# Patient Record
Sex: Female | Born: 1950 | Race: White | Hispanic: No | Marital: Single | Smoking: Never smoker
Health system: Southern US, Community
[De-identification: ages and names within clinical notes are randomized; demographics above are authoritative.]

## PROBLEM LIST (undated history)

## (undated) DIAGNOSIS — J45909 Unspecified asthma, uncomplicated: Secondary | ICD-10-CM

## (undated) DIAGNOSIS — F329 Major depressive disorder, single episode, unspecified: Secondary | ICD-10-CM

## (undated) DIAGNOSIS — R55 Syncope and collapse: Secondary | ICD-10-CM

## (undated) DIAGNOSIS — R9431 Abnormal electrocardiogram [ECG] [EKG]: Secondary | ICD-10-CM

## (undated) DIAGNOSIS — I10 Essential (primary) hypertension: Secondary | ICD-10-CM

## (undated) DIAGNOSIS — I639 Cerebral infarction, unspecified: Secondary | ICD-10-CM

## (undated) DIAGNOSIS — G4730 Sleep apnea, unspecified: Secondary | ICD-10-CM

## (undated) DIAGNOSIS — M1990 Unspecified osteoarthritis, unspecified site: Secondary | ICD-10-CM

## (undated) DIAGNOSIS — R569 Unspecified convulsions: Secondary | ICD-10-CM

## (undated) DIAGNOSIS — E119 Type 2 diabetes mellitus without complications: Secondary | ICD-10-CM

## (undated) DIAGNOSIS — D496 Neoplasm of unspecified behavior of brain: Secondary | ICD-10-CM

## (undated) DIAGNOSIS — F410 Panic disorder [episodic paroxysmal anxiety] without agoraphobia: Secondary | ICD-10-CM

## (undated) DIAGNOSIS — K219 Gastro-esophageal reflux disease without esophagitis: Secondary | ICD-10-CM

## (undated) DIAGNOSIS — F32A Depression, unspecified: Secondary | ICD-10-CM

## (undated) DIAGNOSIS — F419 Anxiety disorder, unspecified: Secondary | ICD-10-CM

## (undated) HISTORY — PX: KNEE SURGERY: SHX244

## (undated) HISTORY — PX: TRIGGER FINGER RELEASE: SHX641

## (undated) HISTORY — PX: CHOLECYSTECTOMY: SHX55

## (undated) HISTORY — PX: CSF SHUNT: SHX92

## (undated) HISTORY — PX: CARPAL TUNNEL RELEASE: SHX101

## (undated) HISTORY — PX: TONSILLECTOMY: SUR1361

---

## 2004-09-17 ENCOUNTER — Emergency Department: Payer: Self-pay | Admitting: Emergency Medicine

## 2004-11-18 ENCOUNTER — Other Ambulatory Visit: Payer: Self-pay

## 2005-01-25 ENCOUNTER — Inpatient Hospital Stay: Payer: Self-pay | Admitting: Unknown Physician Specialty

## 2005-01-28 ENCOUNTER — Encounter: Payer: Self-pay | Admitting: Internal Medicine

## 2005-04-26 ENCOUNTER — Ambulatory Visit: Payer: Self-pay

## 2007-05-23 ENCOUNTER — Ambulatory Visit: Payer: Self-pay

## 2009-01-07 ENCOUNTER — Ambulatory Visit: Payer: Self-pay

## 2012-04-14 ENCOUNTER — Emergency Department: Payer: Self-pay | Admitting: *Deleted

## 2012-04-14 LAB — COMPREHENSIVE METABOLIC PANEL
Albumin: 4.1 g/dL (ref 3.4–5.0)
Alkaline Phosphatase: 123 U/L (ref 50–136)
Calcium, Total: 9.7 mg/dL (ref 8.5–10.1)
Chloride: 103 mmol/L (ref 98–107)
EGFR (African American): >60
Potassium: 4.1 mmol/L (ref 3.5–5.1)
SGOT(AST): 42 U/L — ABNORMAL HIGH (ref 15–37)
SGPT (ALT): 38 U/L (ref 12–78)
Sodium: 138 mmol/L (ref 136–145)
Total Protein: 8.2 g/dL (ref 6.4–8.2)

## 2012-04-14 LAB — CBC WITH DIFFERENTIAL/PLATELET
Basophil #: 0 10*3/uL (ref 0.0–0.1)
Basophil %: 0.6 %
Eosinophil #: 0.3 10*3/uL (ref 0.0–0.7)
Eosinophil %: 4.1 %
HCT: 39.2 % (ref 35.0–47.0)
HGB: 13.1 g/dL (ref 12.0–16.0)
Lymphocyte #: 1.7 10*3/uL (ref 1.0–3.6)
MCH: 31 pg (ref 26.0–34.0)
MCHC: 33.3 g/dL (ref 32.0–36.0)
MCV: 93 fL (ref 80–100)
Monocyte #: 0.6 x10 3/mm (ref 0.2–0.9)
Monocyte %: 8.9 %
Neutrophil #: 4.5 10*3/uL (ref 1.4–6.5)
Neutrophil %: 62.6 %
RBC: 4.21 10*6/uL (ref 3.80–5.20)
WBC: 7.2 10*3/uL (ref 3.6–11.0)

## 2012-04-14 LAB — URINALYSIS, COMPLETE
Bacteria: NONE SEEN
Glucose,UR: NEGATIVE mg/dL (ref 0–75)
Ketone: NEGATIVE
Protein: NEGATIVE
Specific Gravity: 1.006 (ref 1.003–1.030)
WBC UR: 1 /HPF (ref 0–5)

## 2012-04-14 LAB — TROPONIN I: Troponin-I: <0.02 ng/mL

## 2012-04-14 LAB — LIPASE, BLOOD: Lipase: 181 U/L (ref 73–393)

## 2014-02-28 ENCOUNTER — Ambulatory Visit: Payer: Self-pay | Admitting: Internal Medicine

## 2014-06-03 ENCOUNTER — Ambulatory Visit: Payer: Self-pay | Admitting: Internal Medicine

## 2014-11-24 ENCOUNTER — Other Ambulatory Visit: Payer: Self-pay

## 2014-11-24 NOTE — Patient Outreach (Signed)
[facility]) Care Management  11/24/2014  Sharon Kirby 06/19/1951 [id number]   RN CM spoke with patient about a referral from [facility].  The referral stated patient needed assistance with finding a physician in [place] that accepts [employer].  Patient was going to a [facility] facility that no longer accepts her insurance.  Patient states that is not a problem now because her primary physician has made referrals to a urologist and orthopedic that accepts her insurance.  RN CM informed patient that she could call the customer service number on her insurance card for information on physicians that accept her insurance.  RN CM explained to patient about the services of [facility].  Patient has diabetes as a chronic conditions.  Patient states she knows how to self-manage her diabetes.  Patient decline the services of [facility].  RN CM will close this case and notify primary physician.    Maury Dus, RN, Ishmael Holter, [place] Telephonic Care Coordinator [phone]

## 2014-11-25 NOTE — Patient Outreach (Signed)
[facility]) Care Management  11/25/2014  Sharon Kirby 11/17/1950 [id number]   Received notification from Maury Dus, RN to close case due to consumer is eligible for program but refused to participate.  Ronnell Freshwater. [place] CM Assistant Phone: [phone] Fax: [phone]

## 2015-03-09 NOTE — Patient Outreach (Signed)
[name]) Care Management  03/09/2015  Sharon Kirby [birth year] [id number]   Referral from [place], assigned to Sherrin Daisy, [facility] for patient outreach.  Rahmon Heigl L. Aalijah Lanphere, [place] Care Management Assistant

## 2015-04-09 ENCOUNTER — Inpatient Hospital Stay
Admission: EM | Admit: 2015-04-09 | Discharge: 2015-04-13 | DRG: 300 | Disposition: A | Discharge status: Home / Self Care | Payer: Commercial Managed Care - HMO | Attending: Internal Medicine | Admitting: Internal Medicine

## 2015-04-09 ENCOUNTER — Encounter: Payer: Self-pay | Admitting: *Deleted

## 2015-04-09 DIAGNOSIS — E785 Hyperlipidemia, unspecified: Secondary | ICD-10-CM | POA: Diagnosis present

## 2015-04-09 DIAGNOSIS — F419 Anxiety disorder, unspecified: Secondary | ICD-10-CM | POA: Diagnosis present

## 2015-04-09 DIAGNOSIS — J45909 Unspecified asthma, uncomplicated: Secondary | ICD-10-CM | POA: Diagnosis present

## 2015-04-09 DIAGNOSIS — T39395A Adverse effect of other nonsteroidal anti-inflammatory drugs [NSAID], initial encounter: Secondary | ICD-10-CM | POA: Diagnosis present

## 2015-04-09 DIAGNOSIS — Z880 Allergy status to penicillin: Secondary | ICD-10-CM

## 2015-04-09 DIAGNOSIS — K5520 Angiodysplasia of colon without hemorrhage: Secondary | ICD-10-CM | POA: Diagnosis not present

## 2015-04-09 DIAGNOSIS — K219 Gastro-esophageal reflux disease without esophagitis: Secondary | ICD-10-CM | POA: Diagnosis present

## 2015-04-09 DIAGNOSIS — M1990 Unspecified osteoarthritis, unspecified site: Secondary | ICD-10-CM | POA: Diagnosis present

## 2015-04-09 DIAGNOSIS — D496 Neoplasm of unspecified behavior of brain: Secondary | ICD-10-CM | POA: Diagnosis present

## 2015-04-09 DIAGNOSIS — D509 Iron deficiency anemia, unspecified: Secondary | ICD-10-CM | POA: Insufficient documentation

## 2015-04-09 DIAGNOSIS — Z8673 Personal history of transient ischemic attack (TIA), and cerebral infarction without residual deficits: Secondary | ICD-10-CM | POA: Diagnosis not present

## 2015-04-09 DIAGNOSIS — G8929 Other chronic pain: Secondary | ICD-10-CM | POA: Diagnosis present

## 2015-04-09 DIAGNOSIS — M25512 Pain in left shoulder: Secondary | ICD-10-CM | POA: Diagnosis present

## 2015-04-09 DIAGNOSIS — Q2733 Arteriovenous malformation of digestive system vessel: Secondary | ICD-10-CM | POA: Diagnosis not present

## 2015-04-09 DIAGNOSIS — K2970 Gastritis, unspecified, without bleeding: Secondary | ICD-10-CM | POA: Diagnosis present

## 2015-04-09 DIAGNOSIS — K922 Gastrointestinal hemorrhage, unspecified: Secondary | ICD-10-CM | POA: Diagnosis present

## 2015-04-09 DIAGNOSIS — I10 Essential (primary) hypertension: Secondary | ICD-10-CM | POA: Diagnosis present

## 2015-04-09 DIAGNOSIS — E119 Type 2 diabetes mellitus without complications: Secondary | ICD-10-CM | POA: Diagnosis present

## 2015-04-09 DIAGNOSIS — F329 Major depressive disorder, single episode, unspecified: Secondary | ICD-10-CM | POA: Diagnosis present

## 2015-04-09 DIAGNOSIS — Z882 Allergy status to sulfonamides status: Secondary | ICD-10-CM | POA: Diagnosis not present

## 2015-04-09 DIAGNOSIS — K449 Diaphragmatic hernia without obstruction or gangrene: Secondary | ICD-10-CM | POA: Diagnosis present

## 2015-04-09 DIAGNOSIS — Z794 Long term (current) use of insulin: Secondary | ICD-10-CM | POA: Diagnosis not present

## 2015-04-09 DIAGNOSIS — G4733 Obstructive sleep apnea (adult) (pediatric): Secondary | ICD-10-CM | POA: Diagnosis present

## 2015-04-09 DIAGNOSIS — Z6841 Body Mass Index (BMI) 40.0 and over, adult: Secondary | ICD-10-CM

## 2015-04-09 DIAGNOSIS — R569 Unspecified convulsions: Secondary | ICD-10-CM | POA: Diagnosis present

## 2015-04-09 DIAGNOSIS — Z791 Long term (current) use of non-steroidal anti-inflammatories (NSAID): Secondary | ICD-10-CM | POA: Diagnosis not present

## 2015-04-09 DIAGNOSIS — F410 Panic disorder [episodic paroxysmal anxiety] without agoraphobia: Secondary | ICD-10-CM | POA: Diagnosis present

## 2015-04-09 DIAGNOSIS — D62 Acute posthemorrhagic anemia: Secondary | ICD-10-CM | POA: Diagnosis present

## 2015-04-09 DIAGNOSIS — D649 Anemia, unspecified: Secondary | ICD-10-CM | POA: Diagnosis not present

## 2015-04-09 DIAGNOSIS — Z9119 Patient's noncompliance with other medical treatment and regimen: Secondary | ICD-10-CM | POA: Diagnosis present

## 2015-04-09 DIAGNOSIS — D500 Iron deficiency anemia secondary to blood loss (chronic): Secondary | ICD-10-CM | POA: Diagnosis present

## 2015-04-09 HISTORY — DX: Gastro-esophageal reflux disease without esophagitis: K21.9

## 2015-04-09 HISTORY — DX: Type 2 diabetes mellitus without complications: E11.9

## 2015-04-09 HISTORY — DX: Cerebral infarction, unspecified: I63.9

## 2015-04-09 HISTORY — DX: Depression, unspecified: F32.A

## 2015-04-09 HISTORY — DX: Panic disorder (episodic paroxysmal anxiety): F41.0

## 2015-04-09 HISTORY — DX: Essential (primary) hypertension: I10

## 2015-04-09 HISTORY — DX: Neoplasm of unspecified behavior of brain: D49.6

## 2015-04-09 HISTORY — DX: Anxiety disorder, unspecified: F41.9

## 2015-04-09 HISTORY — DX: Abnormal electrocardiogram (ECG) (EKG): R94.31

## 2015-04-09 HISTORY — DX: Unspecified osteoarthritis, unspecified site: M19.90

## 2015-04-09 HISTORY — DX: Major depressive disorder, single episode, unspecified: F32.9

## 2015-04-09 HISTORY — DX: Unspecified convulsions: R56.9

## 2015-04-09 HISTORY — DX: Sleep apnea, unspecified: G47.30

## 2015-04-09 HISTORY — DX: Unspecified asthma, uncomplicated: J45.909

## 2015-04-09 HISTORY — DX: Syncope and collapse: R55

## 2015-04-09 LAB — BASIC METABOLIC PANEL
ANION GAP: 8 (ref 5–15)
BUN: 8 mg/dL (ref 6–20)
CALCIUM: 8.9 mg/dL (ref 8.9–10.3)
CO2: 25 mmol/L (ref 22–32)
Chloride: 104 mmol/L (ref 101–111)
Creatinine, Ser: 0.52 mg/dL (ref 0.44–1.00)
Glucose, Bld: 94 mg/dL (ref 65–99)
POTASSIUM: 4 mmol/L (ref 3.5–5.1)
SODIUM: 137 mmol/L (ref 135–145)

## 2015-04-09 LAB — GLUCOSE, CAPILLARY
GLUCOSE-CAPILLARY: 83 mg/dL (ref 65–99)
Glucose-Capillary: 145 mg/dL — ABNORMAL HIGH (ref 65–99)

## 2015-04-09 LAB — CBC WITH DIFFERENTIAL/PLATELET
BASOS ABS: 0.1 10*3/uL (ref 0–0.1)
BASOS PCT: 1 %
EOS ABS: 0.2 10*3/uL (ref 0–0.7)
EOS PCT: 4 %
HCT: 18.3 % — ABNORMAL LOW (ref 35.0–47.0)
Hemoglobin: 5.3 g/dL — ABNORMAL LOW (ref 12.0–16.0)
LYMPHS PCT: 23 %
Lymphs Abs: 1 10*3/uL (ref 1.0–3.6)
MCH: 19.1 pg — ABNORMAL LOW (ref 26.0–34.0)
MCHC: 28.8 g/dL — ABNORMAL LOW (ref 32.0–36.0)
MCV: 66.5 fL — ABNORMAL LOW (ref 80.0–100.0)
Monocytes Absolute: 0.7 10*3/uL (ref 0.2–0.9)
Monocytes Relative: 15 %
Neutro Abs: 2.6 10*3/uL (ref 1.4–6.5)
Neutrophils Relative %: 57 %
PLATELETS: 167 10*3/uL (ref 150–440)
RBC: 2.75 MIL/uL — AB (ref 3.80–5.20)
RDW: 21.1 % — AB (ref 11.5–14.5)
WBC: 4.5 10*3/uL (ref 3.6–11.0)

## 2015-04-09 LAB — ABO/RH: ABO/RH(D): O POS

## 2015-04-09 LAB — PREPARE RBC (CROSSMATCH)

## 2015-04-09 LAB — HEPATIC FUNCTION PANEL
ALT: 22 U/L (ref 14–54)
AST: 49 U/L — AB (ref 15–41)
Albumin: 3.7 g/dL (ref 3.5–5.0)
Alkaline Phosphatase: 134 U/L — ABNORMAL HIGH (ref 38–126)
BILIRUBIN DIRECT: 0.2 mg/dL (ref 0.1–0.5)
BILIRUBIN INDIRECT: 0.8 mg/dL (ref 0.3–0.9)
BILIRUBIN TOTAL: 1 mg/dL (ref 0.3–1.2)
Total Protein: 7.2 g/dL (ref 6.5–8.1)

## 2015-04-09 MED ORDER — INSULIN ASPART 100 UNIT/ML ~~LOC~~ SOLN
0.0000 [IU] | Freq: Three times a day (TID) | SUBCUTANEOUS | Status: DC
  Administered 2015-04-09: 1 [IU] via SUBCUTANEOUS
  Administered 2015-04-11: 2 [IU] via SUBCUTANEOUS
  Administered 2015-04-11 – 2015-04-13 (×4): 1 [IU] via SUBCUTANEOUS
  Filled 2015-04-09 (×2): qty 1
  Filled 2015-04-09: qty 2
  Filled 2015-04-09 (×4): qty 1

## 2015-04-09 MED ORDER — SODIUM CHLORIDE 0.9 % IV SOLN
10.0000 mL/h | Freq: Once | INTRAVENOUS | Status: AC
  Administered 2015-04-09: 10 mL/h via INTRAVENOUS

## 2015-04-09 MED ORDER — ONDANSETRON HCL 4 MG/2ML IJ SOLN
4.0000 mg | Freq: Four times a day (QID) | INTRAMUSCULAR | Status: DC | PRN
Start: 2015-04-09 — End: 2015-04-13
  Administered 2015-04-10 – 2015-04-12 (×2): 4 mg via INTRAVENOUS
  Filled 2015-04-09: qty 2

## 2015-04-09 MED ORDER — SENNOSIDES-DOCUSATE SODIUM 8.6-50 MG PO TABS
1.0000 | ORAL_TABLET | Freq: Every evening | ORAL | Status: DC | PRN
  Administered 2015-04-13: 1 via ORAL
  Filled 2015-04-09 (×2): qty 1

## 2015-04-09 MED ORDER — HYDROCODONE-ACETAMINOPHEN 5-325 MG PO TABS
1.0000 | ORAL_TABLET | ORAL | Status: DC | PRN
  Administered 2015-04-09 – 2015-04-10 (×2): 1 via ORAL
  Administered 2015-04-11 – 2015-04-12 (×4): 2 via ORAL
  Filled 2015-04-09: qty 1
  Filled 2015-04-09 (×3): qty 2
  Filled 2015-04-09: qty 1
  Filled 2015-04-09: qty 2

## 2015-04-09 MED ORDER — ACETAMINOPHEN 325 MG PO TABS
650.0000 mg | ORAL_TABLET | Freq: Four times a day (QID) | ORAL | Status: DC | PRN
  Administered 2015-04-11: 650 mg via ORAL
  Filled 2015-04-09: qty 2

## 2015-04-09 MED ORDER — ACETAMINOPHEN 650 MG RE SUPP: 650.0000 mg | Freq: Four times a day (QID) | RECTAL | Status: DC | PRN

## 2015-04-09 MED ORDER — PEG 3350-KCL-NA BICARB-NACL 420 G PO SOLR
4000.0000 mL | Freq: Once | ORAL | Status: AC
  Administered 2015-04-09: 4000 mL via ORAL
  Filled 2015-04-09: qty 4000

## 2015-04-09 MED ORDER — ONDANSETRON HCL 4 MG PO TABS: 4.0000 mg | ORAL_TABLET | Freq: Four times a day (QID) | ORAL | Status: DC | PRN

## 2015-04-09 MED ORDER — SODIUM CHLORIDE 0.9 % IV SOLN
INTRAVENOUS | Status: DC
  Administered 2015-04-09 – 2015-04-10 (×3): via INTRAVENOUS

## 2015-04-09 NOTE — ED Notes (Signed)
Per patient's report, patient had labs drawn today and hemoglobin was under 6.

## 2015-04-09 NOTE — Consult Note (Signed)
[facility]  [address], [place], [place] [address] Phone: [phone] Fax : [phone]  Consultation  Referring Provider:     No ref. provider found Primary Care Physician:  Madelyn Brunner, MD Primary Gastroenterologist:  Dr. Vira Agar         Reason for Consultation:     Anemia  Date of Admission:  04/09/2015 Date of Consultation:  04/09/2015         HPI:   Sharon Kirby is a 64 y.o. female who comes in today with a history of having itching which brought her to see her primary care provider. Upon seeing her primary care provider the patient was sent for blood work. The patient then left the doctor's office and was called to come back to the hospital because her hemoglobin was found to be 5.1. The patient was seen in the emergency room and repeat hemoglobin was sent which showed her hemoglobin to be 5.3. The patient was then admitted to the hospital and GI consult was called. The patient denies any black stools or bloody stools although she states that she sometimes sees some bright red blood per rectum when she has hemorrhoids. The patient reports that her last colonoscopy was approximately 15 years ago by Dr. Vira Agar. She also reports that she has been taking ibuprofen 800 mg 2-3 times a day for chronic left shoulder pain. The patient reports that her brother had colon cancer.   Past Medical History  Diagnosis Date  . Seizures   . Stroke   . Brain tumor   . Diabetes mellitus without complication   . Arthritis   . Sleep apnea   . GERD (gastroesophageal reflux disease)   . Asthma   . Long QT interval   . Hypertension   . Panic attacks   . Anxiety   . Depression   . Syncope     Past Surgical History  Procedure Laterality Date  . Csf shunt      x2 due to tumor  . Cholecystectomy    . Tonsillectomy    . Carpal tunnel release Left   . Trigger finger release Left   . Knee surgery Left     Prior to Admission medications   Not on File    No  family history on file.   Social History  Substance Use Topics  . Smoking status: Never Smoker   . Smokeless tobacco: None  . Alcohol Use: No    Allergies as of 04/09/2015 - Review Complete 04/09/2015  Allergen Reaction Noted  . Sulfa antibiotics Anaphylaxis 04/09/2015  . Penicillins Rash 04/09/2015    Review of Systems:    All systems reviewed and negative except where noted in HPI.   Physical Exam:  Vital signs in last 24 hours: Temp:  [97.7 F (36.5 C)-98.3 F (36.8 C)] 98.3 F (36.8 C) (09/22 1609) Pulse Rate:  [87-111] 91 (09/22 1609) Resp:  [13-26] 18 (09/22 1609) BP: (112-140)/(58-74) 112/58 mmHg (09/22 1609) SpO2:  [97 %-100 %] 98 % (09/22 1609) Weight:  [319 lb (144.697 kg)] 319 lb (144.697 kg) (09/22 1230)   General:   Pleasant, cooperative in NAD, obese at 319 pounds  Head:  Normocephalic and atraumatic. Eyes:   No icterus.   Conjunctiva pink. PERRLA. Ears:  Normal auditory acuity. Neck:  Supple; no masses or thyroidomegaly Lungs: Respirations even and unlabored. Lungs clear to auscultation bilaterally.   No wheezes, crackles, or rhonchi.  Heart:  Regular rate and rhythm;  Without murmur, clicks, rubs or gallops Abdomen:  Soft, nondistended, nontender. Normal bowel sounds. No appreciable masses or hepatomegaly.  No rebound or guarding.  Rectal:  Not performed. Msk:  Symmetrical without gross deformities.  Strength  Extremities:  Without edema, cyanosis or clubbing. Neurologic:  Alert and oriented x3;  grossly normal neurologically. Skin:  Intact without significant lesions or rashes. Cervical Nodes:  No significant cervical adenopathy. Psych:  Alert and cooperative. Normal affect.  LAB RESULTS:  Recent Labs  04/09/15 1321  WBC 4.5  HGB 5.3*  HCT 18.3*  PLT 167   BMET  Recent Labs  04/09/15 1321  NA 137  K 4.0  CL 104  CO2 25  GLUCOSE 94  BUN 8  CREATININE 0.52  CALCIUM 8.9   LFT  Recent Labs  04/09/15 1321  PROT 7.2  ALBUMIN 3.7    AST 49*  ALT 22  ALKPHOS 134*  BILITOT 1.0  BILIDIR 0.2  IBILI 0.8   PT/INR No results for input(s): LABPROT, INR in the last 72 hours.  STUDIES: No results found.    Impression / Plan:   Sharon Kirby is a 64 y.o. y/o female with anwr emia of unknown origin. The patient has been on antibiotics or medication and she also hasn't had a colonoscopy 15 years with a history of a brother having colon cancer. The patient will be set up for an EGD and colonoscopy for tomorrow. The patient has been explained the plan and agrees with it.  Thank you for involving me in the care of this patient.      LOS: 0 days   Ollen Bowl, MD  04/09/2015, 4:39 PM   Note: This dictation was prepared with Dragon dictation along with smaller phrase technology. Any transcriptional errors that result from this process are unintentional.

## 2015-04-09 NOTE — ED Provider Notes (Signed)
[facility] Emergency Department Provider Note   ____________________________________________  Time seen: 1250  I have reviewed the triage vital signs and the nursing notes.   HISTORY  Chief Complaint Anemia   History limited by: Not Limited   HPI Sharon Kirby is a 64 y.o. female who presents to the emergency department today at the request of her primary care doctor because of low hemoglobin found on blood today. Patient states she has been feeling more and progressively weak for the past couple of months. It is gone to the point where she has been hard time walking and is been using her scooter. She feels weakness globally. She went to see her primary care doctor today because of itchiness. She states that that got bad yesterday. She has also noticed in the past couple of days bright red blood per rectum. 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 She denies any rectal pain. Denies any fevers, chest pain or shortness breath.     Past Medical History  Diagnosis Date  . Seizures   . Stroke   . Brain tumor   . Diabetes mellitus without complication   . Arthritis   . Sleep apnea   . GERD (gastroesophageal reflux disease)   . Asthma   . Long QT interval   . Hypertension   . Panic attacks   . Anxiety   . Depression   . Syncope     There are no active problems to display for this patient.   Past Surgical History  Procedure Laterality Date  . Csf shunt      x2 due to tumor  . Cholecystectomy    . Tonsillectomy    . Carpal tunnel release Left   . Trigger finger release Left   . Knee surgery Left     No current outpatient prescriptions on file.  Allergies Sulfa antibiotics and Penicillins  No family history on file.  Social History Social History  Substance Use Topics  . Smoking status: Never Smoker   . Smokeless tobacco: None  . Alcohol Use: No    Review of Systems  Constitutional: Negative for fever. Cardiovascular:  Negative for chest pain. Respiratory: Negative for shortness of breath. Gastrointestinal: Negative for abdominal pain, vomiting and diarrhea. Positive for bright red blood per rectum Genitourinary: Negative for dysuria. Musculoskeletal: Negative for back pain. Skin: Negative for rash. Positive for pruritus Neurological: Negative for headaches, focal weakness or numbness.   10-point ROS otherwise negative.  ____________________________________________   PHYSICAL EXAM:  VITAL SIGNS: ED Triage Vitals  Enc Vitals Group     BP 04/09/15 1230 137/64 mmHg     Pulse Rate 04/09/15 1230 87     Resp 04/09/15 1230 18     Temp 04/09/15 1230 98.2 F (36.8 C)     Temp Source 04/09/15 1230 Oral     SpO2 04/09/15 1230 100 %     Weight 04/09/15 1230 319 lb (144.697 kg)     Height 04/09/15 1230 5' (1.524 m)     Head Cir --      Peak Flow --      Pain Score 04/09/15 1231 8   Constitutional: Alert and oriented. Well appearing and in no distress. Eyes: Pale conjunctiva. PERRL. Normal extraocular movements. ENT   Head: Normocephalic and atraumatic.   Nose: No congestion/rhinnorhea.   Mouth/Throat: Mucous membranes are moist.   Neck: No stridor. Hematological/Lymphatic/Immunilogical: No cervical lymphadenopathy. Cardiovascular: Normal rate, regular rhythm.  No murmurs, rubs, or  gallops. Respiratory: Normal respiratory effort without tachypnea nor retractions. Breath sounds are clear and equal bilaterally. No wheezes/rales/rhonchi. Gastrointestinal: Soft and nontender. No distention. There is no CVA tenderness. Rectal: Light brown blood on glove. GUIAC positive.  Genitourinary: Deferred Musculoskeletal: Normal range of motion in all extremities. No joint effusions.  No lower extremity tenderness nor edema. Neurologic:  Normal speech and language. No gross focal neurologic deficits are appreciated. Speech is normal.  Skin:  Skin is warm, dry and intact. No rash noted. Psychiatric:  Mood and affect are normal. Speech and behavior are normal. Patient exhibits appropriate insight and judgment.  ____________________________________________    LABS (pertinent positives/negatives)  Labs Reviewed  CBC WITH DIFFERENTIAL/PLATELET - Abnormal; Notable for the following:    RBC 2.75 (*)    Hemoglobin 5.3 (*)    HCT 18.3 (*)    MCV 66.5 (*)    MCH 19.1 (*)    MCHC 28.8 (*)    RDW 21.1 (*)    All other components within normal limits  HEPATIC FUNCTION PANEL - Abnormal; Notable for the following:    AST 49 (*)    Alkaline Phosphatase 134 (*)    All other components within normal limits  BASIC METABOLIC PANEL  TYPE AND SCREEN  PREPARE RBC (CROSSMATCH)  ABO/RH     ____________________________________________   EKG  I, Nance Pear, attending physician, personally viewed and interpreted this EKG  EKG Time: 1323 Rate: 86 Rhythm: normal sinus rhythm Axis: left axis deviation Intervals: qtc 512 QRS: RBBB ST changes: no st elevation Impression: abnormal EKG ____________________________________________    RADIOLOGY  None  ____________________________________________   PROCEDURES  Procedure(s) performed: None  Critical Care performed: Yes, see critical care note(s)  CRITICAL CARE Performed by: Nance Pear   Total critical care time: 35  Critical care time was exclusive of separately billable procedures and treating other patients.  Critical care was necessary to treat or prevent imminent or life-threatening deterioration.  Critical care was time spent personally by me on the following activities: development of treatment plan with patient and/or surrogate as well as nursing, discussions with consultants, evaluation of patient's response to treatment, examination of patient, obtaining history from patient or surrogate, ordering and performing treatments and interventions, ordering and review of laboratory studies, ordering and review of  radiographic studies, pulse oximetry and re-evaluation of patient's condition.  ____________________________________________   INITIAL IMPRESSION / ASSESSMENT AND PLAN / ED COURSE  Pertinent labs & imaging results that were available during my care of the patient were reviewed by me and considered in my medical decision making (see chart for details).  Patient presented to the emergency department today from primary care doctor's office because of concerns for low hemoglobin. Repeat blood work done in our emergency department confirms hemoglobin. Less than 6. Patient does have some weakness and fatigue however is non tachycardic and not hypotensive here. GUIAC positive without any gross blood on the stool. The patient was consented for a blood transfusion, and the blood transfusion was ordered. Patient admitted to the hospitalist service.   ____________________________________________   FINAL CLINICAL IMPRESSION(S) / ED DIAGNOSES  Final diagnoses:  Anemia, unspecified anemia type  Gastrointestinal hemorrhage, unspecified gastritis, unspecified gastrointestinal hemorrhage type     Nance Pear, MD 04/09/15 7494

## 2015-04-09 NOTE — H&P (Signed)
Sharon Kirby at [place] NAME: Sharon Kirby    MR#:  [id number]  DATE OF BIRTH:  02-09-1951  DATE OF ADMISSION:  04/09/2015  PRIMARY CARE PHYSICIAN: Madelyn Brunner, MD   REQUESTING/REFERRING PHYSICIAN: Dr. Archie Balboa  CHIEF COMPLAINT:  I will send because of abnormal blood work  HISTORY OF PRESENT ILLNESS:  Sharon Kirby  is a 64 y.o. female with a known history of obstructive sleep apnea noncompliant to CPAP, chronic left shoulder pain, GERD, morbid of S3, history of seizures history of brain tumor, anxiety depression panic attacks comes to the emergency room after she was asked by her primary care physician to go to the emergency room due to her low hemoglobin. Patient had seen Dr. Gilford Rile for some issues with itching on her skin. She was found to have routine blood work and was noted to have a bilirubin of 5.1. Repeat blood work in the emergency room showed a hemoglobin of 5.3. Patient reports taking ibuprofen 800 mg 2 times to 3 times a day for her chronic left shoulder pain. She sees orthopedic for local injections on on and off for last several months. Denies any melanotic stool. She does have symptoms of epigastric pain and some nausea. Patient does not remember getting EGD in the past.   PAST MEDICAL HISTORY:   Past Medical History  Diagnosis Date  . Seizures   . Stroke   . Brain tumor   . Diabetes mellitus without complication   . Arthritis   . Sleep apnea   . GERD (gastroesophageal reflux disease)   . Asthma   . Long QT interval   . Hypertension   . Panic attacks   . Anxiety   . Depression   . Syncope     PAST SURGICAL HISTOIRY:   Past Surgical History  Procedure Laterality Date  . Csf shunt      x2 due to tumor  . Cholecystectomy    . Tonsillectomy    . Carpal tunnel release Left   . Trigger finger release Left   . Knee surgery Left     SOCIAL HISTORY:   Social History  Substance Use Topics  .  Smoking status: Never Smoker   . Smokeless tobacco: Not on file  . Alcohol Use: No    FAMILY HISTORY:  No family history on file.  DRUG ALLERGIES:   Allergies  Allergen Reactions  . Sulfa Antibiotics Anaphylaxis  . Penicillins Rash    REVIEW OF SYSTEMS:  Review of Systems  Constitutional: Negative for fever, chills and diaphoresis.  HENT: Negative for congestion, ear pain, hearing loss, nosebleeds and sore throat.   Eyes: Negative for blurred vision, double vision, photophobia and pain.  Respiratory: Negative for hemoptysis, sputum production, wheezing and stridor.   Cardiovascular: Negative for orthopnea, claudication and leg swelling.  Gastrointestinal: Positive for melena. Negative for heartburn and abdominal pain.  Genitourinary: Negative for dysuria and frequency.  Musculoskeletal: Negative for back pain, joint pain and neck pain.  Skin: Negative for rash.  Neurological: Positive for weakness. Negative for tingling, sensory change, speech change, focal weakness, seizures and headaches.  Endo/Heme/Allergies: Does not bruise/bleed easily.  Psychiatric/Behavioral: Negative for memory loss and substance abuse. The patient is not nervous/anxious.   All other systems reviewed and are negative.   MEDICATIONS AT HOME:   Prior to Admission medications   Not on File      VITAL SIGNS:  Blood pressure 134/65, pulse 88, temperature  98.2 F (36.8 C), temperature source Oral, resp. rate 13, height 5' (1.524 m), weight 144.697 kg (319 lb), SpO2 97 %.  PHYSICAL EXAMINATION:  GENERAL:  64 y.o.-year-old patient lying in the bed with no acute distress. obese EYES: Pupils equal, round, reactive to light and accommodation. No scleral icterus. Extraocular muscles intact. Pallor+ HEENT: Head atraumatic, normocephalic. Oropharynx and nasopharynx clear.  NECK:  Supple, no jugular venous distention. No thyroid enlargement, no tenderness.  LUNGS: Normal breath sounds bilaterally, no  wheezing, rales,rhonchi or crepitation. No use of accessory muscles of respiration.  CARDIOVASCULAR: S1, S2 normal. No murmurs, rubs, or gallops.  ABDOMEN: Soft, nontender, nondistended. Bowel sounds present. No organomegaly or mass.  EXTREMITIES: No pedal edema, cyanosis, or clubbing.  NEUROLOGIC: Cranial nerves II through XII are intact. Muscle strength 5/5 in all extremities. Sensation intact. Gait not checked.  PSYCHIATRIC: The patient is alert and oriented x 3.  SKIN: No obvious rash, lesion, or ulcer.   LABORATORY PANEL:   CBC  Recent Labs Lab 04/09/15 1321  WBC 4.5  HGB 5.3*  HCT 18.3*  PLT 167   ------------------------------------------------------------------------------------------------------------------  Chemistries   Recent Labs Lab 04/09/15 1321  NA 137  K 4.0  CL 104  CO2 25  GLUCOSE 94  BUN 8  CREATININE 0.52  CALCIUM 8.9  AST 49*  ALT 22  ALKPHOS 134*  BILITOT 1.0   ------------------------------------------------------------------------------------------------------------------  Cardiac Enzymes No results for input(s): TROPONINI in the last 168 hours. ------------------------------------------------------------------------------------------------------------------  RADIOLOGY:  No results found.  EKG:  Sinus rhythm with PAC.  IMPRESSION AND PLAN:   64 year old Misty with past medical history of obstructive sleep apnea, anxiety depression panic attacks, GERD, osteoarthritis with chronic left shoulder pain comes to the emergency room with  1. GI bleed suspect upper in the setting of chronic N Sadie use   patient asymptomatic   hemoglobin of 5.3 Risk and benefits of blood transfusion discussed. Patient's family Consented Patient Aware of It. We'll Transfuse Couple Units of Blood Transfusion Today. IV Protonix Twice a Day. GI consult with Dr. Lottie Dawson HL Check hemoglobin Q 12.  2. Obstructive sleep apnea noncompliant to CPAP.  3. Type 2  diabetes we'll hold off on by mouth meds. Sliding scale insulin.  4. Hypertension continue lisinopril  5. Anxiety, depression, panic attacks Continue clonazepam, Paxil  6. Hyperlipidemia on Pravachol  7. DVT prophylaxis SCD and teds no antiplatelet agents given GI bleed   All the records are reviewed and case discussed with ED provider. Management plans discussed with the patient, family and they are in agreement.  discussed with Dr. Evangeline Gula WO HL GI  CODE STATUS:Full   TOTAL TIME TAKING CARE OF THIS PATIENT: 45  minutes.    Evanie Buckle M.D on 04/09/2015 at 3:04 PM  Between 7am to 6pm - Pager - [phone]  After 6pm go to [email or web address] - password [email or web address] [facility]  Office  [phone]  CC: Primary care physician; Madelyn Brunner, MD

## 2015-04-10 ENCOUNTER — Encounter
Admission: EM | Disposition: A | Discharge status: Home / Self Care | Payer: Self-pay | Source: Home / Self Care | Attending: Internal Medicine

## 2015-04-10 ENCOUNTER — Inpatient Hospital Stay: Payer: Commercial Managed Care - HMO | Admitting: Anesthesiology

## 2015-04-10 ENCOUNTER — Encounter: Payer: Self-pay | Admitting: *Deleted

## 2015-04-10 DIAGNOSIS — K5520 Angiodysplasia of colon without hemorrhage: Secondary | ICD-10-CM

## 2015-04-10 DIAGNOSIS — D509 Iron deficiency anemia, unspecified: Secondary | ICD-10-CM

## 2015-04-10 HISTORY — PX: COLONOSCOPY WITH PROPOFOL: SHX5780

## 2015-04-10 HISTORY — PX: ESOPHAGOGASTRODUODENOSCOPY (EGD) WITH PROPOFOL: SHX5813

## 2015-04-10 LAB — GLUCOSE, CAPILLARY
GLUCOSE-CAPILLARY: 79 mg/dL (ref 65–99)
GLUCOSE-CAPILLARY: 94 mg/dL (ref 65–99)
Glucose-Capillary: 90 mg/dL (ref 65–99)
Glucose-Capillary: 144 mg/dL — ABNORMAL HIGH (ref 65–99)

## 2015-04-10 LAB — CBC
HEMATOCRIT: 21.4 % — AB (ref 35.0–47.0)
HEMOGLOBIN: 6.4 g/dL — AB (ref 12.0–16.0)
MCH: 20.6 pg — AB (ref 26.0–34.0)
MCHC: 29.9 g/dL — AB (ref 32.0–36.0)
MCV: 68.9 fL — AB (ref 80.0–100.0)
Platelets: 159 10*3/uL (ref 150–440)
RBC: 3.11 MIL/uL — AB (ref 3.80–5.20)
RDW: 21.5 % — ABNORMAL HIGH (ref 11.5–14.5)
WBC: 5.3 10*3/uL (ref 3.6–11.0)

## 2015-04-10 LAB — PREPARE RBC (CROSSMATCH)

## 2015-04-10 SURGERY — COLONOSCOPY WITH PROPOFOL
Anesthesia: General

## 2015-04-10 MED ORDER — MIDAZOLAM HCL 2 MG/2ML IJ SOLN
INTRAMUSCULAR | Status: DC | PRN
  Administered 2015-04-10: 2 mg via INTRAVENOUS

## 2015-04-10 MED ORDER — IPRATROPIUM-ALBUTEROL 0.5-2.5 (3) MG/3ML IN SOLN
RESPIRATORY_TRACT | Status: AC
  Administered 2015-04-10: 3 mL via RESPIRATORY_TRACT
  Filled 2015-04-10: qty 3

## 2015-04-10 MED ORDER — SODIUM CHLORIDE 0.9 % IV SOLN
Freq: Once | INTRAVENOUS | Status: AC
  Administered 2015-04-10: via INTRAVENOUS

## 2015-04-10 MED ORDER — FENTANYL CITRATE (PF) 250 MCG/5ML IJ SOLN
INTRAMUSCULAR | Status: DC | PRN
  Administered 2015-04-10 (×2): 50 ug via INTRAVENOUS

## 2015-04-10 MED ORDER — IPRATROPIUM-ALBUTEROL 0.5-2.5 (3) MG/3ML IN SOLN
3.0000 mL | Freq: Once | RESPIRATORY_TRACT | Status: AC
  Administered 2015-04-10: 3 mL via RESPIRATORY_TRACT

## 2015-04-10 MED ORDER — PANTOPRAZOLE SODIUM 40 MG IV SOLR
40.0000 mg | Freq: Two times a day (BID) | INTRAVENOUS | Status: DC
  Administered 2015-04-10 – 2015-04-13 (×6): 40 mg via INTRAVENOUS
  Filled 2015-04-10 (×6): qty 40

## 2015-04-10 MED ORDER — PROPOFOL 10 MG/ML IV BOLUS
INTRAVENOUS | Status: DC | PRN
  Administered 2015-04-10 (×2): 20 mg via INTRAVENOUS
  Administered 2015-04-10: 30 mg via INTRAVENOUS
  Administered 2015-04-10 (×3): 20 mg via INTRAVENOUS

## 2015-04-10 MED ORDER — PROPOFOL 500 MG/50ML IV EMUL
INTRAVENOUS | Status: DC | PRN
  Administered 2015-04-10: 100 ug/kg/min via INTRAVENOUS

## 2015-04-10 MED ORDER — LIDOCAINE HCL (CARDIAC) 20 MG/ML IV SOLN
INTRAVENOUS | Status: DC | PRN
  Administered 2015-04-10: 60 mg via INTRAVENOUS

## 2015-04-10 NOTE — Progress Notes (Addendum)
[place] at [place] NAME: Sharon Kirby    MR#:  [id number]  DATE OF BIRTH:  [birth year]  SUBJECTIVE:  CHIEF COMPLAINT:   Chief Complaint  Patient presents with  . Anemia   She is still drowsy- under effect of sedation during procedures. Son in room- he said- she noticed Bright red blood in stool. And was feeling very weak. At her baseline walks with a cane or walker, and uses a scooter when go out.  Received 2 units PRBC yesterday.   REVIEW OF SYSTEMS:   Pt is drowsy, easily going to sleep after arousal so not able to give me ROS.  ROS  DRUG ALLERGIES:   Allergies  Allergen Reactions  . Sulfa Antibiotics Anaphylaxis  . Penicillins Rash    VITALS:  Blood pressure 116/59, pulse 98, temperature 99.6 F (37.6 C), temperature source Tympanic, resp. rate 14, height 5' (1.524 m), weight 144.697 kg (319 lb), SpO2 93 %.  PHYSICAL EXAMINATION:  GENERAL:  64 y.o.-year-old obase patient lying in the bed with no acute distress. Sleepy.  EYES: Pupils equal, round, reactive to light . No scleral icterus.  HEENT: Head atraumatic, normocephalic. Oropharynx and nasopharynx clear.  NECK:  Supple, no jugular venous distention. No thyroid enlargement, no tenderness.  LUNGS: Normal breath sounds bilaterally, no wheezing, rales,rhonchi or crepitation. No use of accessory muscles of respiration.  CARDIOVASCULAR: S1, S2 normal. No murmurs.  ABDOMEN: Soft, nontender, nondistended. Bowel sounds present. No organomegaly or mass.  EXTREMITIES:positive for chronic pedal edema, no cyanosis, or clubbing.  NEUROLOGIC: Cranial nerves II through XII are intact. Muscle strength 4/5 in all extremities. Sensation intact. Gait not checked.  PSYCHIATRIC: The patient is drowsy, and have very short arousal spells on stimuli.  SKIN: No obvious rash, lesion, or ulcer.   Physical Exam LABORATORY PANEL:   CBC  Recent Labs Lab 04/10/15 0156  WBC 5.3  HGB  6.4*  HCT 21.4*  PLT 159   ------------------------------------------------------------------------------------------------------------------  Chemistries   Recent Labs Lab 04/09/15 1321  NA 137  K 4.0  CL 104  CO2 25  GLUCOSE 94  BUN 8  CREATININE 0.52  CALCIUM 8.9  AST 49*  ALT 22  ALKPHOS 134*  BILITOT 1.0   ------------------------------------------------------------------------------------------------------------------  Cardiac Enzymes No results for input(s): TROPONINI in the last 168 hours. ------------------------------------------------------------------------------------------------------------------  RADIOLOGY:  No results found.  ASSESSMENT AND PLAN:   64 year old Sharon Kirby with past medical history of obstructive sleep apnea, anxiety depression panic attacks, GERD, osteoarthritis with chronic left shoulder pain comes to the emergency room with  1. GI bleed - acute anemia due to GI blood loss chronic NSAIDs use.  patient asymptomatic  hemoglobin of 5.3> 6.4 after 2 units PRBC transfusion ( 04/09/15). Risk and benefits of blood transfusion discussed. Patient's family ( son) Consented. We'll Transfuse one more Units of Blood Transfusion 04/10/15. IV Protonix Twice a Day. GI consult with Dr. Lottie Dawson [facility]- Endo and Colonoscopy done.  2. Obstructive sleep apnea noncompliant to CPAP.  3. Type 2 diabetes we'll hold off on by mouth meds. Sliding scale insulin.  4. Hypertension continue lisinopril  5. Anxiety, depression, panic attacks Continue clonazepam, Paxil  6. Hyperlipidemia on Pravachol  7. DVT prophylaxis SCD and teds no antiplatelet agents given GI bleed   All the records are reviewed and case discussed with Care Management/Social Workerr. Management plans discussed with the patient, family and they are in agreement.  CODE STATUS: Full TOTAL TIME TAKING CARE OF  THIS PATIENT: 35 minutes.     POSSIBLE D/C IN 1-2 DAYS, DEPENDING ON CLINICAL  CONDITION.  Pt is drowsy, so the findings and plan discussed with hes son in room.  Vaughan Basta M.D on 04/10/2015   Between 7am to 6pm - Pager - [phone]  After 6pm go to [email or web address] - password [email or web address] [facility]  Office  [phone]  CC: Primary care physician; Madelyn Brunner, MD  Note: This dictation was prepared with Dragon dictation along with smaller phrase technology. Any transcriptional errors that result from this process are unintentional.

## 2015-04-10 NOTE — Transfer of Care (Signed)
Immediate Anesthesia Transfer of Care Note  Patient: Sharon Kirby  Procedure(s) Performed: Procedure(s): COLONOSCOPY WITH PROPOFOL (N/A) ESOPHAGOGASTRODUODENOSCOPY (EGD) WITH PROPOFOL (N/A)  Patient Location: PACU  Anesthesia Type:MAC  Level of Consciousness: awake  Airway & Oxygen Therapy: Patient Spontanous Breathing  Post-op Assessment: Report given to RN  Post vital signs: Reviewed  Last Vitals:  Filed Vitals:   04/10/15 1321  BP: 115/55  Pulse: 100  Temp: 37.1 C  Resp: 16    Complications: No apparent anesthesia complications

## 2015-04-10 NOTE — Op Note (Signed)
[facility] Patient Name: Sharon Kirby Procedure Date: 04/10/2015 1:23 PM MRN: [id number] Account #: [id number] Date of Birth: 10-26-1950 Admit Type: Inpatient Age: 64 Room: [facility] ENDO ROOM 4 Gender: Female Note Status: Finalized Procedure:         Colonoscopy Indications:       Iron deficiency anemia Providers:         Lucilla Lame, MD Referring MD:      Hewitt Blade. Sarina Ser, MD (Referring MD) Medicines:         Propofol per Anesthesia Complications:     No immediate complications. Procedure:         Pre-Anesthesia Assessment:                    - Prior to the procedure, a History and Physical was                     performed, and patient medications and allergies were                     reviewed. The patient's tolerance of previous anesthesia                     was also reviewed. The risks and benefits of the procedure                     and the sedation options and risks were discussed with the                     patient. All questions were answered, and informed consent                     was obtained. Prior Anticoagulants: The patient has taken                     no previous anticoagulant or antiplatelet agents. ASA                     Grade Assessment: II - A patient with mild systemic                     disease. After reviewing the risks and benefits, the                     patient was deemed in satisfactory condition to undergo                     the procedure.                    After obtaining informed consent, the colonoscope was                     passed under direct vision. Throughout the procedure, the                     patient's blood pressure, pulse, and oxygen saturations                     were monitored continuously. The Colonoscope was                     introduced through the anus and advanced to the the cecum,  identified by appendiceal orifice and ileocecal valve. The   colonoscopy was performed without difficulty. The patient                     tolerated the procedure well. The quality of the bowel                     preparation was excellent. Findings:      The perianal and digital rectal examinations were normal.      Multiple large patchy angioectasias without bleeding were found in the       entire colon. Coagulation for hemostasis using argon beam at 2       liters/minute and 30 watts was successful. Impression:        - Multiple non-bleeding colonic angioectasias. Treated                     with argon beam coagulation.                    - No specimens collected. Recommendation:    - Return patient to hospital ward for ongoing care. Procedure Code(s): --- Professional ---                    [phone], Colonoscopy, flexible; with control of bleeding,                     any method Diagnosis Code(s): --- Professional ---                    D50.9, Iron deficiency anemia, unspecified                    K55.20, Angiodysplasia of colon without hemorrhage CPT copyright 2014 American Medical Association. All rights reserved. The codes documented in this report are preliminary and upon coder review may  be revised to meet current compliance requirements. Lucilla Lame, MD 04/10/2015 2:28:21 PM This report has been signed electronically. Number of Addenda: 0 Note Initiated On: 04/10/2015 1:23 PM Scope Withdrawal Time: 0 hours 17 minutes 13 seconds  Total Procedure Duration: 0 hours 30 minutes 21 seconds       [facility]

## 2015-04-10 NOTE — Clinical Documentation Improvement (Signed)
Hospitalist  Possible Conditions?   Acute Blood Loss Anemia, including the suspected or known cause or associated condition(s)  Acute on chronic blood loss anemia, including the suspected or known cause or associated condition(s)  Chronic blood loss anemia, including the suspected or known cause or associated condition(s)  Precipitous drop in Hematocrit, including the suspected or known cause or associated condition(s)  Other  Clinically Undetermined  Document any associated diagnoses/conditions.  Supporting Information:"GI bleed suspect upper in the setting of chronic N Sadie use"  Lab 04/09/15 1321 HGB 5.3*   Please exercise your independent, professional judgment when responding. A specific answer is not anticipated or expected.  Thank You, Alessandra Grout, RN, BSN, CCDS,Clinical Documentation Specialist:  [phone]  [phone]=[facility] Health Information Management

## 2015-04-10 NOTE — Progress Notes (Signed)
PT Cancellation Note  Patient Details Name: Sharon Kirby MRN: [id number] DOB: [birth year]   Cancelled Treatment:    Reason Eval/Treat Not Completed: Medical issues which prohibited therapy (Pt's Hgb 6.4 (04/10/15 at 0156); PT contraindicated for Hgb <7.1.)  Will hold PT at this time and re-attempt at a later date/time as medically appropriate.   Raquel Sarna Hauschild 04/10/2015, 8:24 AM Leitha Bleak, [place]

## 2015-04-10 NOTE — Op Note (Signed)
[facility] Patient Name: Modupe Shampine Procedure Date: 04/10/2015 1:22 PM MRN: [id number] Account #: [id number] Date of Birth: May 24, 1951 Admit Type: Inpatient Age: 64 Room: [facility] ENDO ROOM 4 Gender: Female Note Status: Finalized Procedure:         Upper GI endoscopy Indications:       Iron deficiency anemia Providers:         Lucilla Lame, MD Referring MD:      Hewitt Blade. Sarina Ser, MD (Referring MD) Medicines:         Propofol per Anesthesia Complications:     No immediate complications. Procedure:         Pre-Anesthesia Assessment:                    - Prior to the procedure, a History and Physical was                     performed, and patient medications and allergies were                     reviewed. The patient's tolerance of previous anesthesia                     was also reviewed. The risks and benefits of the procedure                     and the sedation options and risks were discussed with the                     patient. All questions were answered, and informed consent                     was obtained. Prior Anticoagulants: The patient has taken                     no previous anticoagulant or antiplatelet agents. ASA                     Grade Assessment: II - A patient with mild systemic                     disease. After reviewing the risks and benefits, the                     patient was deemed in satisfactory condition to undergo                     the procedure.                    After obtaining informed consent, the endoscope was passed                     under direct vision. Throughout the procedure, the                     patient's blood pressure, pulse, and oxygen saturations                     were monitored continuously. The Endoscope was introduced                     through the mouth, and advanced to the second part of  duodenum. The upper GI endoscopy was accomplished without      difficulty. The patient tolerated the procedure well. Findings:      A medium-sized hiatus hernia was present.      The stomach was normal.      The examined duodenum was normal. Impression:        - Medium-sized hiatus hernia.                    - Normal stomach.                    - Normal examined duodenum.                    - No specimens collected. Recommendation:    - Perform a colonoscopy today. Procedure Code(s): --- Professional ---                    [phone], Esophagogastroduodenoscopy, flexible, transoral;                     diagnostic, including collection of specimen(s) by                     brushing or washing, when performed (separate procedure) Diagnosis Code(s): --- Professional ---                    D50.9, Iron deficiency anemia, unspecified CPT copyright 2014 American Medical Association. All rights reserved. The codes documented in this report are preliminary and upon coder review may  be revised to meet current compliance requirements. Lucilla Lame, MD 04/10/2015 1:54:15 PM This report has been signed electronically. Number of Addenda: 0 Note Initiated On: 04/10/2015 1:22 PM      [facility]

## 2015-04-10 NOTE — Anesthesia Postprocedure Evaluation (Signed)
  Anesthesia Post-op Note  Patient: Malayasia A Doty  Procedure(s) Performed: Procedure(s): COLONOSCOPY WITH PROPOFOL (N/A) ESOPHAGOGASTRODUODENOSCOPY (EGD) WITH PROPOFOL (N/A)  Anesthesia type:General  Patient location: PACU  Post pain: Pain level controlled  Post assessment: Post-op Vital signs reviewed, Patient's Cardiovascular Status Stable, Respiratory Function Stable, Patent Airway and No signs of Nausea or vomiting  Post vital signs: Reviewed and stable  Last Vitals:  Filed Vitals:   04/10/15 1505  BP: 116/59  Pulse: 98  Temp:   Resp: 14    Level of consciousness: awake, alert  and patient cooperative  Complications: No apparent anesthesia complications

## 2015-04-10 NOTE — Anesthesia Preprocedure Evaluation (Signed)
Anesthesia Evaluation  Patient identified by MRN, date of birth, ID band Patient awake    Reviewed: Allergy & Precautions, H&P , NPO status , Patient's Chart, lab work & pertinent test results  History of Anesthesia Complications Negative for: history of anesthetic complications  Airway Mallampati: III  TM Distance: >3 FB Neck ROM: limited    Dental  (+) Poor Dentition, Missing, Edentulous Upper, Edentulous Lower   Pulmonary neg shortness of breath, asthma , sleep apnea ,    Pulmonary exam normal breath sounds clear to auscultation       Cardiovascular Exercise Tolerance: Poor hypertension, (-) Past MI Normal cardiovascular exam Rhythm:regular Rate:Normal     Neuro/Psych Seizures -,  PSYCHIATRIC DISORDERS Anxiety Depression CVA, Residual Symptoms    GI/Hepatic Neg liver ROS, GERD  Controlled,  Endo/Other  diabetes, Type 2, Oral Hypoglycemic Agents  Renal/GU negative Renal ROS  negative genitourinary   Musculoskeletal  (+) Arthritis ,   Abdominal   Peds  Hematology negative hematology ROS (+) anemia ,   Anesthesia Other Findings Past Medical History:   Seizures                                                     Stroke                                                       Brain tumor                                                  Diabetes mellitus without complication                       Arthritis                                                    Sleep apnea                                                  GERD (gastroesophageal reflux disease)                       Asthma                                                       Long QT interval  Hypertension                                                 Panic attacks                                                Anxiety                                                      Depression                                                    Syncope                                              Super Morbid Obesity BMI 62 Active GI bleed.  No vomiting or nausea           Reproductive/Obstetrics negative OB ROS                             Anesthesia Physical Anesthesia Plan  ASA: IV  Anesthesia Plan: General   Post-op Pain Management:    Induction:   Airway Management Planned:   Additional Equipment:   Intra-op Plan:   Post-operative Plan:   Informed Consent: I have reviewed the patients History and Physical, chart, labs and discussed the procedure including the risks, benefits and alternatives for the proposed anesthesia with the patient or authorized representative who has indicated his/her understanding and acceptance.   Dental Advisory Given  Plan Discussed with: Anesthesiologist, CRNA and Surgeon  Anesthesia Plan Comments:         Anesthesia Quick Evaluation

## 2015-04-10 NOTE — Care Management (Signed)
Spoke with patient who is from home with family, Adult children living in the home.  Has 24 hour assistance in the home. Patient son who is the POA is present in the room. Patient has 2 walkers and a scooter. Ramps and handicapped accessible house. No O2 denies issues with medications. PCP is Dr Gilford Rile at [place]. No issus with transportation, stated that her son is her "designated Regulatory affairs officer.  Son confirms resources. No NCM needs identified at this time.

## 2015-04-11 LAB — GLUCOSE, CAPILLARY
GLUCOSE-CAPILLARY: 121 mg/dL — AB (ref 65–99)
GLUCOSE-CAPILLARY: 117 mg/dL — AB (ref 65–99)
GLUCOSE-CAPILLARY: 114 mg/dL — AB (ref 65–99)
Glucose-Capillary: 180 mg/dL — ABNORMAL HIGH (ref 65–99)

## 2015-04-11 LAB — HEMOGLOBIN: Hemoglobin: 7.4 g/dL — ABNORMAL LOW (ref 12.0–16.0)

## 2015-04-11 MED ORDER — ACETAMINOPHEN 325 MG PO TABS
650.0000 mg | ORAL_TABLET | Freq: Four times a day (QID) | ORAL | Status: DC | PRN
Start: 2015-04-11 — End: 2015-04-13

## 2015-04-11 MED ORDER — ZOLPIDEM TARTRATE 5 MG PO TABS: 5.0000 mg | ORAL_TABLET | Freq: Every evening | ORAL | Status: DC | PRN

## 2015-04-11 NOTE — Progress Notes (Signed)
[place] at [place] NAME: Sharon Kirby    MR#:  [id number]  DATE OF BIRTH:  [birth year]  SUBJECTIVE:  CHIEF COMPLAINT:   Chief Complaint  Patient presents with  . Anemia   Admitted for BRBPR. EGD/Colonoscopy done. 2 units PRBC transfused. No further bleeding. Some nausea. No abd pain. headache  REVIEW OF SYSTEMS:    Review of Systems  Constitutional: Positive for malaise/fatigue. Negative for fever and chills.  HENT: Negative for sore throat.   Eyes: Negative for blurred vision, double vision and pain.  Respiratory: Negative for cough, hemoptysis, shortness of breath and wheezing.   Cardiovascular: Negative for chest pain, palpitations, orthopnea and leg swelling.  Gastrointestinal: Positive for nausea. Negative for heartburn, vomiting, abdominal pain, diarrhea and constipation.  Genitourinary: Negative for dysuria and hematuria.  Musculoskeletal: Negative for back pain and joint pain.  Skin: Negative for rash.  Neurological: Positive for headaches. Negative for sensory change, speech change and focal weakness.  Endo/Heme/Allergies: Does not bruise/bleed easily.  Psychiatric/Behavioral: Negative for depression. The patient is not nervous/anxious.     DRUG ALLERGIES:   Allergies  Allergen Reactions  . Sulfa Antibiotics Anaphylaxis  . Penicillins Rash    VITALS:  Blood pressure 132/62, pulse 99, temperature 99.3 F (37.4 C), temperature source Oral, resp. rate 17, height 5' (1.524 m), weight 144.697 kg (319 lb), SpO2 95 %.  PHYSICAL EXAMINATION:  GENERAL:  64 y.o.-year-old obase patient lying in the bed with no acute distress.Morbidly obese. EYES: Pupils equal, round, reactive to light . No scleral icterus.  HEENT: Head atraumatic, normocephalic. Oropharynx and nasopharynx clear.  NECK:  Supple, no jugular venous distention. No thyroid enlargement, no tenderness.  LUNGS: Normal breath sounds bilaterally, no  wheezing, rales,rhonchi or crepitation. No use of accessory muscles of respiration.  CARDIOVASCULAR: S1, S2 normal. No murmurs.  ABDOMEN: Soft, nontender, nondistended. Bowel sounds present. No organomegaly or mass.  EXTREMITIES:positive for chronic pedal edema, no cyanosis, or clubbing.  NEUROLOGIC: Cranial nerves II through XII are intact. Muscle strength 4/5 in all extremities. Sensation intact. Gait not checked.  PSYCHIATRIC: The patient is drowsy, and have very short arousal spells on stimuli.  SKIN: No obvious rash, lesion, or ulcer.   Physical Exam LABORATORY PANEL:   CBC  Recent Labs Lab 04/10/15 0156 04/11/15 0850  WBC 5.3  --   HGB 6.4* 7.4*  HCT 21.4*  --   PLT 159  --    ------------------------------------------------------------------------------------------------------------------  Chemistries   Recent Labs Lab 04/09/15 1321  NA 137  K 4.0  CL 104  CO2 25  GLUCOSE 94  BUN 8  CREATININE 0.52  CALCIUM 8.9  AST 49*  ALT 22  ALKPHOS 134*  BILITOT 1.0   ------------------------------------------------------------------------------------------------------------------  Cardiac Enzymes No results for input(s): TROPONINI in the last 168 hours. ------------------------------------------------------------------------------------------------------------------  RADIOLOGY:  No results found.  ASSESSMENT AND PLAN:   64 year old Sharon Kirby with past medical history of obstructive sleep apnea, anxiety depression panic attacks, GERD, osteoarthritis with chronic left shoulder pain comes to the emergency room with  1. GI bleed - Acute blood loss anemia due to GI blood loss chronic NSAIDs use.  EGD normal except hiatal hernia. Colon likely source with vascular ectasias. Monitor Hb. D/c home if Hb stable without bleeding in AM.   2. Obstructive sleep apnea noncompliant to CPAP.  3. Type 2 diabetes we'll hold off on by mouth meds. Sliding scale insulin.  4.  Hypertension continue lisinopril  5. Anxiety,  depression, panic attacks Continue clonazepam, Paxil  6. Hyperlipidemia on Pravachol  7. DVT prophylaxis SCD and teds no lovenox/heparin given GI bleed   All the records are reviewed and case discussed with Care Management/Social Workerr. Management plans discussed with the patient, family and they are in agreement.  CODE STATUS: Full TOTAL TIME TAKING CARE OF THIS PATIENT: 35 minutes.    POSSIBLE D/C IN 1-2 DAYS, DEPENDING ON CLINICAL CONDITION.   Hillary Bow R M.D on 04/11/2015   Between 7am to 6pm - Pager - [phone]  After 6pm go to [email or web address] - password [email or web address] [facility]  Office  [phone]  CC: Primary care physician; Madelyn Brunner, MD  Note: This dictation was prepared with Dragon dictation along with smaller phrase technology. Any transcriptional errors that result from this process are unintentional.

## 2015-04-11 NOTE — Progress Notes (Signed)
Physical Therapy Evaluation Patient Details Name: Sharon Kirby MRN: [id number] DOB: [birth year] Today's Date: 04/11/2015   History of Present Illness  Patient is a 64 y.o. female who was admitted on 22 September after PCP referred her to ER for low hemoglobin levels. PMH of obesity, DM, HTN, seizures, CVA, and brain tumor.  Clinical Impression  Upon arrival, PT found patient on [facility] with nurse assistant after patient had urinated on floor. Patient was resistant to PT stating that all she wanted to do was sleep because she had a HA. Patient demonstrated impulsive behavior on evaluation and refused to demonstrate ability to safely ambulate short distances. Patient's home seems to be handicap accessible; however, her caregivers are her family members, one being her son with MS who ambulates using a SPC. Patient has hx of falls and demonstrates decreased knowledge of safety awareness. Because of this, it is believed patient would benefit from 24 hour assistance at home until she can safely transfer/ambulate short distances as her PLOF requires.    Follow Up Recommendations Supervision/Assistance - 24 hour;Home health PT    Equipment Recommendations       Recommendations for Other Services       Precautions / Restrictions Precautions Precautions: Fall Restrictions Weight Bearing Restrictions: No      Mobility  Bed Mobility Overal bed mobility: Modified Independent (With hospital bed)             General bed mobility comments: Patient utilizes handrails to pull; able to bridge through LEs  Transfers Overall transfer level: Needs assistance Equipment used: Rolling walker (2 wheeled) Transfers: Sit to/from Stand Sit to Stand: Min assist         General transfer comment: Patient demonstrates impulsive behavior when moving from sit to stand, grabbing and pulling on RW, ignoring PT commands. When instructed how to properly sit, she sat forcefully with lack of  control.  Ambulation/Gait             General Gait Details: Patient refused to demonstrate gait.  Stairs            Wheelchair Mobility    Modified Rankin (Stroke Patients Only)       Balance Overall balance assessment: Needs assistance Sitting-balance support: Bilateral upper extremity supported Sitting balance-Leahy Scale: Fair Sitting balance - Comments: Patient leaning posteriorly to side Postural control: Posterior lean;Left lateral lean;Right lateral lean Standing balance support: Bilateral upper extremity supported Standing balance-Leahy Scale: Fair Standing balance comment: Patient unable to demonstrate weightshifting with standing marching at EOB with RW                             Pertinent Vitals/Pain Pain Assessment: No/denies pain    Home Living Family/patient expects to be discharged to:: Private residence Living Arrangements: Children;Other (Comment) (Patient's son with MS who uses SPC and daughter/son in law) Available Help at Discharge: Family;Available 24 hours/day;Other (Comment) (Patient's family assists her with ADLs) Type of Home: House Home Access: Level entry     Home Layout: One level Home Equipment: Walker - 2 wheels;Walker - 4 wheels;Cane - single point;Electric scooter;Hospital bed;Shower seat - built in      Prior Function Level of Independence: Needs assistance   Gait / Transfers Assistance Needed: Patient was ambulating short distances (bed to bathroom) with assistance and rollator  ADL's / Homemaking Assistance Needed: Patient was requiring assistance with preparing meals/bathing, etc.        Hand Dominance  Extremity/Trunk Assessment   Upper Extremity Assessment: Generalized weakness;LUE deficits/detail       LUE Deficits / Details: Pain in L shoulder, can reach to 90 deg. shoulder flexion, would not allow PROM   Lower Extremity Assessment: Generalized weakness (Patient would not allow formal  MMT)         Communication   Communication: No difficulties  Cognition Arousal/Alertness: Awake/alert Behavior During Therapy: Impulsive Overall Cognitive Status: Within Functional Limits for tasks assessed                      General Comments      Exercises        Assessment/Plan    PT Assessment Patient needs continued PT services  PT Diagnosis Difficulty walking;Generalized weakness   PT Problem List Decreased strength;Decreased activity tolerance;Decreased range of motion;Decreased balance;Decreased mobility;Decreased knowledge of use of DME;Decreased safety awareness;Obesity  PT Treatment Interventions DME instruction;Gait training;Functional mobility training;Therapeutic activities;Therapeutic exercise;Balance training;Patient/family education   PT Goals (Current goals can be found in the Care Plan section) Acute Rehab PT Goals Patient Stated Goal: "To get out of here." PT Goal Formulation: With patient Time For Goal Achievement: 04/25/15 Potential to Achieve Goals: Fair    Frequency Min 2X/week   Barriers to discharge Decreased caregiver support Patient has hx of falls and has family with medical conditions as support system    Co-evaluation               End of Session Equipment Utilized During Treatment: Gait belt Activity Tolerance: Treatment limited secondary to agitation Patient left: in bed;with call bell/phone within reach;with bed alarm set;with family/visitor present Nurse Communication: Mobility status         Time: 5320-2334 PT Time Calculation (min) (ACUTE ONLY): 21 min   Charges:   PT Evaluation $Initial PT Evaluation Tier I: 1 Procedure     PT G Codes:        Dorice Lamas, PT, DPT 04/11/2015, 1:00 PM

## 2015-04-11 NOTE — Consult Note (Signed)
Subjective: Patient seen for Dr. Allen Norris over the weekend. Patient seen for anemia and GI bleeding. 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 The stomach was normal. He has been hemodynamically stable. She is tolerating current diet.  Objective: Vital signs in last 24 hours: Temp:  [97.5 F (36.4 C)-99.3 F (37.4 C)] 99 F (37.2 C) (09/24 1335) Pulse Rate:  [55-103] 103 (09/24 1335) Resp:  [16-17] 17 (09/24 1335) BP: (124-147)/(47-70) 144/47 mmHg (09/24 1335) SpO2:  [95 %-100 %] 99 % (09/24 1335) Blood pressure 144/47, pulse 103, temperature 99 F (37.2 C), temperature source Oral, resp. rate 17, height 5' (1.524 m), weight 144.697 kg (319 lb), SpO2 99 %.   Intake/Output from previous day: 09/23 0701 - 09/24 0700 In: 588 [P.O.:240; I.V.:82; Blood:266] Out: 1200 [Urine:1200]  Intake/Output this shift: Total I/O In: 605.3 [P.O.:360; I.V.:245.3] Out: 975 [Urine:975]   General appearance:  64 year old female, morbidly obese, no acute distress Resp:  Clear to auscultation Cardio:  Regular rate and rhythm GI:  Soft nontender unable to palpate internal organs, bowel sounds are positive, Extremities:     Lab Results: Results for orders placed or performed during the hospital encounter of 04/09/15 (from the past 24 hour(s))  Prepare RBC     Status: None   Collection Time: 04/10/15  6:00 PM  Result Value Ref Range   Order Confirmation ORDER PROCESSED BY BLOOD BANK   Glucose, capillary     Status: Abnormal   Collection Time: 04/10/15  8:57 PM  Result Value Ref Range   Glucose-Capillary 144 (H) 65 - 99 mg/dL  Glucose, capillary     Status: Abnormal   Collection Time: 04/11/15  7:21 AM  Result Value Ref Range   Glucose-Capillary 121 (H) 65 - 99 mg/dL   Comment 1 Notify RN   Hemoglobin     Status: Abnormal   Collection Time: 04/11/15  8:50 AM  Result Value Ref Range   Hemoglobin 7.4 (L) 12.0 - 16.0 g/dL  Glucose,  capillary     Status: Abnormal   Collection Time: 04/11/15 11:06 AM  Result Value Ref Range   Glucose-Capillary 180 (H) 65 - 99 mg/dL   Comment 1 Notify RN   Glucose, capillary     Status: Abnormal   Collection Time: 04/11/15  4:28 PM  Result Value Ref Range   Glucose-Capillary 117 (H) 65 - 99 mg/dL   Comment 1 Document in Chart       Recent Labs  04/09/15 1321 04/10/15 0156 04/11/15 0850  WBC 4.5 5.3  --   HGB 5.3* 6.4* 7.4*  HCT 18.3* 21.4*  --   PLT 167 159  --    BMET  Recent Labs  04/09/15 1321  NA 137  K 4.0  CL 104  CO2 25  GLUCOSE 94  BUN 8  CREATININE 0.52  CALCIUM 8.9   LFT  Recent Labs  04/09/15 1321  PROT 7.2  ALBUMIN 3.7  AST 49*  ALT 22  ALKPHOS 134*  BILITOT 1.0  BILIDIR 0.2  IBILI 0.8   PT/INR No results for input(s): LABPROT, INR in the last 72 hours. Hepatitis Panel No results for input(s): HEPBSAG, HCVAB, HEPAIGM, HEPBIGM in the last 72 hours. C-Diff No results for input(s): CDIFFTOX in the last 72 hours. No results for input(s): CDIFFPCR in the last 72 hours.   Studies/Results: No results found.  Scheduled Inpatient Medications:   . insulin aspart  0-9  Units Subcutaneous TID WC  . pantoprazole (PROTONIX) IV  40 mg Intravenous Q12H    Continuous Inpatient Infusions:     PRN Inpatient Medications:  [DISCONTINUED] acetaminophen **OR** acetaminophen, acetaminophen, HYDROcodone-acetaminophen, ondansetron **OR** ondansetron (ZOFRAN) IV, senna-docusate, zolpidem  Miscellaneous:   Assessment:  1. Anemia, Hemoccult-positive stool. Status post treatment of colonic AVMs  Plan:  1. Would advance diet to full liquids tomorrow. Plans noted for continued observation through tomorrow. If she has recurrent issues on recheck then would recommend further outpatient evaluation small bowel series and possible video capsule endoscopy as clinically feasible. 2. Following  Lollie Sails MD 04/11/2015, 4:35 PM

## 2015-04-12 ENCOUNTER — Encounter: Payer: Self-pay | Admitting: Gastroenterology

## 2015-04-12 LAB — PREPARE RBC (CROSSMATCH)

## 2015-04-12 LAB — GLUCOSE, CAPILLARY
GLUCOSE-CAPILLARY: 125 mg/dL — AB (ref 65–99)
GLUCOSE-CAPILLARY: 97 mg/dL (ref 65–99)
Glucose-Capillary: 99 mg/dL (ref 65–99)
Glucose-Capillary: 126 mg/dL — ABNORMAL HIGH (ref 65–99)

## 2015-04-12 LAB — HEMOGLOBIN: Hemoglobin: 7 g/dL — ABNORMAL LOW (ref 12.0–16.0)

## 2015-04-12 MED ORDER — SODIUM CHLORIDE 0.9 % IV SOLN
Freq: Once | INTRAVENOUS | Status: AC
  Administered 2015-04-12: 12:00:00 via INTRAVENOUS

## 2015-04-12 MED ORDER — DIPHENHYDRAMINE HCL 25 MG PO CAPS
50.0000 mg | ORAL_CAPSULE | ORAL | Status: DC | PRN
  Administered 2015-04-12 – 2015-04-13 (×2): 50 mg via ORAL
  Filled 2015-04-12 (×2): qty 2

## 2015-04-12 MED ORDER — FUROSEMIDE 10 MG/ML IJ SOLN
20.0000 mg | Freq: Once | INTRAMUSCULAR | Status: AC
  Administered 2015-04-12: 20 mg via INTRAVENOUS
  Filled 2015-04-12: qty 2

## 2015-04-12 NOTE — Progress Notes (Signed)
[place] [facility] [place] NAME: Sharon Kirby    MR#:  [id number]  DATE OF BIRTH:  1951-05-10  SUBJECTIVE:  CHIEF COMPLAINT:   Chief Complaint  Patient presents with  . Anemia   Admitted for BRBPR. EGD/Colonoscopy done. 2 units PRBC transfused. No further bleeding. Some nausea. No abd pain. Headache.  REVIEW OF SYSTEMS:    Review of Systems  Constitutional: Positive for malaise/fatigue. Negative for fever and chills.  HENT: Negative for sore throat.   Eyes: Negative for blurred vision, double vision and pain.  Respiratory: Negative for cough, hemoptysis, shortness of breath and wheezing.   Cardiovascular: Negative for chest pain, palpitations, orthopnea and leg swelling.  Gastrointestinal: Positive for nausea. Negative for heartburn, vomiting, abdominal pain, diarrhea and constipation.  Genitourinary: Negative for dysuria and hematuria.  Musculoskeletal: Negative for back pain and joint pain.  Skin: Negative for rash.  Neurological: Positive for headaches. Negative for sensory change, speech change and focal weakness.  Endo/Heme/Allergies: Does not bruise/bleed easily.  Psychiatric/Behavioral: Negative for depression. The patient is not nervous/anxious.     DRUG ALLERGIES:   Allergies  Allergen Reactions  . Sulfa Antibiotics Anaphylaxis  . Penicillins Rash    VITALS:  Blood pressure 158/66, pulse 97, temperature 98.7 F (37.1 C), temperature source Oral, resp. rate 16, height 5' (1.524 m), weight 144.697 kg (319 lb), SpO2 95 %.  PHYSICAL EXAMINATION:  GENERAL:  64 y.o.-year-old obase patient lying in the bed with no acute distress.Morbidly obese. EYES: Pupils equal, round, reactive to light . No scleral icterus.  HEENT: Head atraumatic, normocephalic. Oropharynx and nasopharynx clear.  NECK:  Supple, no jugular venous distention. No thyroid enlargement, no tenderness.  LUNGS: Normal breath sounds bilaterally, no  wheezing, rales,rhonchi or crepitation. No use of accessory muscles of respiration.  CARDIOVASCULAR: S1, S2 normal. No murmurs.  ABDOMEN: Soft, nontender, nondistended. Bowel sounds present. No organomegaly or mass.  EXTREMITIES:positive for chronic pedal edema, no cyanosis, or clubbing.  NEUROLOGIC: Cranial nerves II through XII are intact. Muscle strength 4/5 in all extremities. Sensation intact. Gait not checked.  PSYCHIATRIC: The patient is drowsy, and have very short arousal spells on stimuli.  SKIN: No obvious rash, lesion, or ulcer.   Physical Exam LABORATORY PANEL:   CBC  Recent Labs Lab 04/10/15 0156  04/12/15 0627  WBC 5.3  --   --   HGB 6.4*  < > 7.0*  HCT 21.4*  --   --   PLT 159  --   --   < > = values in this interval not displayed. ------------------------------------------------------------------------------------------------------------------  Chemistries   Recent Labs Lab 04/09/15 1321  NA 137  K 4.0  CL 104  CO2 25  GLUCOSE 94  BUN 8  CREATININE 0.52  CALCIUM 8.9  AST 49*  ALT 22  ALKPHOS 134*  BILITOT 1.0   ------------------------------------------------------------------------------------------------------------------  Cardiac Enzymes No results for input(s): TROPONINI in the last 168 hours. ------------------------------------------------------------------------------------------------------------------  RADIOLOGY:  No results found.  ASSESSMENT AND PLAN:   64 year old Sharon Kirby with past medical history of obstructive sleep apnea, anxiety depression panic attacks, GERD, osteoarthritis with chronic left shoulder pain comes to the emergency room with  1. GI bleed - Acute blood loss anemia due to GI blood loss chronic NSAIDs use.  EGD normal except hiatal hernia. Colon likely source with vascular ectasias. Monitor Hb. Will transfuse 1 unit PRBC today as she has symptomatic anemia at 7. Repeat hb in [place]   2. Obstructive  sleep apnea  noncompliant to CPAP.  3. Type 2 diabetes we'll hold off on by mouth meds. Sliding scale insulin.  4. Hypertension continue lisinopril  5. Anxiety, depression, panic attacks Continue clonazepam, Paxil  6. Hyperlipidemia on Pravachol  7. DVT prophylaxis SCD and teds no lovenox/heparin given GI bleed   All the records are reviewed and case discussed with Care Management/Social Workerr. Management plans discussed with the patient, family and they are in agreement.  CODE STATUS: Full TOTAL TIME TAKING CARE OF THIS PATIENT: 35 minutes.    POSSIBLE D/C TOMORROW DEPENDING ON CLINICAL CONDITION.   Sharon Kirby R M.D on 04/12/2015   Between 7am to 6pm - Pager - [phone]  After 6pm go to [email or web address] - password [email or web address] [facility]  Office  [phone]  CC: Primary care physician; Madelyn Brunner, MD  Note: This dictation was prepared with Dragon dictation along with smaller phrase technology. Any transcriptional errors that result from this process are unintentional.

## 2015-04-12 NOTE — Progress Notes (Signed)
Called Dr. Lavetta Nielsen regarding patient's complaints of itching.  Doctor ordered benedryl po 50 mg q4 prn.   Christene Slates  04/12/2015  11:03 PM

## 2015-04-13 ENCOUNTER — Encounter: Payer: Self-pay | Admitting: Internal Medicine

## 2015-04-13 DIAGNOSIS — D62 Acute posthemorrhagic anemia: Secondary | ICD-10-CM | POA: Diagnosis present

## 2015-04-13 LAB — TYPE AND SCREEN
ABO/RH(D): O POS
ANTIBODY SCREEN: NEGATIVE
UNIT DIVISION: 0
UNIT DIVISION: 0
UNIT DIVISION: 0
Unit division: 0

## 2015-04-13 LAB — HEMOGLOBIN: Hemoglobin: 8.5 g/dL — ABNORMAL LOW (ref 12.0–16.0)

## 2015-04-13 LAB — GLUCOSE, CAPILLARY: Glucose-Capillary: 128 mg/dL — ABNORMAL HIGH (ref 65–99)

## 2015-04-13 MED ORDER — FERROUS SULFATE 325 (65 FE) MG PO TABS: 325.0000 mg | ORAL_TABLET | Freq: Two times a day (BID) | ORAL | Status: AC

## 2015-04-13 MED ORDER — METFORMIN HCL 1000 MG PO TABS: 1000.0000 mg | ORAL_TABLET | Freq: Two times a day (BID) | ORAL | Status: DC

## 2015-04-13 MED ORDER — TRAMADOL HCL 50 MG PO TABS: 50.0000 mg | ORAL_TABLET | Freq: Three times a day (TID) | ORAL | Status: DC | PRN

## 2015-04-13 NOTE — Discharge Instructions (Addendum)
°  DIET:  Diabetic diet  DISCHARGE CONDITION:  Stable  ACTIVITY:  Activity as tolerated  OXYGEN:  Home Oxygen: No.   Oxygen Delivery: room air  DISCHARGE LOCATION:  home   If you experience worsening of your admission symptoms, develop shortness of breath, life threatening emergency, suicidal or homicidal thoughts you must seek medical attention immediately by calling 911 or calling your MD immediately  if symptoms less severe.  You Must read complete instructions/literature along with all the possible adverse reactions/side effects for all the Medicines you take and that have been prescribed to you. Take any new Medicines after you have completely understood and accpet all the possible adverse reactions/side effects.   Please note  You were cared for by a hospitalist during your hospital stay. If you have any questions about your discharge medications or the care you received while you were in the hospital after you are discharged, you can call the unit and asked to speak with the hospitalist on call if the hospitalist that took care of you is not available. Once you are discharged, your primary care physician will handle any further medical issues. Please note that NO REFILLS for any discharge medications will be authorized once you are discharged, as it is imperative that you return to your primary care physician (or establish a relationship with a primary care physician if you do not have one) for your aftercare needs so that they can reassess your need for medications and monitor your lab values.   No NSAIDs  Resume all your home medications except Ibuprofen. You can restart Aspirin 81mg   from 04/16/2015.

## 2015-04-13 NOTE — Consult Note (Signed)
Subjective: Patient seen for anemia and gi bleeding.  Patient states the abdominal discomfort yesterday is better today.  No nausea.  C/o loose stools she feel is due to diet.   Objective: Vital signs in last 24 hours: Temp:  [97.9 F (36.6 C)-98.7 F (37.1 C)] 98.7 F (37.1 C) (09/25 2031) Pulse Rate:  [87-97] 87 (09/25 2031) Resp:  [16-20] 20 (09/25 2031) BP: (127-158)/(61-82) 127/65 mmHg (09/25 2031) SpO2:  [94 %-97 %] 96 % (09/25 2031) Blood pressure 127/65, pulse 87, temperature 98.7 F (37.1 C), temperature source Oral, resp. rate 20, height 5' (1.524 m), weight 144.697 kg (319 lb), SpO2 96 %.   Intake/Output from previous day: 09/25 0701 - 09/26 0700 In: 534 [P.O.:240; Blood:294] Out: 2050 [Urine:2050]  Intake/Output this shift: Total I/O In: 0  Out: 800 [Urine:800]   General appearance:  No acute distress Resp:  cta Cardio:  rrr GI:  Obese, mild discomfort rl Q, unable to palpate internal organs.  Bowel sounds normal.  Extremities:     Lab Results: Results for orders placed or performed during the hospital encounter of 04/09/15 (from the past 24 hour(s))  Hemoglobin     Status: Abnormal   Collection Time: 04/12/15  6:27 AM  Result Value Ref Range   Hemoglobin 7.0 (L) 12.0 - 16.0 g/dL  Glucose, capillary     Status: None   Collection Time: 04/12/15  7:30 AM  Result Value Ref Range   Glucose-Capillary 99 65 - 99 mg/dL   Comment 1 Notify RN   Glucose, capillary     Status: Abnormal   Collection Time: 04/12/15 11:23 AM  Result Value Ref Range   Glucose-Capillary 126 (H) 65 - 99 mg/dL   Comment 1 Notify RN   Prepare RBC     Status: None   Collection Time: 04/12/15 11:58 AM  Result Value Ref Range   Order Confirmation ORDER PROCESSED BY BLOOD BANK   Glucose, capillary     Status: Abnormal   Collection Time: 04/12/15  4:42 PM  Result Value Ref Range   Glucose-Capillary 125 (H) 65 - 99 mg/dL   Comment 1 Notify RN   Glucose, capillary     Status: None   Collection Time: 04/12/15 10:05 PM  Result Value Ref Range   Glucose-Capillary 97 65 - 99 mg/dL      Recent Labs  04/10/15 0156 04/11/15 0850 04/12/15 0627  WBC 5.3  --   --   HGB 6.4* 7.4* 7.0*  HCT 21.4*  --   --   PLT 159  --   --    BMET No results for input(s): NA, K, CL, CO2, GLUCOSE, BUN, CREATININE, CALCIUM in the last 72 hours. LFT No results for input(s): PROT, ALBUMIN, AST, ALT, ALKPHOS, BILITOT, BILIDIR, IBILI in the last 72 hours. PT/INR No results for input(s): LABPROT, INR in the last 72 hours. Hepatitis Panel No results for input(s): HEPBSAG, HCVAB, HEPAIGM, HEPBIGM in the last 72 hours. C-Diff No results for input(s): CDIFFTOX in the last 72 hours. No results for input(s): CDIFFPCR in the last 72 hours.   Studies/Results: No results found.  Scheduled Inpatient Medications:   . insulin aspart  0-9 Units Subcutaneous TID WC  . pantoprazole (PROTONIX) IV  40 mg Intravenous Q12H    Continuous Inpatient Infusions:     PRN Inpatient Medications:  [DISCONTINUED] acetaminophen **OR** acetaminophen, acetaminophen, diphenhydrAMINE, HYDROcodone-acetaminophen, ondansetron **OR** ondansetron (ZOFRAN) IV, senna-docusate, zolpidem  Miscellaneous:   Assessment:  1) anemia, heme positive stool.  Some drop of hgb, plans for tfx noted.  AVMs treated in the colon on procedure last Friday.  2) loose stool- patient states this is new since admission.;   Plan:  1) continue current.  Will check for c diff. And c+s. Dr Allen Norris to be back tomorrow.   Lollie Sails MD 04/13/2015, 12:15 AM

## 2015-04-13 NOTE — Care Management Important Message (Signed)
Important Message  Patient Details  Name: Sharon Kirby MRN: [id number] Date of Birth: [birth year]   Medicare Important Message Given:  Yes-second notification given    Alvie Heidelberg, RN 04/13/2015, 8:21 AM

## 2015-04-17 NOTE — Discharge Summary (Signed)
[facility] [place] NAME: Sharon Kirby    MR#:  [id number]  DATE OF BIRTH:  04-Nov-1950  DATE OF ADMISSION:  04/09/2015 ADMITTING PHYSICIAN: Fritzi Mandes, MD  DATE OF DISCHARGE: 04/13/2015  1:30 PM  PRIMARY CARE PHYSICIAN: Madelyn Brunner, MD    ADMISSION DIAGNOSIS:  Anemia, unspecified anemia type [D64.9] Gastrointestinal hemorrhage, unspecified gastritis, unspecified gastrointestinal hemorrhage type [K92.2]  DISCHARGE DIAGNOSIS:  Active Problems:   GI bleed   Iron deficiency anemia   Angiodysplasia of intestinal tract   Acute blood loss anemia   SECONDARY DIAGNOSIS:   Past Medical History  Diagnosis Date  . Seizures   . Stroke   . Brain tumor   . Diabetes mellitus without complication   . Arthritis   . Sleep apnea   . GERD (gastroesophageal reflux disease)   . Asthma   . Long QT interval   . Hypertension   . Panic attacks   . Anxiety   . Depression   . Syncope      ADMITTING HISTORY  Sharon Kirby is a 64 y.o. female with a known history of obstructive sleep apnea noncompliant to CPAP, chronic left shoulder pain, GERD, morbid of S3, history of seizures history of brain tumor, anxiety depression panic attacks Kirby to the emergency room after she was asked by her primary care physician to go to the emergency room due to her low hemoglobin. Patient had seen Dr. Gilford Rile for some issues with itching on her skin. She was found to have routine blood work and was noted to have a bilirubin of 5.1. Repeat blood work in the emergency room showed a hemoglobin of 5.3. Patient reports taking ibuprofen 800 mg 2 times to 3 times a day for her chronic left shoulder pain. She sees orthopedic for local injections on on and off for last several months. Denies any melanotic stool. She does have symptoms of epigastric pain and some nausea. Patient does not remember getting EGD in the past.   HOSPITAL COURSE:   64 year old Sharon Kirby  with past medical history of obstructive sleep apnea, anxiety depression panic attacks, GERD, osteoarthritis with chronic left shoulder pain Kirby to the emergency room with  1. GI bleed - Acute blood loss anemia due to GI blood loss chronic NSAIDs use.  EGD normal except hiatal hernia. Colon likely source with vascular ectasias. Monitored Hb and stable. Aspirin held for 1 week and then restart. Iron supplements GI f/u in 1-2 weeks  2. Obstructive sleep apnea noncompliant to CPAP.  3. Type 2 diabetes we'll hold off on by mouth meds. Sliding scale insulin.  4. Hypertension continue lisinopril  5. Anxiety, depression, panic attacks Continue clonazepam, Paxil  6. Hyperlipidemia on Pravachol  Stable for discharge home   CONSULTS OBTAINED:  Treatment Team:  Lucilla Lame, MD  DRUG ALLERGIES:   Allergies  Allergen Reactions  . Sulfa Antibiotics Anaphylaxis  . Penicillins Rash    DISCHARGE MEDICATIONS:   Discharge Medication List as of 04/13/2015 11:32 AM    START taking these medications   Details  ferrous sulfate 325 (65 FE) MG tablet Take 1 tablet (325 mg total) by mouth 2 (two) times daily with a meal., Starting 04/13/2015, Until Discontinued, Print    traMADol (ULTRAM) 50 MG tablet Take 1 tablet (50 mg total) by mouth every 8 (eight) hours as needed for moderate pain or severe pain., Starting 04/13/2015, Until Discontinued, Print  Today    VITAL SIGNS:  Blood pressure 123/81, pulse 96, temperature 97.6 F (36.4 C), temperature source Oral, resp. rate 20, height 5' (1.524 m), weight 144.697 kg (319 lb), SpO2 96 %.  I/O:  No intake or output data in the 24 hours ending 04/17/15 1354  PHYSICAL EXAMINATION:  Physical Exam  GENERAL:  64 y.o.-year-old patient lying in the bed with no acute distress. Morbidly obese. LUNGS: Normal breath sounds bilaterally, no wheezing, rales,rhonchi or crepitation. No use of accessory muscles of respiration.  CARDIOVASCULAR:  S1, S2 normal. No murmurs, rubs, or gallops.  ABDOMEN: Soft, non-tender, non-distended. Bowel sounds present. No organomegaly or mass.  NEUROLOGIC: Moves all 4 extremities. PSYCHIATRIC: The patient is alert and oriented x 3.  SKIN: No obvious rash, lesion, or ulcer.   DATA REVIEW:   CBC  Recent Labs Lab 04/13/15 0710  HGB 8.5*    Chemistries  No results for input(s): NA, K, CL, CO2, GLUCOSE, BUN, CREATININE, CALCIUM, MG, AST, ALT, ALKPHOS, BILITOT in the last 168 hours.  Invalid input(s): GFRCGP  Cardiac Enzymes No results for input(s): TROPONINI in the last 168 hours.  Microbiology Results  No results found for this or any previous visit.  RADIOLOGY:  No results found.    Follow up with PCP in 1 week.  Management plans discussed with the patient, family and they are in agreement.  CODE STATUS:  Advance Directive Documentation        Most Recent Value   Type of Advance Directive  Living will, Healthcare Power of Attorney   Pre-existing out of facility DNR order (yellow form or pink MOST form)     "MOST" Form in Place?        TOTAL TIME TAKING CARE OF THIS PATIENT ON DAY OF DISCHARGE: more than 30 minutes.    Sharon Kirby R M.D on 04/17/2015 at 1:54 PM  Between 7am to 6pm - Pager - [phone]  After 6pm go to [email or web address] - password [email or web address] [facility]  Office  [phone]  CC: Primary care physician; Madelyn Brunner, MD     Note: This dictation was prepared with Dragon dictation along with smaller phrase technology. Any transcriptional errors that result from this process are unintentional.

## 2015-04-22 ENCOUNTER — Ambulatory Visit: Payer: Commercial Managed Care - HMO | Admitting: Gastroenterology

## 2015-05-07 ENCOUNTER — Other Ambulatory Visit: Payer: Self-pay | Admitting: Neurology

## 2015-05-07 DIAGNOSIS — G4452 New daily persistent headache (NDPH): Secondary | ICD-10-CM

## 2015-05-11 ENCOUNTER — Ambulatory Visit
Admission: RE | Admit: 2015-05-11 | Discharge: 2015-05-11 | Disposition: A | Discharge status: Home / Self Care | Payer: Commercial Managed Care - HMO | Source: Ambulatory Visit | Attending: Neurology | Admitting: Neurology

## 2015-05-11 DIAGNOSIS — G4452 New daily persistent headache (NDPH): Secondary | ICD-10-CM | POA: Diagnosis not present

## 2015-05-31 ENCOUNTER — Emergency Department: Payer: Commercial Managed Care - HMO

## 2015-05-31 ENCOUNTER — Emergency Department
Admission: EM | Admit: 2015-05-31 | Discharge: 2015-05-31 | Disposition: A | Discharge status: Home / Self Care | Payer: Commercial Managed Care - HMO | Source: Home / Self Care | Attending: Emergency Medicine | Admitting: Emergency Medicine

## 2015-05-31 ENCOUNTER — Encounter: Payer: Self-pay | Admitting: *Deleted

## 2015-05-31 DIAGNOSIS — I10 Essential (primary) hypertension: Secondary | ICD-10-CM

## 2015-05-31 DIAGNOSIS — Z880 Allergy status to penicillin: Secondary | ICD-10-CM | POA: Insufficient documentation

## 2015-05-31 DIAGNOSIS — Y998 Other external cause status: Secondary | ICD-10-CM | POA: Insufficient documentation

## 2015-05-31 DIAGNOSIS — X58XXXA Exposure to other specified factors, initial encounter: Secondary | ICD-10-CM | POA: Insufficient documentation

## 2015-05-31 DIAGNOSIS — Y9389 Activity, other specified: Secondary | ICD-10-CM | POA: Insufficient documentation

## 2015-05-31 DIAGNOSIS — S8001XA Contusion of right knee, initial encounter: Secondary | ICD-10-CM

## 2015-05-31 DIAGNOSIS — Z79899 Other long term (current) drug therapy: Secondary | ICD-10-CM | POA: Insufficient documentation

## 2015-05-31 DIAGNOSIS — M545 Low back pain: Secondary | ICD-10-CM | POA: Diagnosis not present

## 2015-05-31 DIAGNOSIS — E119 Type 2 diabetes mellitus without complications: Secondary | ICD-10-CM

## 2015-05-31 DIAGNOSIS — Y9289 Other specified places as the place of occurrence of the external cause: Secondary | ICD-10-CM

## 2015-05-31 MED ORDER — DIAZEPAM 5 MG PO TABS
5.0000 mg | ORAL_TABLET | Freq: Once | ORAL | Status: AC
  Administered 2015-05-31: 5 mg via ORAL
  Filled 2015-05-31: qty 1

## 2015-05-31 MED ORDER — OXYCODONE-ACETAMINOPHEN 5-325 MG PO TABS: 1.0000 | ORAL_TABLET | Freq: Four times a day (QID) | ORAL | Status: DC | PRN

## 2015-05-31 MED ORDER — OXYCODONE-ACETAMINOPHEN 5-325 MG PO TABS
1.0000 | ORAL_TABLET | Freq: Once | ORAL | Status: AC
  Administered 2015-05-31: 1 via ORAL
  Filled 2015-05-31: qty 1

## 2015-05-31 NOTE — Discharge Instructions (Signed)

## 2015-05-31 NOTE — ED Notes (Signed)
Pt fell 2 days ago, EMS called, pt did not come to ED was placed back in bed.  Pt has pain meds not helping tonight pain, for rt knee.

## 2015-05-31 NOTE — ED Provider Notes (Signed)
[facility] Emergency Department Provider Note  ____________________________________________  Time seen: 3:35 AM  I have reviewed the triage vital signs and the nursing notes.   HISTORY  Chief Complaint Knee Pain    HPI Sharon Kirby is a 64 y.o. female reports falling down onto her right knee about 3 days ago. She's had gradual onset but worsening persistent right knee pain worse with weightbearing since then. She is morbidly obese with limited mobility, uses a mobility scooter and a walker at all times. She reports that it is harder to use her walker but she is okay when she uses her mobility scooter. Numbness tingling or weakness or other symptoms. No chest pain or shortness of breath  Pain is in the right knee anteriorly. Worse with movement.   Past Medical History  Diagnosis Date  . Seizures ([place])   . Stroke ([place])   . Brain tumor ([place])   . Diabetes mellitus without complication ([place])   . Arthritis   . Sleep apnea   . GERD (gastroesophageal reflux disease)   . Asthma   . Long QT interval   . Hypertension   . Panic attacks   . Anxiety   . Depression   . Syncope      Patient Active Problem List   Diagnosis Date Noted  . Acute blood loss anemia 04/13/2015  . Iron deficiency anemia   . Angiodysplasia of intestinal tract   . GI bleed 04/09/2015     Past Surgical History  Procedure Laterality Date  . Csf shunt      x2 due to tumor  . Cholecystectomy    . Tonsillectomy    . Carpal tunnel release Left   . Trigger finger release Left   . Knee surgery Left   . Colonoscopy with propofol N/A 04/10/2015    Procedure: COLONOSCOPY WITH PROPOFOL;  Surgeon: Lucilla Lame, MD;  Location: [facility] ENDOSCOPY;  Service: Endoscopy;  Laterality: N/A;  . Esophagogastroduodenoscopy (egd) with propofol N/A 04/10/2015    Procedure: ESOPHAGOGASTRODUODENOSCOPY (EGD) WITH PROPOFOL;  Surgeon: Lucilla Lame, MD;  Location: [facility] ENDOSCOPY;  Service: Endoscopy;   Laterality: N/A;     Current Outpatient Rx  Name  Route  Sig  Dispense  Refill  . ferrous sulfate 325 (65 FE) MG tablet   Oral   Take 1 tablet (325 mg total) by mouth 2 (two) times daily with a meal.   120 tablet   0   . oxyCODONE-acetaminophen (ROXICET) 5-325 MG tablet   Oral   Take 1 tablet by mouth every 6 (six) hours as needed for severe pain.   6 tablet   0   . traMADol (ULTRAM) 50 MG tablet   Oral   Take 1 tablet (50 mg total) by mouth every 8 (eight) hours as needed for moderate pain or severe pain.   30 tablet   0      Allergies Sulfa antibiotics and Penicillins   History reviewed. No pertinent family history.  Social History Social History  Substance Use Topics  . Smoking status: Never Smoker   . Smokeless tobacco: Never Used  . Alcohol Use: No    Review of Systems  Constitutional:   No fever or chills. No weight changes Eyes:   No blurry vision or double vision.  ENT:   No sore throat. Cardiovascular:   No chest pain. Respiratory:   No dyspnea or cough. Gastrointestinal:   Negative for abdominal pain, vomiting and diarrhea.  No BRBPR or melena.  Genitourinary:   Negative for dysuria, urinary retention, bloody urine, or difficulty urinating. Musculoskeletal:   Right knee pain as above Skin:   Negative for rash. Neurological:   Negative for headaches, focal weakness or numbness. Psychiatric:  No anxiety or depression.   Endocrine:  No hot/cold intolerance, changes in energy, or sleep difficulty.  10-point ROS otherwise negative.  ____________________________________________   PHYSICAL EXAM:  VITAL SIGNS: ED Triage Vitals  Enc Vitals Group     BP 05/31/15 0330 159/116 mmHg     Pulse Rate 05/31/15 0330 104     Resp --      Temp --      Temp src --      SpO2 05/31/15 0330 98 %     Weight 05/31/15 0329 303 lb (137.44 kg)     Height 05/31/15 0329 5' (1.524 m)     Head Cir --      Peak Flow --      Pain Score 05/31/15 0329 8     Pain Loc  --      Pain Edu? --      Excl. in [place]? --      Constitutional:   Alert and oriented. Well appearing and in no distress. Eyes:   No scleral icterus. No conjunctival pallor. PERRL. EOMI ENT   Head:   Normocephalic and atraumatic.   Nose:   No congestion/rhinnorhea. No septal hematoma   Mouth/Throat:   MMM, no pharyngeal erythema. No peritonsillar mass. No uvula shift.   Neck:   No stridor. No SubQ emphysema. No meningismus. Hematological/Lymphatic/Immunilogical:   No cervical lymphadenopathy. Cardiovascular:   RRR. Normal and symmetric distal pulses are present in all extremities. No murmurs, rubs, or gallops. Respiratory:   Normal respiratory effort without tachypnea nor retractions. Breath sounds are clear and equal bilaterally. No wheezes/rales/rhonchi. Gastrointestinal:   Soft and nontender. No distention. There is no CVA tenderness.  No rebound, rigidity, or guarding. Genitourinary:   deferred Musculoskeletal:   Right knee tenderness at the joint line over the proximal lateral tibia. No instability, ligaments stable. No joint effusion. Small contusion to the soft tissues anteriorly. Patella nontender. Neurologic:   Normal speech and language.  CN 2-10 normal. Motor grossly intact. No pronator drift.  Normal gait. No gross focal neurologic deficits are appreciated.  Skin:    Skin is warm, dry and intact. No rash noted.  No petechiae, purpura, or bullae. Psychiatric:   Mood and affect are normal. Speech and behavior are normal. Patient exhibits appropriate insight and judgment.  ____________________________________________    LABS (pertinent positives/negatives) (all labs ordered are listed, but only abnormal results are displayed) Labs Reviewed - No data to display ____________________________________________   EKG    ____________________________________________    RADIOLOGY  X-ray right knee  unremarkable  ____________________________________________   PROCEDURES   ____________________________________________   INITIAL IMPRESSION / ASSESSMENT AND PLAN / ED COURSE  Pertinent labs & imaging results that were available during my care of the patient were reviewed by me and considered in my medical decision making (see chart for details).  Patient presents with right knee pain which she is managing well at home with Ace wraps or ice and heating pads and NSAIDs. X-rays negative for acute injury. Likely contusion of the soft tissues. However continue rice and other conservative management and follow-up with primary care.     ____________________________________________   FINAL CLINICAL IMPRESSION(S) / ED DIAGNOSES  Final diagnoses:  Knee contusion, right, initial encounter  Carrie Mew, MD 05/31/15 [phone]

## 2015-06-02 ENCOUNTER — Encounter: Payer: Self-pay | Admitting: Emergency Medicine

## 2015-06-02 ENCOUNTER — Emergency Department: Payer: Commercial Managed Care - HMO

## 2015-06-02 ENCOUNTER — Inpatient Hospital Stay
Admission: EM | Admit: 2015-06-02 | Discharge: 2015-06-05 | DRG: 552 | Disposition: A | Discharge status: Skilled Nursing Facility | Payer: Commercial Managed Care - HMO | Attending: Internal Medicine | Admitting: Internal Medicine

## 2015-06-02 DIAGNOSIS — M25561 Pain in right knee: Secondary | ICD-10-CM | POA: Diagnosis present

## 2015-06-02 DIAGNOSIS — J45909 Unspecified asthma, uncomplicated: Secondary | ICD-10-CM | POA: Diagnosis present

## 2015-06-02 DIAGNOSIS — Z6841 Body Mass Index (BMI) 40.0 and over, adult: Secondary | ICD-10-CM | POA: Diagnosis not present

## 2015-06-02 DIAGNOSIS — F329 Major depressive disorder, single episode, unspecified: Secondary | ICD-10-CM | POA: Diagnosis present

## 2015-06-02 DIAGNOSIS — A4101 Sepsis due to Methicillin susceptible Staphylococcus aureus: Secondary | ICD-10-CM | POA: Diagnosis not present

## 2015-06-02 DIAGNOSIS — I10 Essential (primary) hypertension: Secondary | ICD-10-CM | POA: Diagnosis not present

## 2015-06-02 DIAGNOSIS — Z880 Allergy status to penicillin: Secondary | ICD-10-CM | POA: Diagnosis not present

## 2015-06-02 DIAGNOSIS — G4730 Sleep apnea, unspecified: Secondary | ICD-10-CM | POA: Diagnosis not present

## 2015-06-02 DIAGNOSIS — F419 Anxiety disorder, unspecified: Secondary | ICD-10-CM | POA: Diagnosis present

## 2015-06-02 DIAGNOSIS — L03119 Cellulitis of unspecified part of limb: Secondary | ICD-10-CM | POA: Diagnosis not present

## 2015-06-02 DIAGNOSIS — Z7982 Long term (current) use of aspirin: Secondary | ICD-10-CM | POA: Diagnosis not present

## 2015-06-02 DIAGNOSIS — R569 Unspecified convulsions: Secondary | ICD-10-CM | POA: Diagnosis not present

## 2015-06-02 DIAGNOSIS — R188 Other ascites: Secondary | ICD-10-CM | POA: Diagnosis not present

## 2015-06-02 DIAGNOSIS — E871 Hypo-osmolality and hyponatremia: Secondary | ICD-10-CM | POA: Diagnosis not present

## 2015-06-02 DIAGNOSIS — Z9181 History of falling: Secondary | ICD-10-CM

## 2015-06-02 DIAGNOSIS — K219 Gastro-esophageal reflux disease without esophagitis: Secondary | ICD-10-CM | POA: Diagnosis present

## 2015-06-02 DIAGNOSIS — R410 Disorientation, unspecified: Secondary | ICD-10-CM | POA: Diagnosis not present

## 2015-06-02 DIAGNOSIS — R161 Splenomegaly, not elsewhere classified: Secondary | ICD-10-CM | POA: Diagnosis present

## 2015-06-02 DIAGNOSIS — K760 Fatty (change of) liver, not elsewhere classified: Secondary | ICD-10-CM | POA: Diagnosis not present

## 2015-06-02 DIAGNOSIS — B9561 Methicillin susceptible Staphylococcus aureus infection as the cause of diseases classified elsewhere: Secondary | ICD-10-CM | POA: Diagnosis present

## 2015-06-02 DIAGNOSIS — Z452 Encounter for adjustment and management of vascular access device: Secondary | ICD-10-CM

## 2015-06-02 DIAGNOSIS — Z23 Encounter for immunization: Secondary | ICD-10-CM | POA: Diagnosis not present

## 2015-06-02 DIAGNOSIS — F410 Panic disorder [episodic paroxysmal anxiety] without agoraphobia: Secondary | ICD-10-CM | POA: Diagnosis not present

## 2015-06-02 DIAGNOSIS — G8929 Other chronic pain: Secondary | ICD-10-CM | POA: Diagnosis not present

## 2015-06-02 DIAGNOSIS — E119 Type 2 diabetes mellitus without complications: Secondary | ICD-10-CM | POA: Diagnosis not present

## 2015-06-02 DIAGNOSIS — Z882 Allergy status to sulfonamides status: Secondary | ICD-10-CM | POA: Diagnosis not present

## 2015-06-02 DIAGNOSIS — F418 Other specified anxiety disorders: Secondary | ICD-10-CM | POA: Diagnosis present

## 2015-06-02 DIAGNOSIS — E875 Hyperkalemia: Secondary | ICD-10-CM | POA: Diagnosis not present

## 2015-06-02 DIAGNOSIS — Z7984 Long term (current) use of oral hypoglycemic drugs: Secondary | ICD-10-CM | POA: Diagnosis not present

## 2015-06-02 DIAGNOSIS — A409 Streptococcal sepsis, unspecified: Secondary | ICD-10-CM | POA: Diagnosis not present

## 2015-06-02 DIAGNOSIS — K7460 Unspecified cirrhosis of liver: Secondary | ICD-10-CM | POA: Diagnosis present

## 2015-06-02 DIAGNOSIS — Z79899 Other long term (current) drug therapy: Secondary | ICD-10-CM | POA: Diagnosis not present

## 2015-06-02 DIAGNOSIS — I248 Other forms of acute ischemic heart disease: Secondary | ICD-10-CM | POA: Diagnosis not present

## 2015-06-02 DIAGNOSIS — G4733 Obstructive sleep apnea (adult) (pediatric): Secondary | ICD-10-CM | POA: Diagnosis not present

## 2015-06-02 DIAGNOSIS — M545 Low back pain, unspecified: Secondary | ICD-10-CM

## 2015-06-02 DIAGNOSIS — W19XXXA Unspecified fall, initial encounter: Secondary | ICD-10-CM | POA: Diagnosis present

## 2015-06-02 DIAGNOSIS — M25519 Pain in unspecified shoulder: Secondary | ICD-10-CM | POA: Diagnosis present

## 2015-06-02 DIAGNOSIS — R509 Fever, unspecified: Secondary | ICD-10-CM | POA: Diagnosis not present

## 2015-06-02 DIAGNOSIS — I330 Acute and subacute infective endocarditis: Secondary | ICD-10-CM | POA: Diagnosis not present

## 2015-06-02 DIAGNOSIS — Z8673 Personal history of transient ischemic attack (TIA), and cerebral infarction without residual deficits: Secondary | ICD-10-CM

## 2015-06-02 LAB — BASIC METABOLIC PANEL
ANION GAP: 7 (ref 5–15)
Anion gap: 6 (ref 5–15)
BUN: 24 mg/dL — ABNORMAL HIGH (ref 6–20)
BUN: 24 mg/dL — AB (ref 6–20)
CALCIUM: 8.7 mg/dL — AB (ref 8.9–10.3)
CHLORIDE: 99 mmol/L — AB (ref 101–111)
CO2: 23 mmol/L (ref 22–32)
CO2: 24 mmol/L (ref 22–32)
CREATININE: 0.72 mg/dL (ref 0.44–1.00)
CREATININE: 0.72 mg/dL (ref 0.44–1.00)
Calcium: 8.7 mg/dL — ABNORMAL LOW (ref 8.9–10.3)
Chloride: 100 mmol/L — ABNORMAL LOW (ref 101–111)
GFR calc non Af Amer: >60 mL/min (ref >60)
GFR calc non Af Amer: >60 mL/min (ref >60)
Glucose, Bld: 164 mg/dL — ABNORMAL HIGH (ref 65–99)
Glucose, Bld: 201 mg/dL — ABNORMAL HIGH (ref 65–99)
POTASSIUM: 6.1 mmol/L — AB (ref 3.5–5.1)
Potassium: 4.2 mmol/L (ref 3.5–5.1)
SODIUM: 129 mmol/L — AB (ref 135–145)
SODIUM: 130 mmol/L — AB (ref 135–145)

## 2015-06-02 LAB — TROPONIN I
TROPONIN I: 0.07 ng/mL — AB (ref <0.031)
Troponin I: <0.03 ng/mL (ref <0.031)

## 2015-06-02 LAB — CBC WITH DIFFERENTIAL/PLATELET
BASOS ABS: 0 10*3/uL (ref 0–0.1)
BASOS PCT: 0 %
Band Neutrophils: 4 %
Blasts: 0 %
EOS PCT: 0 %
Eosinophils Absolute: 0 10*3/uL (ref 0–0.7)
HCT: 32.5 % — ABNORMAL LOW (ref 35.0–47.0)
Hemoglobin: 10.7 g/dL — ABNORMAL LOW (ref 12.0–16.0)
LYMPHS ABS: 0.6 10*3/uL — AB (ref 1.0–3.6)
Lymphocytes Relative: 5 %
MCH: 29.3 pg (ref 26.0–34.0)
MCHC: 33.1 g/dL (ref 32.0–36.0)
MCV: 88.5 fL (ref 80.0–100.0)
METAMYELOCYTES PCT: 0 %
MYELOCYTES: 0 %
Monocytes Absolute: 0.8 10*3/uL (ref 0.2–0.9)
Monocytes Relative: 6 %
Neutro Abs: 11.3 10*3/uL — ABNORMAL HIGH (ref 1.4–6.5)
Neutrophils Relative %: 85 %
Other: 0 %
PLATELETS: 98 10*3/uL — AB (ref 150–440)
Promyelocytes Absolute: 0 %
RBC: 3.67 MIL/uL — ABNORMAL LOW (ref 3.80–5.20)
RDW: 27.7 % — ABNORMAL HIGH (ref 11.5–14.5)
WBC: 12.7 10*3/uL — ABNORMAL HIGH (ref 3.6–11.0)
nRBC: 0 /100 WBC

## 2015-06-02 LAB — URINALYSIS COMPLETE WITH MICROSCOPIC (ARMC ONLY)
BACTERIA UA: NONE SEEN
Bilirubin Urine: NEGATIVE
GLUCOSE, UA: NEGATIVE mg/dL
Ketones, ur: NEGATIVE mg/dL
Leukocytes, UA: NEGATIVE
NITRITE: NEGATIVE
PH: 5 (ref 5.0–8.0)
PROTEIN: 30 mg/dL — AB

## 2015-06-02 MED ORDER — METFORMIN HCL 500 MG PO TABS
1000.0000 mg | ORAL_TABLET | Freq: Two times a day (BID) | ORAL | Status: DC
  Administered 2015-06-03 – 2015-06-04 (×3): 1000 mg via ORAL
  Filled 2015-06-02 (×5): qty 2

## 2015-06-02 MED ORDER — ACETAMINOPHEN 325 MG PO TABS
650.0000 mg | ORAL_TABLET | Freq: Once | ORAL | Status: AC
  Administered 2015-06-02: 650 mg via ORAL
  Filled 2015-06-02: qty 2

## 2015-06-02 MED ORDER — TURMERIC CURCUMIN 500 MG PO CAPS: 500.0000 mg | ORAL_CAPSULE | Freq: Every day | ORAL | Status: DC

## 2015-06-02 MED ORDER — SODIUM CHLORIDE 0.9 % IJ SOLN
3.0000 mL | Freq: Two times a day (BID) | INTRAMUSCULAR | Status: DC
  Administered 2015-06-02 – 2015-06-05 (×5): 3 mL via INTRAVENOUS

## 2015-06-02 MED ORDER — ZINC GLUCONATE 50 MG PO TABS: 50.0000 mg | ORAL_TABLET | Freq: Every day | ORAL | Status: DC

## 2015-06-02 MED ORDER — ONDANSETRON HCL 4 MG/2ML IJ SOLN
4.0000 mg | Freq: Four times a day (QID) | INTRAMUSCULAR | Status: DC | PRN
  Administered 2015-06-02 – 2015-06-04 (×3): 4 mg via INTRAVENOUS
  Filled 2015-06-02 (×4): qty 2

## 2015-06-02 MED ORDER — SODIUM CHLORIDE 0.9 % IV SOLN
INTRAVENOUS | Status: AC
  Administered 2015-06-02: via INTRAVENOUS

## 2015-06-02 MED ORDER — VITAMIN D 1000 UNITS PO TABS
2000.0000 [IU] | ORAL_TABLET | Freq: Every day | ORAL | Status: DC
  Administered 2015-06-02 – 2015-06-05 (×4): 2000 [IU] via ORAL
  Filled 2015-06-02 (×4): qty 2

## 2015-06-02 MED ORDER — CALCIUM CARB-CHOLECALCIFEROL 600-800 MG-UNIT PO TABS: 1.0000 | ORAL_TABLET | Freq: Every day | ORAL | Status: DC

## 2015-06-02 MED ORDER — PNEUMOCOCCAL VAC POLYVALENT 25 MCG/0.5ML IJ INJ
0.5000 mL | INJECTION | INTRAMUSCULAR | Status: AC
  Administered 2015-06-03: 0.5 mL via INTRAMUSCULAR
  Filled 2015-06-02: qty 0.5

## 2015-06-02 MED ORDER — SODIUM CHLORIDE 0.9 % IV BOLUS (SEPSIS)
500.0000 mL | Freq: Once | INTRAVENOUS | Status: AC
  Administered 2015-06-02: 500 mL via INTRAVENOUS

## 2015-06-02 MED ORDER — DOCUSATE SODIUM 100 MG PO CAPS
100.0000 mg | ORAL_CAPSULE | Freq: Two times a day (BID) | ORAL | Status: DC | PRN
  Administered 2015-06-04: 100 mg via ORAL
  Filled 2015-06-02: qty 1

## 2015-06-02 MED ORDER — HYDROCODONE-ACETAMINOPHEN 5-325 MG PO TABS
1.0000 | ORAL_TABLET | Freq: Four times a day (QID) | ORAL | Status: DC | PRN
  Administered 2015-06-03 (×2): 1 via ORAL
  Filled 2015-06-02 (×2): qty 1

## 2015-06-02 MED ORDER — PANTOPRAZOLE SODIUM 40 MG PO TBEC
40.0000 mg | DELAYED_RELEASE_TABLET | Freq: Every day | ORAL | Status: DC
  Administered 2015-06-02 – 2015-06-05 (×4): 40 mg via ORAL
  Filled 2015-06-02 (×4): qty 1

## 2015-06-02 MED ORDER — PRAVASTATIN SODIUM 10 MG PO TABS
10.0000 mg | ORAL_TABLET | Freq: Every day | ORAL | Status: DC
  Administered 2015-06-03 – 2015-06-04 (×2): 10 mg via ORAL
  Filled 2015-06-02 (×2): qty 1

## 2015-06-02 MED ORDER — SODIUM POLYSTYRENE SULFONATE 15 GM/60ML PO SUSP: 30.0000 g | Freq: Once | ORAL | Status: DC

## 2015-06-02 MED ORDER — PAROXETINE HCL 20 MG PO TABS
40.0000 mg | ORAL_TABLET | Freq: Every day | ORAL | Status: DC
  Administered 2015-06-02 – 2015-06-05 (×4): 40 mg via ORAL
  Filled 2015-06-02 (×4): qty 2

## 2015-06-02 MED ORDER — IOHEXOL 350 MG/ML SOLN
75.0000 mL | Freq: Once | INTRAVENOUS | Status: AC | PRN
  Administered 2015-06-02: 75 mL via INTRAVENOUS

## 2015-06-02 MED ORDER — ONDANSETRON HCL 4 MG/2ML IJ SOLN
4.0000 mg | Freq: Once | INTRAMUSCULAR | Status: AC
  Administered 2015-06-02: 4 mg via INTRAVENOUS
  Filled 2015-06-02: qty 2

## 2015-06-02 MED ORDER — ADULT MULTIVITAMIN W/MINERALS CH
1.0000 | ORAL_TABLET | Freq: Every day | ORAL | Status: DC
  Administered 2015-06-02 – 2015-06-05 (×4): 1 via ORAL
  Filled 2015-06-02 (×4): qty 1

## 2015-06-02 MED ORDER — MORPHINE SULFATE (PF) 4 MG/ML IV SOLN
4.0000 mg | Freq: Once | INTRAVENOUS | Status: AC
  Administered 2015-06-02: 4 mg via INTRAVENOUS
  Filled 2015-06-02: qty 1

## 2015-06-02 MED ORDER — ZINC SULFATE 220 (50 ZN) MG PO CAPS
220.0000 mg | ORAL_CAPSULE | Freq: Every day | ORAL | Status: DC
  Administered 2015-06-02 – 2015-06-05 (×4): 220 mg via ORAL
  Filled 2015-06-02 (×4): qty 1

## 2015-06-02 MED ORDER — ASPIRIN EC 81 MG PO TBEC
81.0000 mg | DELAYED_RELEASE_TABLET | Freq: Every day | ORAL | Status: DC
  Administered 2015-06-02 – 2015-06-05 (×4): 81 mg via ORAL
  Filled 2015-06-02 (×4): qty 1

## 2015-06-02 MED ORDER — DOCUSATE SODIUM 100 MG PO CAPS
100.0000 mg | ORAL_CAPSULE | Freq: Two times a day (BID) | ORAL | Status: DC
  Administered 2015-06-02 – 2015-06-03 (×2): 100 mg via ORAL
  Filled 2015-06-02 (×2): qty 1

## 2015-06-02 MED ORDER — LORATADINE 10 MG PO TABS
10.0000 mg | ORAL_TABLET | Freq: Every day | ORAL | Status: DC
  Administered 2015-06-02 – 2015-06-05 (×4): 10 mg via ORAL
  Filled 2015-06-02 (×4): qty 1

## 2015-06-02 MED ORDER — LISINOPRIL 10 MG PO TABS
10.0000 mg | ORAL_TABLET | Freq: Every day | ORAL | Status: DC
  Administered 2015-06-02 – 2015-06-05 (×3): 10 mg via ORAL
  Filled 2015-06-02 (×4): qty 1

## 2015-06-02 MED ORDER — GLIPIZIDE ER 10 MG PO TB24
10.0000 mg | ORAL_TABLET | Freq: Every day | ORAL | Status: DC
  Administered 2015-06-02 – 2015-06-05 (×4): 10 mg via ORAL
  Filled 2015-06-02 (×5): qty 1

## 2015-06-02 MED ORDER — INSULIN ASPART 100 UNIT/ML IV SOLN
10.0000 [IU] | Freq: Once | INTRAVENOUS | Status: DC
  Filled 2015-06-02: qty 0.1

## 2015-06-02 MED ORDER — DIMENHYDRINATE 50 MG PO TABS
50.0000 mg | ORAL_TABLET | Freq: Three times a day (TID) | ORAL | Status: DC | PRN
  Filled 2015-06-02: qty 1

## 2015-06-02 MED ORDER — METOPROLOL TARTRATE 25 MG PO TABS
12.5000 mg | ORAL_TABLET | Freq: Two times a day (BID) | ORAL | Status: DC
  Administered 2015-06-02 – 2015-06-05 (×5): 12.5 mg via ORAL
  Filled 2015-06-02 (×6): qty 1

## 2015-06-02 MED ORDER — DEXTROSE 50 % IV SOLN: 1.0000 | Freq: Once | INTRAVENOUS | Status: DC

## 2015-06-02 MED ORDER — METHOCARBAMOL 500 MG PO TABS
500.0000 mg | ORAL_TABLET | Freq: Four times a day (QID) | ORAL | Status: DC | PRN
  Administered 2015-06-03: 500 mg via ORAL
  Filled 2015-06-02 (×2): qty 1

## 2015-06-02 MED ORDER — ENOXAPARIN SODIUM 40 MG/0.4ML ~~LOC~~ SOLN
40.0000 mg | Freq: Two times a day (BID) | SUBCUTANEOUS | Status: DC
  Administered 2015-06-02 – 2015-06-05 (×6): 40 mg via SUBCUTANEOUS
  Filled 2015-06-02 (×6): qty 0.4

## 2015-06-02 MED ORDER — CALCIUM CARBONATE-VITAMIN D 500-200 MG-UNIT PO TABS
1.0000 | ORAL_TABLET | Freq: Every day | ORAL | Status: DC
  Administered 2015-06-02 – 2015-06-05 (×4): 1 via ORAL
  Filled 2015-06-02 (×4): qty 1

## 2015-06-02 MED ORDER — HYDROXYZINE HCL 10 MG PO TABS: 25.0000 mg | ORAL_TABLET | Freq: Three times a day (TID) | ORAL | Status: DC | PRN

## 2015-06-02 MED ORDER — SENNOSIDES-DOCUSATE SODIUM 8.6-50 MG PO TABS: 1.0000 | ORAL_TABLET | Freq: Every evening | ORAL | Status: DC | PRN

## 2015-06-02 MED ORDER — METOPROLOL TARTRATE 25 MG PO TABS: 25.0000 mg | ORAL_TABLET | Freq: Two times a day (BID) | ORAL | Status: DC

## 2015-06-02 MED ORDER — IBUPROFEN 400 MG PO TABS
800.0000 mg | ORAL_TABLET | Freq: Three times a day (TID) | ORAL | Status: DC | PRN
  Administered 2015-06-03: 800 mg via ORAL
  Filled 2015-06-02: qty 4

## 2015-06-02 MED ORDER — ONDANSETRON HCL 4 MG PO TABS: 4.0000 mg | ORAL_TABLET | Freq: Four times a day (QID) | ORAL | Status: DC | PRN

## 2015-06-02 MED ORDER — CLONAZEPAM 1 MG PO TABS
1.0000 mg | ORAL_TABLET | Freq: Two times a day (BID) | ORAL | Status: DC
  Administered 2015-06-02 – 2015-06-05 (×6): 1 mg via ORAL
  Filled 2015-06-02 (×6): qty 1

## 2015-06-02 MED ORDER — OXYCODONE-ACETAMINOPHEN 5-325 MG PO TABS
1.0000 | ORAL_TABLET | Freq: Four times a day (QID) | ORAL | Status: DC | PRN
  Administered 2015-06-02 – 2015-06-04 (×4): 2 via ORAL
  Filled 2015-06-02 (×5): qty 2

## 2015-06-02 MED ORDER — FERROUS SULFATE 325 (65 FE) MG PO TABS
325.0000 mg | ORAL_TABLET | Freq: Two times a day (BID) | ORAL | Status: DC
  Administered 2015-06-03 – 2015-06-05 (×5): 325 mg via ORAL
  Filled 2015-06-02 (×5): qty 1

## 2015-06-02 MED ORDER — SODIUM CHLORIDE 0.9 % IV SOLN
1.0000 g | INTRAVENOUS | Status: DC
  Filled 2015-06-02: qty 10

## 2015-06-02 NOTE — Clinical Social Work Note (Signed)
Clinical Social Work Assessment  Patient Details  Name: Sharon Kirby MRN: [id number] Date of Birth: 27-Feb-1951  Date of referral:  06/02/15               Reason for consult:  Facility Placement                Permission sought to share information with:    Permission granted to share information::     Name::     Son  Agency::  [facility]NF[facility]  Relationship::  HCPOA/son  Contact Information:     Housing/Transportation Living arrangements for the past 2 months:  Single Family Home Source of Information:  Patient, Medical Team, Adult Children Patient Interpreter Needed:  None Criminal Activity/Legal Involvement Pertinent to Current Situation/Hospitalization:  No - Comment as needed Significant Relationships:  Adult Children Lives with:  Adult Children Do you feel safe going back to the place where you live?  No Need for family participation in patient care:  Yes (Comment)  Care giving concerns:  Pt lives with her son who requires assistance devices for his own mobility. Family is limited in providing patient with physical assistance.    Social Worker assessment / plan:  CSW was contacted while Pt was in the ED for discharge to rehab facility. CSW met with Pt while son/HCPOA was at bedside. Pt's family shared that they have not been able to help Pt since she fell and injured her knee a few days ago. Pt uses devises at home but was able to transfer herself and walk around with a walker. Pt's insurance requires authorization but is able to be done while Pt is in ED. CSW discussed with family and EDP. CSW initiated bed search, Pt has options for STR in the area.   MD notified CSW that Pt might need to be admitted now due to abnormal workup. CSW will continue to follow up for dc planning.   Employment status:  Disabled (Comment on whether or not currently receiving Disability) Insurance information:  Managed Medicare PT Recommendations:    Information / Referral to community resources:  [place]  Patient/Family's Response to care:  Pt and family have been engaged with treatment team. They are in agreement with STR. Pt has been in STR over 10 years ago when she had a knee replacement. Pt is agreeable again at this time.   Patient/Family's Understanding of and Emotional Response to Diagnosis, Current Treatment, and Prognosis:  Pt's family states that they feel limited by their ability to help the patient and believe that she can recover some of her independence after rehabilitation. Pt agrees with plan as well.   Emotional Assessment Appearance:  Appears older than stated age, Disheveled Attitude/Demeanor/Rapport:   (cooperative) Affect (typically observed):  Accepting, Appropriate Orientation:  Oriented to Self, Oriented to Place, Oriented to  Time, Oriented to Situation Alcohol / Substance use:  Never Used Psych involvement (Current and /or in the community):  Outpatient Provider  Discharge Needs  Concerns to be addressed:  Adjustment to Illness, Discharge Planning Concerns Readmission within the last 30 days:  No Current discharge risk:  Dependent with Mobility, Chronically ill Barriers to Discharge:  Continued Medical Work up, [place], LCSW 06/02/2015, 6:35 PM

## 2015-06-02 NOTE — NC FL2 (Signed)
[place] LEVEL OF CARE SCREENING TOOL     IDENTIFICATION  Patient Name: Sharon Kirby Birthdate: 07-29-1950 Sex: female Admission Date (Current Location): 06/02/2015  [place] and [place] Number: Engineering geologist and Address:  [facility], [address] [place], [place] [address]      Provider Number: [id number]  Attending Physician Name and Address:  Orbie Pyo,*  Relative Name and Phone Number:       Current Level of Care: Hospital Recommended Level of Care: [place] Prior Approval Number:    Date Approved/Denied:   PASRR Number: [id number]  Discharge Plan: SNF    Current Diagnoses: Patient Active Problem List   Diagnosis Date Noted  . Acute blood loss anemia 04/13/2015  . Iron deficiency anemia   . Angiodysplasia of intestinal tract   . GI bleed 04/09/2015    Orientation ACTIVITIES/SOCIAL BLADDER RESPIRATION    Self, Place  Active Incontinent Normal  BEHAVIORAL SYMPTOMS/MOOD NEUROLOGICAL BOWEL NUTRITION STATUS   (none)  (none) Continent Diet  PHYSICIAN VISITS COMMUNICATION OF NEEDS Height & Weight Skin  30 days Verbally   303 lbs. Normal          AMBULATORY STATUS RESPIRATION    Assist extensive Normal      Personal Care Assistance Level of Assistance  Total care       Total Care Assistance: Maximum assistance    Functional Limitations Info   (none)             SPECIAL CARE FACTORS FREQUENCY  PT (By licensed PT), OT (By licensed OT)                   Additional Factors Info  Code Status, Allergies (Patient status post fall at home) Code Status Info: full code Allergies Info: pcn's and sulfa abx           Current Medications (06/02/2015): No current facility-administered medications for this encounter.   Current Outpatient Prescriptions  Medication Sig Dispense Refill  . aspirin EC 81 MG tablet Take 81 mg by mouth daily.    . Calcium  Carb-Cholecalciferol (CALCIUM 600+D) 600-800 MG-UNIT TABS Take 1 tablet by mouth daily.    . cetirizine (ZYRTEC) 10 MG tablet Take 10 mg by mouth daily.    . Cholecalciferol (VITAMIN D) 2000 UNITS CAPS Take 2,000 Units by mouth daily.    . clonazePAM (KLONOPIN) 1 MG tablet Take 1 mg by mouth 2 (two) times daily.    Marland Kitchen dimenhyDRINATE (DRAMAMINE) 50 MG tablet Take 50 mg by mouth every 8 (eight) hours as needed for nausea.    Marland Kitchen docusate sodium (COLACE) 100 MG capsule Take 100 mg by mouth 2 (two) times daily as needed for mild constipation.    . ferrous sulfate 325 (65 FE) MG tablet Take 1 tablet (325 mg total) by mouth 2 (two) times daily with a meal. 120 tablet 0  . glipiZIDE (GLUCOTROL XL) 10 MG 24 hr tablet Take 10 mg by mouth daily.    Marland Kitchen HYDROcodone-acetaminophen (NORCO/VICODIN) 5-325 MG tablet Take 1 tablet by mouth every 6 (six) hours as needed for moderate pain.    . hydrOXYzine (ATARAX/VISTARIL) 25 MG tablet Take 25 mg by mouth 3 (three) times daily as needed for itching.    Marland Kitchen ibuprofen (ADVIL,MOTRIN) 800 MG tablet Take 800 mg by mouth every 8 (eight) hours as needed for mild pain.    Marland Kitchen lisinopril (PRINIVIL,ZESTRIL) 10 MG tablet Take 10 mg by  mouth daily.    . metFORMIN (GLUCOPHAGE) 1000 MG tablet Take 1,000 mg by mouth 2 (two) times daily with a meal.    . methocarbamol (ROBAXIN) 500 MG tablet Take 500 mg by mouth every 6 (six) hours as needed for muscle spasms.    . Multiple Vitamin (MULTIVITAMIN WITH MINERALS) TABS tablet Take 1 tablet by mouth daily.    Marland Kitchen omeprazole (PRILOSEC) 20 MG capsule Take 20 mg by mouth daily.    Marland Kitchen PARoxetine (PAXIL) 40 MG tablet Take 40 mg by mouth daily.    . Potassium Gluconate 550 MG TABS Take 550 mg by mouth daily.    . Turmeric Curcumin 500 MG CAPS Take 500 mg by mouth daily.    Marland Kitchen zinc gluconate 50 MG tablet Take 50 mg by mouth daily.    Marland Kitchen oxyCODONE-acetaminophen (ROXICET) 5-325 MG tablet Take 1 tablet by mouth every 6 (six) hours as needed for severe pain.  (Patient not taking: Reported on 06/02/2015) 6 tablet 0  . traMADol (ULTRAM) 50 MG tablet Take 1 tablet (50 mg total) by mouth every 8 (eight) hours as needed for moderate pain or severe pain. (Patient not taking: Reported on 06/02/2015) 30 tablet 0   Do not use this list as official medication orders. Please verify with discharge summary.  Discharge Medications:   Medication List    ASK your doctor about these medications        aspirin EC 81 MG tablet  Take 81 mg by mouth daily.     CALCIUM 600+D 600-800 MG-UNIT Tabs  Generic drug:  Calcium Carb-Cholecalciferol  Take 1 tablet by mouth daily.     cetirizine 10 MG tablet  Commonly known as:  ZYRTEC  Take 10 mg by mouth daily.     clonazePAM 1 MG tablet  Commonly known as:  KLONOPIN  Take 1 mg by mouth 2 (two) times daily.     dimenhyDRINATE 50 MG tablet  Commonly known as:  DRAMAMINE  Take 50 mg by mouth every 8 (eight) hours as needed for nausea.     docusate sodium 100 MG capsule  Commonly known as:  COLACE  Take 100 mg by mouth 2 (two) times daily as needed for mild constipation.     ferrous sulfate 325 (65 FE) MG tablet  Take 1 tablet (325 mg total) by mouth 2 (two) times daily with a meal.     glipiZIDE 10 MG 24 hr tablet  Commonly known as:  GLUCOTROL XL  Take 10 mg by mouth daily.     HYDROcodone-acetaminophen 5-325 MG tablet  Commonly known as:  NORCO/VICODIN  Take 1 tablet by mouth every 6 (six) hours as needed for moderate pain.     hydrOXYzine 25 MG tablet  Commonly known as:  ATARAX/VISTARIL  Take 25 mg by mouth 3 (three) times daily as needed for itching.     ibuprofen 800 MG tablet  Commonly known as:  ADVIL,MOTRIN  Take 800 mg by mouth every 8 (eight) hours as needed for mild pain.     lisinopril 10 MG tablet  Commonly known as:  PRINIVIL,ZESTRIL  Take 10 mg by mouth daily.     metFORMIN 1000 MG tablet  Commonly known as:  GLUCOPHAGE  Take 1,000 mg by mouth 2 (two) times daily with a meal.      methocarbamol 500 MG tablet  Commonly known as:  ROBAXIN  Take 500 mg by mouth every 6 (six) hours as needed for muscle spasms.  multivitamin with minerals Tabs tablet  Take 1 tablet by mouth daily.     omeprazole 20 MG capsule  Commonly known as:  PRILOSEC  Take 20 mg by mouth daily.     oxyCODONE-acetaminophen 5-325 MG tablet  Commonly known as:  ROXICET  Take 1 tablet by mouth every 6 (six) hours as needed for severe pain.     PARoxetine 40 MG tablet  Commonly known as:  PAXIL  Take 40 mg by mouth daily.     Potassium Gluconate 550 MG Tabs  Take 550 mg by mouth daily.     traMADol 50 MG tablet  Commonly known as:  ULTRAM  Take 1 tablet (50 mg total) by mouth every 8 (eight) hours as needed for moderate pain or severe pain.     Turmeric Curcumin 500 MG Caps  Take 500 mg by mouth daily.     Vitamin D 2000 UNITS Caps  Take 2,000 Units by mouth daily.     zinc gluconate 50 MG tablet  Take 50 mg by mouth daily.        Relevant Imaging Results:  Relevant Lab Results:  Recent Labs    Additional Information SS: [id number]  Shela Leff, LCSW

## 2015-06-02 NOTE — ED Notes (Signed)
Pt to ct 

## 2015-06-02 NOTE — ED Provider Notes (Addendum)
[facility] Emergency Department Provider Note  ____________________________________________  Time seen: Seen upon arrival to the emergency department  I have reviewed the triage vital signs and the nursing notes.   HISTORY  Chief Complaint Back Pain    HPI Sharon Kirby is a 64 y.o. female history of a right knee replacement and morbid obesity who is presenting today with new onset back pain. The patient has been having right knee painsince falling on a kitchen chair 5 days ago. She has been evaluated in this emergency department and was diagnosed with a soft tissue contusion. She had x-rays which showed no acute injury to the right knee which also has arthroplasty present. She has been having difficulty with ambulation and her family says it is impossible to care for home. The patient's husband has multiple sclerosis and uses a walker. The patient uses a walker in the house in a scooter outside the home. However, they have not been able to get her even to the bathroom over the past several days because of her increased pain and difficulty with ambulation. While moving her last night to reposition her she felt a "pop" in her back and ever since then has been having severe back pain both at rest and with movement. She says it is to her lower back. The family says that she also passed out 3 times during episodes of severe pain. They said that the maximum amount of time was 30-45 seconds. The patient denies any chest pain or shortness of breath at this time. However, she says when the pain worsens she does have episodes with shortness of breath. She denies any loss of bowel or bladder continence. She denies any weakness or radiation of the pain in the legs. She says that it stays in her lower back. Denies any numbness to the lower extremities.   Past Medical History  Diagnosis Date  . Seizures ([place])   . Stroke ([place])   . Brain tumor ([place])   . Diabetes mellitus without  complication ([place])   . Arthritis   . Sleep apnea   . GERD (gastroesophageal reflux disease)   . Asthma   . Long QT interval   . Hypertension   . Panic attacks   . Anxiety   . Depression   . Syncope     Patient Active Problem List   Diagnosis Date Noted  . Acute blood loss anemia 04/13/2015  . Iron deficiency anemia   . Angiodysplasia of intestinal tract   . GI bleed 04/09/2015    Past Surgical History  Procedure Laterality Date  . Csf shunt      x2 due to tumor  . Cholecystectomy    . Tonsillectomy    . Carpal tunnel release Left   . Trigger finger release Left   . Knee surgery Left   . Colonoscopy with propofol N/A 04/10/2015    Procedure: COLONOSCOPY WITH PROPOFOL;  Surgeon: Lucilla Lame, MD;  Location: [facility] ENDOSCOPY;  Service: Endoscopy;  Laterality: N/A;  . Esophagogastroduodenoscopy (egd) with propofol N/A 04/10/2015    Procedure: ESOPHAGOGASTRODUODENOSCOPY (EGD) WITH PROPOFOL;  Surgeon: Lucilla Lame, MD;  Location: [facility] ENDOSCOPY;  Service: Endoscopy;  Laterality: N/A;    Current Outpatient Rx  Name  Route  Sig  Dispense  Refill  . ferrous sulfate 325 (65 FE) MG tablet   Oral   Take 1 tablet (325 mg total) by mouth 2 (two) times daily with a meal.   120 tablet   0   .  oxyCODONE-acetaminophen (ROXICET) 5-325 MG tablet   Oral   Take 1 tablet by mouth every 6 (six) hours as needed for severe pain.   6 tablet   0   . traMADol (ULTRAM) 50 MG tablet   Oral   Take 1 tablet (50 mg total) by mouth every 8 (eight) hours as needed for moderate pain or severe pain.   30 tablet   0     Allergies Sulfa antibiotics and Penicillins  No family history on file.  Social History Social History  Substance Use Topics  . Smoking status: Never Smoker   . Smokeless tobacco: Never Used  . Alcohol Use: No    Review of Systems Constitutional: No fever/chills Eyes: No visual changes. ENT: No sore throat. Cardiovascular: Denies chest pain. Respiratory: As above   Gastrointestinal: No abdominal pain.  No nausea, no vomiting.  No diarrhea.  No constipation. Genitourinary: Negative for dysuria. Musculoskeletal: As above  Skin: Negative for rash. Neurological: Negative for headaches, focal weakness or numbness.  10-point ROS otherwise negative.  ____________________________________________   PHYSICAL EXAM:  VITAL SIGNS: ED Triage Vitals  Enc Vitals Group     BP 06/02/15 1341 136/84 mmHg     Pulse Rate 06/02/15 1341 109     Resp 06/02/15 1341 18     Temp 06/02/15 1341 99.9 F (37.7 C)     Temp Source 06/02/15 1341 Oral     SpO2 06/02/15 1341 93 %     Weight 06/02/15 1341 303 lb (137.44 kg)     Height 06/02/15 1341 5' (1.524 m)     Head Cir --      Peak Flow --      Pain Score --      Pain Loc --      Pain Edu? --      Excl. in [place]? --     Constitutional: Alert and oriented. In no acute distress the patient is tearful.  Eyes: Conjunctivae are normal. PERRL. EOMI. Head: Atraumatic. Nose: No congestion/rhinnorhea. Mouth/Throat: Mucous membranes are moist.  Oropharynx non-erythematous. Neck: No stridor.   Cardiovascular: Tachycardic, regular rhythm. Grossly normal heart sounds.  Good peripheral circulation. Respiratory: Normal respiratory effort.  No retractions. Lungs CTAB. Gastrointestinal: Soft and nontender. No distention. No abdominal bruits. No CVA tenderness. Musculoskeletal: Moderate edema to the bilateral lower extremities. Dorsalis pedis pulses are present and equal. 5 out of 5 strength to the great toes to dorsiflexion as well as the ankles.  No saddle anesthesia. Tenderness palpation across the lumbar spine region. There is no exquisite midline tenderness step-off or deformity. Right knee without effusion. However, tender diffusely. No erythema or warmth. There is no ligamentous laxity but there is pain throughout with palpation as well as ligamentous stressing. Neurologic:  Normal speech and language. No gross focal neurologic  deficits are appreciated.  Skin:   erythematous skin to the bilateral posterior thighs. Small areas of ulceration to stage II. Psychiatric: Mood and affect are normal. Speech and behavior are normal.  ____________________________________________   LABS (all labs ordered are listed, but only abnormal results are displayed)  Labs Reviewed  CBC WITH DIFFERENTIAL/PLATELET  BASIC METABOLIC PANEL  TROPONIN I   ____________________________________________  EKG  ED ECG REPORT I, Pasqual Farias,  Youlanda Roys, the attending physician, personally viewed and interpreted this ECG.   Date: 06/02/2015  EKG Time: 1504  Rate: 103  Rhythm: sinus tachycardia  Axis: Normal axis  Intervals:right bundle branch block  ST&T Change: T-wave inversion in V2. No  ST segment elevation or depression.  There is no change from EKG done on 04/09/2015.  ____________________________________________  RADiology  Chest exam with elevation of the right hemidiaphragm. No active disease. No acute fracture to the lumbar spine. 8 mm anterior listhesis L5 on S1. Disc space flattening T12-L1 and L5-S1.  No evidence of pulmonary embolism. Loss the lower lobes. Cirrhosis with portal hypertension.  PROCEDURES  CRITICAL CARE Performed by: Doran Stabler   Total critical care time: 35 minutes  Critical care time was exclusive of separately billable procedures and treating other patients.  Critical care was necessary to treat or prevent imminent or life-threatening deterioration.  Critical care was time spent personally by me on the following activities: development of treatment plan with patient and/or surrogate as well as nursing, discussions with consultants, evaluation of patient's response to treatment, examination of patient, obtaining history from patient or surrogate, ordering and performing treatments and interventions, ordering and review of laboratory studies, ordering and review of radiographic studies, pulse  oximetry and re-evaluation of patient's condition.  Critical care time billed for elevated troponin. ____________________________________________   INITIAL IMPRESSION / ASSESSMENT AND PLAN / ED COURSE  Pertinent labs & imaging results that were available during my care of the patient were reviewed by me and considered in my medical decision making (see chart for details).  ----------------------------------------- 4:45 PM on 06/02/2015 -----------------------------------------  Patient's labs show positive troponin. Patient is tachycardic with a mildly low blood oxygen level. We'll scan chest to rule out pulmonary embolus. The patient does have risk factors for this including immobility over the past week or so.  ----------------------------------------- 6:03 PM on 06/02/2015 -----------------------------------------  Patient still with back pain after first dose of morphine. Is requesting a second dose of pain medications. Because of the patient's requirement for IV pain meds to be admitted to the hospital for uncontrolled pain secondary to her falls. She does have an elevated troponin but no changes on her EKG. I suspect this is from demand. The patient and family aware of the plan. I signed out the Patient to Dr. Posey Pronto of the Medicine Service. ____________________________________________   FINAL CLINICAL IMPRESSION(S) / ED DIAGNOSES  Intractable back as well as knee pain.  Orbie Pyo, MD 06/02/15 1805  Do not suspect cord compression at this time. No saddle anesthesia and no distal weakness. There is furthermore no radiation of the back pain down the legs.   Orbie Pyo, MD 06/02/15 1807  Patient tachycardic with elevated troponin and elevated white count. However does not have obvious source of infection. We are still waiting for urine. She does have erythema to the posterior of her legs and inferior buttocks but the husband says that this is  actually improved from previous and the patient uses a cream to help with this issue.  Orbie Pyo, MD 06/02/15 [phone]

## 2015-06-02 NOTE — ED Notes (Signed)
Patient at xray, will start orders when patient returns

## 2015-06-02 NOTE — Evaluation (Signed)
Physical Therapy Evaluation Patient Details Name: Sharon Kirby MRN: [id number] DOB: [birth year] Today's Date: 06/02/2015   History of Present Illness  presented to ER after fall 3 days prior with significant R knee pain and inability to mobilize in home environment since.  R knee x-ray negative for acute injury; lumbar spine negative for acute injury, significant for 38mm anterolisthesis L5-S1  Clinical Impression  Upon evaluation, patient alert and oriented to basic information; follows commands and agreeable to participation with session.  Bilat LE strength and ROM grossly WFL for basic mobility, but limited by pain (R > L).  Currently requiring max assist +2 for bed mobility; mod assist +2 for sit/stand and very short-stepping towards head of bed with RW. Maintains very forward flexed posture with heavy reliance on RW.  Unable/unsafe to trial additional mobility at this time.  Vitals stable and WFL throughout session.  Patient did report slight improvement in pain levels with transition to upright during session. Of note, patient lab values and additional testing entered into computer system during evaluation session. Noted with hyperkalemia, mild elevation in troponin and orders for chest CT to rule out PE.  Will need to follow up on these prior to additional activity with therapy. Would benefit from skilled PT to address above deficits and promote optimal return to PLOF; recommend transition to STR upon discharge from acute hospitalization.  RN informed/aware.     Follow Up Recommendations SNF    Equipment Recommendations       Recommendations for Other Services       Precautions / Restrictions Precautions Precautions: Fall Restrictions Weight Bearing Restrictions: No      Mobility  Bed Mobility Overal bed mobility: +2 for physical assistance;Needs Assistance Bed Mobility: Supine to Sit;Sit to Supine     Supine to sit: Total assist;+2 for physical assistance Sit to supine:  Total assist;+2 for physical assistance      Transfers Overall transfer level: Needs assistance Equipment used: Rolling walker (2 wheeled) Transfers: Sit to/from Stand Sit to Stand: Mod assist;+2 physical assistance         General transfer comment: requires UE assist pulling on RW; unable to achieve fully erect posture, tends to maintain UEs 'draped' over RW due to back pain  Ambulation/Gait Ambulation/Gait assistance: Mod assist;+2 physical assistance Ambulation Distance (Feet): 5 Feet Assistive device: Rolling walker (2 wheeled)       General Gait Details: lateral steps towards head of bed, forward flexed posture, very short/shuffling steps  Stairs            Wheelchair Mobility    Modified Rankin (Stroke Patients Only)       Balance Overall balance assessment: Needs assistance Sitting-balance support: No upper extremity supported;Feet supported Sitting balance-Leahy Scale: Fair Sitting balance - Comments: limited ability to shift weight outside immediate BOS due to pain     Standing balance-Leahy Scale: Fair Standing balance comment: unable to achieve fully erect posture                             Pertinent Vitals/Pain Pain Assessment: 0-10 Pain Score: 10-Worst pain ever Pain Location: 10/10 in back, 3/10 in R knee Pain Descriptors / Indicators: Aching Pain Intervention(s): Limited activity within patient's tolerance;Monitored during session;Premedicated before session;Repositioned    Home Living Family/patient expects to be discharged to:: Private residence Living Arrangements: Children (son has MS and is able to provide limited physical assist) Available Help at Discharge: Family;Available 24 hours/day  Type of Home: House Home Access: Ramped entrance (+1 step)     Home Layout: One level Home Equipment: Walker - 2 wheels;Walker - 4 wheels;Cane - single point;Electric scooter;Hospital bed;Shower seat - built in      Prior Function  Level of Independence: Needs assistance   Gait / Transfers Assistance Needed: Patient was ambulating short distances (bed to bathroom) with assistance and rollator (per previous documentation)  ADL's / Homemaking Assistance Needed: Patient was requiring assistance with preparing meals/bathing/toileting, etc. (per previous documentation)  Comments: Since fall prior to admission, unable for OOB or household mobilization due to back and R knee pain     Hand Dominance        Extremity/Trunk Assessment   Upper Extremity Assessment:  (significant limitations in shoulder elevation due to chronic arthritic changes)           Lower Extremity Assessment:  (L LE grossly 3+/5, R LE 2+ to 3-/5.  Tolerating R knee flexion to approx 75 degrees, limited by pain)         Communication   Communication: No difficulties  Cognition Arousal/Alertness: Awake/alert Behavior During Therapy: WFL for tasks assessed/performed Overall Cognitive Status: Within Functional Limits for tasks assessed                      General Comments      Exercises Other Exercises Other Exercises: Education for relaxation and positioning to improve pain control.  Sitting balance with close sup with accommodation to position with improvement in pain control noted.  Standing balance with RW, mod assist +2 for safety due to fall risk and pain levels.      Assessment/Plan    PT Assessment Patient needs continued PT services  PT Diagnosis Difficulty walking;Generalized weakness;Acute pain   PT Problem List Decreased strength;Decreased range of motion;Decreased activity tolerance;Decreased balance;Decreased mobility;Decreased knowledge of use of DME;Pain;Cardiopulmonary status limiting activity;Decreased safety awareness  PT Treatment Interventions DME instruction;Stair training;Gait training;Functional mobility training;Therapeutic activities;Therapeutic exercise;Balance training;Patient/family education   PT  Goals (Current goals can be found in the Care Plan section) Acute Rehab PT Goals Patient Stated Goal: "I hurt so bad" PT Goal Formulation: With patient/family Time For Goal Achievement: 06/16/15 Potential to Achieve Goals: Fair    Frequency Min 2X/week   Barriers to discharge Decreased caregiver support      Co-evaluation               End of Session Equipment Utilized During Treatment: Gait belt Activity Tolerance: Patient limited by pain Patient left: in bed;with call bell/phone within reach;with family/visitor present Nurse Communication: Mobility status (discharge recommendations)    Functional Assessment Tool Used: clinical judgement Functional Limitation: Mobility: Walking and moving around Mobility: Walking and Moving Around Current Status [phone]): At least 80 percent but less than 100 percent impaired, limited or restricted Mobility: Walking and Moving Around Goal Status [phone]): At least 20 percent but less than 40 percent impaired, limited or restricted    Time: [id number] PT Time Calculation (min) (ACUTE ONLY): 22 min   Charges:   PT Evaluation $Initial PT Evaluation Tier I: 1 Procedure PT Treatments $Therapeutic Activity: 8-22 mins   PT G Codes:   PT G-Codes **NOT FOR INPATIENT CLASS** Functional Assessment Tool Used: clinical judgement Functional Limitation: Mobility: Walking and moving around Mobility: Walking and Moving Around Current Status [id number]): At least 80 percent but less than 100 percent impaired, limited or restricted Mobility: Walking and Moving Around Goal Status [phone]): At least 20 percent  but less than 40 percent impaired, limited or restricted   Acie Custis H. Owens Shark, PT, DPT, NCS 06/02/2015, 5:37 PM [phone]

## 2015-06-02 NOTE — Clinical Social Work Placement (Signed)
   CLINICAL SOCIAL WORK PLACEMENT  NOTE  Date:  06/02/2015  Patient Details  Name: Sharon Kirby MRN: [id number] Date of Birth: 1950/10/26  Clinical Social Work is seeking post-discharge placement for this patient at the [place] level of care (*CSW will initial, date and re-position this form in  chart as items are completed):      Patient/family provided with [place] Work Department's list of facilities offering this level of care within the geographic area requested by the patient (or if unable, by the patient's family).      Patient/family informed of their freedom to choose among providers that offer the needed level of care, that participate in Medicare, Medicaid or managed care program needed by the patient, have an available bed and are willing to accept the patient.      Patient/family informed of [facility]'s ownership interest in [facility] and [facility], as well as of the fact that they are under no obligation to receive care at these facilities.  PASRR submitted to EDS on 06/02/15     PASRR number received on 06/02/15     Existing PASRR number confirmed on       FL2 transmitted to all facilities in geographic area requested by pt/family on       FL2 transmitted to all facilities within larger geographic area on       Patient informed that his/her managed care company has contracts with or will negotiate with certain facilities, including the following:            Patient/family informed of bed offers received.  Patient chooses bed at       Physician recommends and patient chooses bed at      Patient to be transferred to   on  .  Patient to be transferred to facility by       Patient family notified on   of transfer.  Name of family member notified:        PHYSICIAN       Additional Comment:    _______________________________________________ Alonna Buckler, LCSW 06/02/2015, 6:59 PM

## 2015-06-02 NOTE — ED Notes (Signed)
Patient incontinent of urine.  Sheets were saturated, does not appear to have acute urinary retention at this time.  Linens changed, pericare performed, barrier cream applied.

## 2015-06-02 NOTE — Care Management Note (Signed)
Case Management Note  Patient Details  Name: Sharon Kirby MRN: [id number] Date of Birth: 1951-06-17  Subjective/Objective:  I have followed back to talk to the family and patient, who say the patient had in home PT years ago, but they do not know which agency. I have asked if they would consider Home Health physical therapy, and they strongly decline as they say the patient has been in bed since they got her home Saturday. It was only with the help of EMS that they were able to roll the patient at all. MD made aware.                   Action/Plan:   Expected Discharge Date:                  Expected Discharge Plan:     In-House Referral:     Discharge planning Services     Post Acute Care Choice:    Choice offered to:     DME Arranged:    DME Agency:     HH Arranged:    [place] Agency:     Status of Service:     Medicare Important Message Given:    Date Medicare IM Given:    Medicare IM give by:    Date Additional Medicare IM Given:    Additional Medicare Important Message give by:     If discussed at [place] of Stay Meetings, dates discussed:    Additional Comments:  Beau Fanny, RN 06/02/2015, 2:32 PM

## 2015-06-02 NOTE — ED Notes (Signed)
Gave patient graham crackers and beverage. No acute distress noted.

## 2015-06-02 NOTE — ED Notes (Signed)
C/o back pain in addition to chronic right leg pain.  According to EMS, patient was being moved by family last night when she felt something pop in her back.  No fall or additional injury.  Tearful during triage.  Also, patient is very obese.  At baseline, patient is able to bear some weight with assistance and pivot from bed to toilet.

## 2015-06-02 NOTE — Care Management Note (Signed)
Case Management Note  Patient Details  Name: ROSELEEN YARN MRN: [id number] Date of Birth: 23-Mar-1951  Subjective/Objective:     Saw patient and family in room per Dr Caro Laroche. The family and pt. Both describe that the pt. Fell Saturday and was seen here. X-rays cleared the pt. And she has experienced excruciating pain ever since.  The family is unable to move the patient for all ADL's. The patient screams loudly at any attempt to roll or position her. They are using Diapers at home because the pt. Is unable to manouver onto a bedpan.      She is able to feed herself once set up, but at this point the knee pain is all encompassing. The family was hoping the pt. Would be able to place in a rehab due to inability to mobilize at present. The MD is going to try IV pain meds and re-assess.          Action/Plan:   Expected Discharge Date:                  Expected Discharge Plan:     In-House Referral:     Discharge planning Services     Post Acute Care Choice:    Choice offered to:     DME Arranged:    DME Agency:     HH Arranged:    [place] Agency:     Status of Service:     Medicare Important Message Given:    Date Medicare IM Given:    Medicare IM give by:    Date Additional Medicare IM Given:    Additional Medicare Important Message give by:     If discussed at [place] of Stay Meetings, dates discussed:    Additional Comments:  Beau Fanny, RN 06/02/2015, 2:11 PM

## 2015-06-02 NOTE — H&P (Signed)
[facility] NAME: Sharon Kirby    MR#:  [id number]  DATE OF BIRTH:  March 25, 1951  DATE OF ADMISSION:  06/02/2015  PRIMARY CARE PHYSICIAN: Madelyn Brunner, MD   REQUESTING/REFERRING PHYSICIAN: Schavitz  CHIEF COMPLAINT:   Back pain and right knee pain HISTORY OF PRESENT ILLNESS:  Sharon Kirby  is a 64 y.o. female with a known history of diabetic mellitus, hypertension, anxiety and depression and multiple other medical problems is brought into the ED after she sustained a fall and having low back pain. Patient initially fell 5 days ago while trying to reach a teaspoon which fell on the floor. Her knee x-ray was fine without any fractures at that time. Patient was discharged home from the ED. Patient sustained another fall yesterday evening while trying to slide onto the bed, following which she started having low back pain. As patient's son was unable to take care of her at home she is brought into the ED for low back pain and worsening of right knee pain. Back x-rays did not show any acute findings. PT has evaluated the patient hasn't and has recommended skilled nursing facility. Patient denies any chest pain but troponin is elevated at 0.07. The blood work done in the ED has revealed his potassium at 6.1. EKG did not reveal any changes when compared to the old one. According to the lab the sample was hemolyzed, so repeat stat BMP and troponin is ordered at around 6:30 PM which is still pending at this point. Patient is resting comfortably with no chest pain or shortness of breath  PAST MEDICAL HISTORY:   Past Medical History  Diagnosis Date  . Seizures ([place])   . Stroke ([place])   . Brain tumor ([place])   . Diabetes mellitus without complication ([place])   . Arthritis   . Sleep apnea   . GERD (gastroesophageal reflux disease)   . Asthma   . Long QT interval   . Hypertension   . Panic attacks   . Anxiety   . Depression   .  Syncope     PAST SURGICAL HISTOIRY:   Past Surgical History  Procedure Laterality Date  . Csf shunt      x2 due to tumor  . Cholecystectomy    . Tonsillectomy    . Carpal tunnel release Left   . Trigger finger release Left   . Knee surgery Left   . Colonoscopy with propofol N/A 04/10/2015    Procedure: COLONOSCOPY WITH PROPOFOL;  Surgeon: Lucilla Lame, MD;  Location: [facility] ENDOSCOPY;  Service: Endoscopy;  Laterality: N/A;  . Esophagogastroduodenoscopy (egd) with propofol N/A 04/10/2015    Procedure: ESOPHAGOGASTRODUODENOSCOPY (EGD) WITH PROPOFOL;  Surgeon: Lucilla Lame, MD;  Location: [facility] ENDOSCOPY;  Service: Endoscopy;  Laterality: N/A;    SOCIAL HISTORY:   Social History  Substance Use Topics  . Smoking status: Never Smoker   . Smokeless tobacco: Never Used  . Alcohol Use: No    FAMILY HISTORY:  No family history on file.  DRUG ALLERGIES:   Allergies  Allergen Reactions  . Sulfa Antibiotics Anaphylaxis  . Penicillins Rash and Other (See Comments)    Has patient had a PCN reaction causing immediate rash, facial/tongue/throat swelling, SOB or lightheadedness with hypotension: No Has patient had a PCN reaction causing severe rash involving mucus membranes or skin necrosis: No Has patient had a PCN reaction that required hospitalization No Has patient had a PCN reaction occurring  within the last 10 years: Yes If all of the above answers are "NO", then may proceed with Cephalosporin use.    REVIEW OF SYSTEMS:  CONSTITUTIONAL: No fever, fatigue or weakness.  EYES: No blurred or double vision.  EARS, NOSE, AND THROAT: No tinnitus or ear pain.  RESPIRATORY: No cough, shortness of breath, wheezing or hemoptysis.  CARDIOVASCULAR: No chest pain, orthopnea, edema.  GASTROINTESTINAL: No nausea, vomiting, diarrhea or abdominal pain.  GENITOURINARY: No dysuria, hematuria.  ENDOCRINE: No polyuria, nocturia,  HEMATOLOGY: No anemia, easy bruising or bleeding SKIN: No rash or  lesion. MUSCULOSKELETAL: Reporting right knee joint pain and low back pain , has history of arthritis.   NEUROLOGIC: No tingling, numbness, weakness.  PSYCHIATRY: No anxiety or depression.   MEDICATIONS AT HOME:   Prior to Admission medications   Medication Sig Start Date End Date Taking? Authorizing Provider  aspirin EC 81 MG tablet Take 81 mg by mouth daily.   Yes Historical Provider, MD  Calcium Carb-Cholecalciferol (CALCIUM 600+D) 600-800 MG-UNIT TABS Take 1 tablet by mouth daily.   Yes Historical Provider, MD  cetirizine (ZYRTEC) 10 MG tablet Take 10 mg by mouth daily.   Yes Historical Provider, MD  Cholecalciferol (VITAMIN D) 2000 UNITS CAPS Take 2,000 Units by mouth daily.   Yes Historical Provider, MD  clonazePAM (KLONOPIN) 1 MG tablet Take 1 mg by mouth 2 (two) times daily.   Yes Historical Provider, MD  dimenhyDRINATE (DRAMAMINE) 50 MG tablet Take 50 mg by mouth every 8 (eight) hours as needed for nausea.   Yes Historical Provider, MD  docusate sodium (COLACE) 100 MG capsule Take 100 mg by mouth 2 (two) times daily as needed for mild constipation.   Yes Historical Provider, MD  ferrous sulfate 325 (65 FE) MG tablet Take 1 tablet (325 mg total) by mouth 2 (two) times daily with a meal. 04/13/15  Yes Srikar Sudini, MD  glipiZIDE (GLUCOTROL XL) 10 MG 24 hr tablet Take 10 mg by mouth daily.   Yes Historical Provider, MD  HYDROcodone-acetaminophen (NORCO/VICODIN) 5-325 MG tablet Take 1 tablet by mouth every 6 (six) hours as needed for moderate pain.   Yes Historical Provider, MD  hydrOXYzine (ATARAX/VISTARIL) 25 MG tablet Take 25 mg by mouth 3 (three) times daily as needed for itching.   Yes Historical Provider, MD  ibuprofen (ADVIL,MOTRIN) 800 MG tablet Take 800 mg by mouth every 8 (eight) hours as needed for mild pain.   Yes Historical Provider, MD  lisinopril (PRINIVIL,ZESTRIL) 10 MG tablet Take 10 mg by mouth daily.   Yes Historical Provider, MD  metFORMIN (GLUCOPHAGE) 1000 MG tablet  Take 1,000 mg by mouth 2 (two) times daily with a meal.   Yes Historical Provider, MD  methocarbamol (ROBAXIN) 500 MG tablet Take 500 mg by mouth every 6 (six) hours as needed for muscle spasms.   Yes Historical Provider, MD  Multiple Vitamin (MULTIVITAMIN WITH MINERALS) TABS tablet Take 1 tablet by mouth daily.   Yes Historical Provider, MD  omeprazole (PRILOSEC) 20 MG capsule Take 20 mg by mouth daily.   Yes Historical Provider, MD  PARoxetine (PAXIL) 40 MG tablet Take 40 mg by mouth daily.   Yes Historical Provider, MD  Potassium Gluconate 550 MG TABS Take 550 mg by mouth daily.   Yes Historical Provider, MD  Turmeric Curcumin 500 MG CAPS Take 500 mg by mouth daily.   Yes Historical Provider, MD  zinc gluconate 50 MG tablet Take 50 mg by mouth daily.  Yes Historical Provider, MD  oxyCODONE-acetaminophen (ROXICET) 5-325 MG tablet Take 1 tablet by mouth every 6 (six) hours as needed for severe pain. Patient not taking: Reported on 06/02/2015 05/31/15   Carrie Mew, MD  traMADol (ULTRAM) 50 MG tablet Take 1 tablet (50 mg total) by mouth every 8 (eight) hours as needed for moderate pain or severe pain. Patient not taking: Reported on 06/02/2015 04/13/15   Hillary Bow, MD      VITAL SIGNS:  Blood pressure 112/66, pulse 101, temperature 99.6 F (37.6 C), temperature source Rectal, resp. rate 14, height 5' (1.524 m), weight 137.44 kg (303 lb), SpO2 98 %.  PHYSICAL EXAMINATION:  GENERAL:  64 y.o.-year-old patient lying in the bed with no acute distress.  EYES: Pupils equal, round, reactive to light and accommodation. No scleral icterus. Extraocular muscles intact.  HEENT: Head atraumatic, normocephalic. Oropharynx and nasopharynx clear.  NECK:  Supple, no jugular venous distention. No thyroid enlargement, no tenderness.  LUNGS: Normal breath sounds bilaterally, no wheezing, rales,rhonchi or crepitation. No use of accessory muscles of respiration.  CARDIOVASCULAR: S1, S2 normal. No murmurs,  rubs, or gallops.  ABDOMEN: Soft, nontender, nondistended. Bowel sounds present. No organomegaly or mass. Morbidly obese EXTREMITIES: No pedal edema, cyanosis, or clubbing. Right knee pain is tender lower back is diffusely tender, no saddle anesthesia NEUROLOGIC: Cranial nerves II through XII are intact. Muscle strength 5/5 in all extremities. Sensation intact. Gait not checked.  PSYCHIATRIC: The patient is alert and oriented x 3.  SKIN: No obvious rash, lesion, or ulcer.   LABORATORY PANEL:   CBC  Recent Labs Lab 06/02/15 1532  WBC 12.7*  HGB 10.7*  HCT 32.5*  PLT 98*   ------------------------------------------------------------------------------------------------------------------  Chemistries   Recent Labs Lab 06/02/15 1532  NA 129*  K 6.1*  CL 99*  CO2 23  GLUCOSE 164*  BUN 24*  CREATININE 0.72  CALCIUM 8.7*   ------------------------------------------------------------------------------------------------------------------  Cardiac Enzymes  Recent Labs Lab 06/02/15 1532  TROPONINI 0.07*   ------------------------------------------------------------------------------------------------------------------  RADIOLOGY:  Dg Chest 2 View  06/02/2015  CLINICAL DATA:  Back pain, chronic right leg pain EXAM: CHEST  2 VIEW COMPARISON:  None. FINDINGS: Cardiomegaly is noted. There is elevation of the right hemidiaphragm. No acute infiltrate or pleural effusion. No pulmonary edema. Mild degenerative changes mid and lower thoracic spine. IMPRESSION: Cardiomegaly. No active disease. Elevation of the right hemidiaphragm. Electronically Signed   By: Lahoma Crocker M.D.   On: 06/02/2015 14:58   Dg Lumbar Spine Complete  06/02/2015  CLINICAL DATA:  Back pain, chronic right leg pain EXAM: LUMBAR SPINE - COMPLETE 4+ VIEW COMPARISON:  None. FINDINGS: Four views of lumbar spine submitted. There is moderate disc space flattening at L5-S1 level. About 8 mm anterolisthesis L5 on S1  vertebral body. Facet degenerative changes L4 and L5 level. Disc space flattening with mild anterior spurring and endplate sclerotic changes at T12-L1 level. No acute fracture. Moderate gas throughout the colon. IMPRESSION: No acute fracture. There is about 8 mm anterolisthesis L5 on S1 vertebral body. Disc space flattening at T12-L1 and L5-S1 level. Electronically Signed   By: Lahoma Crocker M.D.   On: 06/02/2015 15:00   Ct Angio Chest Pe W/cm &/or Wo Cm  06/02/2015  CLINICAL DATA:  Shortness of breath with elevated troponin and back pain. EXAM: CT ANGIOGRAPHY CHEST WITH CONTRAST TECHNIQUE: Multidetector CT imaging of the chest was performed using the standard protocol during bolus administration of intravenous contrast. Multiplanar CT image reconstructions and MIPs were  obtained to evaluate the vascular anatomy. CONTRAST:  32mL OMNIPAQUE IOHEXOL 350 MG/ML SOLN COMPARISON:  Chest radiograph 06/02/2015 FINDINGS: No evidence for a pulmonary embolism. No significant pericardial or pleural fluid. Subcarinal tissue measures up to 1.3 cm in the short axis. Otherwise, there is no significant chest lymphadenopathy. There is a small amount of perihepatic ascites and the liver has a nodular contour. In addition, the spleen is enlarged measuring 18.2 cm in the AP dimension. Gallbladder has been removed. Small lymph nodes versus varices in the gastrohepatic ligament region. Cannot exclude small esophageal varices. Limited evaluation of the portal venous system on this examination. The trachea and mainstem bronchi are patent. Volume loss in the right lower lobe. There is also volume loss in the left lower lobe. No significant airspace disease or consolidation in the lungs. Degenerative changes at the glenohumeral joints are incompletely visualized. No acute bone abnormality. Review of the MIP images confirms the above findings. IMPRESSION: No evidence for a pulmonary embolism. Volume loss in the lower lobes, right side greater  the left. Cirrhosis with portal hypertension. Portal hypertension demonstrated by perihepatic ascites, splenomegaly and possible varices. Electronically Signed   By: Markus Daft M.D.   On: 06/02/2015 17:22    EKG:   Orders placed or performed during the hospital encounter of 06/02/15  . ED EKG  . ED EKG  . EKG 12-Lead  . EKG 12-Lead  . ED EKG  . ED EKG  . EKG 12-Lead  . EKG 12-Lead    IMPRESSION AND PLAN:   1. Elevated troponin Monitor patient on telemetry Cycle cardiac biomarkers, we will do more investigations if troponins are trending. Repeat troponin stat is still pending Patient is asymptomatic from cardiac standpoint Provide aspirin We'll start patient on low-dose beta blocker, check fasting lipid panel in a.m.  2. Acute low back pain and right knee pain status post frequent falls X-ray with no acute fractures Pain management as needed PT is recommending skilled nursing care Consult care management regarding placement Urinalysis, urine culture and blood cultures were ordered in the ED, need to be followed up Continue home medication Robaxin for muscle relaxation  3. Hyperkalemia and hyponatremia, but lab is reporting that the sample is hemolyzed Repeat BMP, stat is ordered, which is pending at this time still. This pending labs will be followed up by the on-call physician and management will be provided based on the repeat lab results No acute EKG changes and compared with the previous one Provide IV fluids with normal saline at 75 cc/h , monitor a.m. labs  Discontinue potassium supplements which her home medications   4. Diabetes mellitus Provide sliding scale insulin Check hemoglobin A1c Continue patient's home medication glipizide and metformin D  5. Essential hypertension Continue home medication list no pedal and titrate as needed basis DVT prophylaxis with Lovenox subcutaneous   6. History of depression and anxiety Continue home medications  Klonopin     All the records are reviewed and case discussed with ED provider. Management plans discussed with the patient, family and they are in agreement.  CODE STATUS: Full code, son is the healthcare power of attorney   TOTAL TIME TAKING CARE OF THIS PATIENT: 45  minutes.    Nicholes Mango M.D on 06/02/2015 at 7:48 PM  Between 7am to 6pm - Pager - [phone]  After 6pm go to [email or web address] - password [email or web address] [facility]  Office  [phone]  CC: Primary care physician; Madelyn Brunner, MD

## 2015-06-03 ENCOUNTER — Inpatient Hospital Stay (HOSPITAL_COMMUNITY)
Admit: 2015-06-03 | Discharge: 2015-06-03 | Disposition: A | Discharge status: Home / Self Care | Payer: Commercial Managed Care - HMO | Attending: Infectious Diseases | Admitting: Infectious Diseases

## 2015-06-03 DIAGNOSIS — M545 Low back pain: Secondary | ICD-10-CM | POA: Diagnosis not present

## 2015-06-03 DIAGNOSIS — R509 Fever, unspecified: Secondary | ICD-10-CM

## 2015-06-03 LAB — CREATININE, SERUM: Creatinine, Ser: 0.65 mg/dL (ref 0.44–1.00)

## 2015-06-03 LAB — BASIC METABOLIC PANEL
ANION GAP: 7 (ref 5–15)
BUN: 22 mg/dL — ABNORMAL HIGH (ref 6–20)
CALCIUM: 8.7 mg/dL — AB (ref 8.9–10.3)
CHLORIDE: 100 mmol/L — AB (ref 101–111)
CO2: 23 mmol/L (ref 22–32)
Creatinine, Ser: 0.66 mg/dL (ref 0.44–1.00)
GFR calc Af Amer: >60 mL/min (ref >60)
GFR calc non Af Amer: >60 mL/min (ref >60)
GLUCOSE: 165 mg/dL — AB (ref 65–99)
POTASSIUM: 4.2 mmol/L (ref 3.5–5.1)
Sodium: 130 mmol/L — ABNORMAL LOW (ref 135–145)

## 2015-06-03 LAB — LIPID PANEL
Cholesterol: 197 mg/dL (ref 0–200)
HDL: 24 mg/dL — AB (ref >40)
LDL CALC: 136 mg/dL — AB (ref 0–99)
TRIGLYCERIDES: 186 mg/dL — AB (ref <150)
Total CHOL/HDL Ratio: 8.2 RATIO
VLDL: 37 mg/dL (ref 0–40)

## 2015-06-03 LAB — GLUCOSE, CAPILLARY
GLUCOSE-CAPILLARY: 107 mg/dL — AB (ref 65–99)
Glucose-Capillary: 188 mg/dL — ABNORMAL HIGH (ref 65–99)

## 2015-06-03 LAB — CBC
HEMATOCRIT: 31.4 % — AB (ref 35.0–47.0)
HEMATOCRIT: 32 % — AB (ref 35.0–47.0)
HEMOGLOBIN: 10.3 g/dL — AB (ref 12.0–16.0)
HEMOGLOBIN: 10.4 g/dL — AB (ref 12.0–16.0)
MCH: 28.8 pg (ref 26.0–34.0)
MCH: 29.5 pg (ref 26.0–34.0)
MCHC: 32.5 g/dL (ref 32.0–36.0)
MCHC: 32.8 g/dL (ref 32.0–36.0)
MCV: 88.7 fL (ref 80.0–100.0)
MCV: 89.8 fL (ref 80.0–100.0)
Platelets: 103 10*3/uL — ABNORMAL LOW (ref 150–440)
Platelets: 112 10*3/uL — ABNORMAL LOW (ref 150–440)
RBC: 3.5 MIL/uL — ABNORMAL LOW (ref 3.80–5.20)
RBC: 3.61 MIL/uL — ABNORMAL LOW (ref 3.80–5.20)
RDW: 27.1 % — ABNORMAL HIGH (ref 11.5–14.5)
RDW: 27.6 % — ABNORMAL HIGH (ref 11.5–14.5)
WBC: 10.6 10*3/uL (ref 3.6–11.0)
WBC: 12 10*3/uL — ABNORMAL HIGH (ref 3.6–11.0)

## 2015-06-03 LAB — HEMOGLOBIN A1C: HEMOGLOBIN A1C: 6 % (ref 4.0–6.0)

## 2015-06-03 LAB — TROPONIN I: Troponin I: <0.03 ng/mL (ref <0.031)

## 2015-06-03 MED ORDER — VANCOMYCIN HCL 10 G IV SOLR
1500.0000 mg | Freq: Two times a day (BID) | INTRAVENOUS | Status: DC
  Administered 2015-06-03 – 2015-06-04 (×5): 1500 mg via INTRAVENOUS
  Filled 2015-06-03 (×6): qty 1500

## 2015-06-03 MED ORDER — SENNOSIDES-DOCUSATE SODIUM 8.6-50 MG PO TABS: 1.0000 | ORAL_TABLET | Freq: Two times a day (BID) | ORAL | Status: DC

## 2015-06-03 MED ORDER — MORPHINE SULFATE (PF) 2 MG/ML IV SOLN
2.0000 mg | INTRAVENOUS | Status: DC | PRN
  Administered 2015-06-03 (×2): 2 mg via INTRAVENOUS
  Filled 2015-06-03: qty 1

## 2015-06-03 MED ORDER — VANCOMYCIN HCL IN DEXTROSE 1-5 GM/200ML-% IV SOLN
1000.0000 mg | INTRAVENOUS | Status: DC
  Filled 2015-06-03: qty 200

## 2015-06-03 MED ORDER — SENNOSIDES-DOCUSATE SODIUM 8.6-50 MG PO TABS
2.0000 | ORAL_TABLET | Freq: Two times a day (BID) | ORAL | Status: DC
  Administered 2015-06-03 – 2015-06-05 (×4): 2 via ORAL
  Filled 2015-06-03 (×5): qty 2

## 2015-06-03 MED ORDER — MORPHINE SULFATE (PF) 2 MG/ML IV SOLN
INTRAVENOUS | Status: AC
  Filled 2015-06-03: qty 1

## 2015-06-03 MED ORDER — HYDROMORPHONE HCL 1 MG/ML IJ SOLN
1.0000 mg | INTRAMUSCULAR | Status: DC | PRN
  Administered 2015-06-03 – 2015-06-04 (×6): 1 mg via INTRAVENOUS
  Filled 2015-06-03 (×6): qty 1

## 2015-06-03 MED ORDER — VANCOMYCIN HCL 10 G IV SOLR
1500.0000 mg | Freq: Once | INTRAVENOUS | Status: AC
  Administered 2015-06-03: 1500 mg via INTRAVENOUS
  Filled 2015-06-03: qty 1500

## 2015-06-03 MED ORDER — INSULIN ASPART 100 UNIT/ML ~~LOC~~ SOLN: 0.0000 [IU] | Freq: Three times a day (TID) | SUBCUTANEOUS | Status: DC

## 2015-06-03 MED ORDER — INSULIN ASPART 100 UNIT/ML ~~LOC~~ SOLN: 0.0000 [IU] | Freq: Every day | SUBCUTANEOUS | Status: DC

## 2015-06-03 NOTE — Progress Notes (Signed)
Notified Dr. Manuella Ghazi of blood culture results. Per MD, he will f/u. Will continue to monitor patient. Horton Finer

## 2015-06-03 NOTE — Progress Notes (Addendum)
[facility] NAME: Sharon Kirby    MR#:  [id number]  DATE OF BIRTH:  [birth year]  SUBJECTIVE:  CHIEF COMPLAINT:   Chief Complaint  Patient presents with  . Back Pain  having severe back and shoulder pain, crying in pain.  Blood cultures positive for gram-positive cocci REVIEW OF SYSTEMS:  Review of Systems  Constitutional: Negative for fever, weight loss, malaise/fatigue and diaphoresis.  HENT: Negative for ear discharge, ear pain, hearing loss, nosebleeds, sore throat and tinnitus.   Eyes: Negative for blurred vision and pain.  Respiratory: Negative for cough, hemoptysis, shortness of breath and wheezing.   Cardiovascular: Negative for chest pain, palpitations, orthopnea and leg swelling.  Gastrointestinal: Negative for heartburn, nausea, vomiting, abdominal pain, diarrhea, constipation and blood in stool.  Genitourinary: Negative for dysuria, urgency and frequency.  Musculoskeletal: Positive for myalgias, back pain, joint pain, falls and neck pain.  Skin: Negative for itching and rash.  Neurological: Negative for dizziness, tingling, tremors, focal weakness, seizures, weakness and headaches.  Psychiatric/Behavioral: Negative for depression. The patient is not nervous/anxious.    DRUG ALLERGIES:   Allergies  Allergen Reactions  . Sulfa Antibiotics Anaphylaxis  . Penicillins Rash and Other (See Comments)    Has patient had a PCN reaction causing immediate rash, facial/tongue/throat swelling, SOB or lightheadedness with hypotension: No Has patient had a PCN reaction causing severe rash involving mucus membranes or skin necrosis: No Has patient had a PCN reaction that required hospitalization No Has patient had a PCN reaction occurring within the last 10 years: Yes If all of the above answers are "NO", then may proceed with Cephalosporin use.   VITALS:  Blood pressure 96/56, pulse 93, temperature 98.4 F (36.9 C),  temperature source Oral, resp. rate 15, height 5' (1.524 m), weight 138.438 kg (305 lb 3.2 oz), SpO2 91 %. PHYSICAL EXAMINATION:  Physical Exam  Constitutional: She is oriented to person, place, and time and well-developed, well-nourished, and in no distress.  HENT:  Head: Normocephalic and atraumatic.  Eyes: Conjunctivae and EOM are normal. Pupils are equal, round, and reactive to light.  Neck: Normal range of motion. Neck supple. No tracheal deviation present. No thyromegaly present.  Cardiovascular: Normal rate, regular rhythm and normal heart sounds.   Pulmonary/Chest: Effort normal and breath sounds normal. No respiratory distress. She has no wheezes. She exhibits no tenderness.  Abdominal: Soft. Bowel sounds are normal. She exhibits no distension. There is no tenderness.  Musculoskeletal: Normal range of motion.       Lumbar back: She exhibits tenderness and bony tenderness.  Neurological: She is alert and oriented to person, place, and time. No cranial nerve deficit.  Skin: Skin is warm and dry. No rash noted.  Psychiatric: Affect normal. She exhibits a depressed mood.   LABORATORY PANEL:   CBC  Recent Labs Lab 06/03/15 0459  WBC 10.6  HGB 10.3*  HCT 31.4*  PLT 103*   ------------------------------------------------------------------------------------------------------------------ Chemistries   Recent Labs Lab 06/03/15 0459  NA 130*  K 4.2  CL 100*  CO2 23  GLUCOSE 165*  BUN 22*  CREATININE 0.66  CALCIUM 8.7*   RADIOLOGY:  Ct Angio Chest Pe W/cm &/or Wo Cm  06/02/2015  CLINICAL DATA:  Shortness of breath with elevated troponin and back pain. EXAM: CT ANGIOGRAPHY CHEST WITH CONTRAST TECHNIQUE: Multidetector CT imaging of the chest was performed using the standard protocol during bolus administration of intravenous contrast. Multiplanar CT image reconstructions and MIPs  were obtained to evaluate the vascular anatomy. CONTRAST:  32mL OMNIPAQUE IOHEXOL 350 MG/ML SOLN  COMPARISON:  Chest radiograph 06/02/2015 FINDINGS: No evidence for a pulmonary embolism. No significant pericardial or pleural fluid. Subcarinal tissue measures up to 1.3 cm in the short axis. Otherwise, there is no significant chest lymphadenopathy. There is a small amount of perihepatic ascites and the liver has a nodular contour. In addition, the spleen is enlarged measuring 18.2 cm in the AP dimension. Gallbladder has been removed. Small lymph nodes versus varices in the gastrohepatic ligament region. Cannot exclude small esophageal varices. Limited evaluation of the portal venous system on this examination. The trachea and mainstem bronchi are patent. Volume loss in the right lower lobe. There is also volume loss in the left lower lobe. No significant airspace disease or consolidation in the lungs. Degenerative changes at the glenohumeral joints are incompletely visualized. No acute bone abnormality. Review of the MIP images confirms the above findings. IMPRESSION: No evidence for a pulmonary embolism. Volume loss in the lower lobes, right side greater the left. Cirrhosis with portal hypertension. Portal hypertension demonstrated by perihepatic ascites, splenomegaly and possible varices. Electronically Signed   By: Markus Daft M.D.   On: 06/02/2015 17:22   ASSESSMENT AND PLAN:  64 y.o. female with a known history of diabetic mellitus, hypertension, anxiety and depression and multiple other medical problems is brought into the ED after she sustained a fall and having low back pain. Patient initially fell 5 days ago while trying to reach a teaspoon which fell on the floor.  * Sepsis: Present on admission. Blood cultures positive for gram-positive cocci, 4 out of 4 bottles.  Continue vancomycin. will need 2 weeks IV abx  * Cirrhosis of Liver: checking Hep serologies, could be fatty liver.  * Elevated troponin: Likely due to supply demand ischemia  * Acute on chronic low back pain and right knee pain  status post frequent falls X-ray with no acute fractures but 8 mm anterolisthesis L5 on S1 vertebral body Pain management as needed PT is recommending skilled nursing care Consult care management regarding placement We will consult orthopedics  Continue home medication Robaxin for muscle relaxation  * Hyperkalemia and hyponatremia: Improving  * Diabetes mellitus Continue sliding scale insulin hemoglobin A1c 6.0  Continue patient's home medication glipizide and metformin  * Essential hypertension Continue home medication list no pedal and titrate as needed basis DVT prophylaxis with Lovenox subcutaneous  * History of depression and anxiety Continue home medications Klonopin     All the records are reviewed and case discussed with Care Management/Social Worker. Management plans discussed with the patient, family and they are in agreement.  CODE STATUS: Full Code  TOTAL TIME TAKING CARE OF THIS PATIENT: 35 minutes.   More than 50% of the time was spent in counseling/coordination of care: YES  POSSIBLE D/C IN 2-3 DAYS, DEPENDING ON CLINICAL CONDITION.   [facility], Kelan Pritt M.D on 06/03/2015 at 4:52 PM  Between 7am to 6pm - Pager - [phone]  After 6pm go to [email or web address] - password [email or web address] [facility]  Office  [phone]  CC: Primary care physician; Madelyn Brunner, MD

## 2015-06-03 NOTE — Consult Note (Signed)
[facility] Infectious Disease     Reason for Consult:GPC bacteremia    Referring Physician: Max Sane Date of Admission:  06/02/2015   Active Problems:   Hyponatremia   HPI: Sharon Kirby is a 64 y.o. female with DM, Htn, anxiety, depression admitted with falls and Low bacl pain. Also had + troponins and had wbc 12.7 but was afebrile. Since admit spiked temp to 102.3 and bcx done are + GPC.  She has been started on vanco 11/16.  CT chest shows no PE but some vol loss and cirrhosis.  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  She also has hx of known history of obstructive sleep apnea noncompliant to CPAP, chronic left shoulder pain, GERD, morbid , history of seizures history of brain tumor, anxiety depression panic attacks  Per her son today her back pain began after her fall about 2 weeks ago. She has had lots of skin breakdown between her legs but no obvious boils.  Past Medical History  Diagnosis Date  . Seizures ([place])   . Stroke ([place])   . Brain tumor ([place])   . Diabetes mellitus without complication ([place])   . Arthritis   . Sleep apnea   . GERD (gastroesophageal reflux disease)   . Asthma   . Long QT interval   . Hypertension   . Panic attacks   . Anxiety   . Depression   . Syncope    Past Surgical History  Procedure Laterality Date  . Csf shunt      x2 due to tumor  . Cholecystectomy    . Tonsillectomy    . Carpal tunnel release Left   . Trigger finger release Left   . Knee surgery Left   . Colonoscopy with propofol N/A 04/10/2015    Procedure: COLONOSCOPY WITH PROPOFOL;  Surgeon: Lucilla Lame, MD;  Location: [facility] ENDOSCOPY;  Service: Endoscopy;  Laterality: N/A;  . Esophagogastroduodenoscopy (egd) with propofol N/A 04/10/2015    Procedure: ESOPHAGOGASTRODUODENOSCOPY (EGD) WITH PROPOFOL;  Surgeon: Lucilla Lame, MD;  Location: [facility] ENDOSCOPY;  Service: Endoscopy;  Laterality: N/A;   Social History  Substance  Use Topics  . Smoking status: Never Smoker   . Smokeless tobacco: Never Used  . Alcohol Use: No   History reviewed. No pertinent family history.  Allergies:  Allergies  Allergen Reactions  . Sulfa Antibiotics Anaphylaxis  . Penicillins Rash and Other (See Comments)    Has patient had a PCN reaction causing immediate rash, facial/tongue/throat swelling, SOB or lightheadedness with hypotension: No Has patient had a PCN reaction causing severe rash involving mucus membranes or skin necrosis: No Has patient had a PCN reaction that required hospitalization No Has patient had a PCN reaction occurring within the last 10 years: Yes If all of the above answers are "NO", then may proceed with Cephalosporin use.    Current antibiotics: Antibiotics Given (last 72 hours)    Date/Time Action Medication Dose Rate   06/03/15 1426 Given   vancomycin (VANCOCIN) 1,500 mg in sodium chloride 0.9 % 500 mL IVPB 1,500 mg 250 mL/hr   06/03/15 1427 Given   vancomycin (VANCOCIN) 1,500 mg in sodium chloride 0.9 % 500 mL IVPB 1,500 mg 250 mL/hr   06/03/15 1432 Given   vancomycin (VANCOCIN) 1,500 mg in sodium chloride 0.9 % 500 mL IVPB 1,500 mg 250 mL/hr      MEDICATIONS: . aspirin EC  81 mg Oral Daily  . calcium-vitamin D  1 tablet Oral Daily  . cholecalciferol  2,000 Units Oral Daily  . clonazePAM  1 mg Oral BID  . docusate sodium  100 mg Oral BID  . enoxaparin (LOVENOX) injection  40 mg Subcutaneous BID  . ferrous sulfate  325 mg Oral BID WC  . glipiZIDE  10 mg Oral Daily  . lisinopril  10 mg Oral Daily  . loratadine  10 mg Oral Daily  . metFORMIN  1,000 mg Oral BID WC  . metoprolol tartrate  12.5 mg Oral BID  . multivitamin with minerals  1 tablet Oral Daily  . pantoprazole  40 mg Oral Daily  . PARoxetine  40 mg Oral Daily  . pravastatin  10 mg Oral q1800  . sodium chloride  3 mL Intravenous Q12H  . vancomycin  1,500 mg Intravenous Once  . vancomycin  1,500 mg Intravenous Q12H  . zinc  sulfate  220 mg Oral Daily    Review of Systems - 11 systems reviewed and negative per HPI   OBJECTIVE: Temp:  [98.4 F (36.9 C)-102.3 F (39.1 C)] 98.4 F (36.9 C) (11/16 1104) Pulse Rate:  [93-113] 93 (11/16 0922) Resp:  [14-23] 15 (11/16 1104) BP: (96-149)/(45-89) 96/56 mmHg (11/16 1104) SpO2:  [91 %-98 %] 91 % (11/16 0922) Weight:  [138.438 kg (305 lb 3.2 oz)] 138.438 kg (305 lb 3.2 oz) (11/15 2120) Physical Exam  Constitutional: morbidly obese, lying in bed, sleepy but arousable HENT: [place]/AT, PERRLA, no scleral icterus Mouth/Throat: Oropharynx is clear and dry . No oropharyngeal exudate.  Cardiovascular: Normal rate, regular rhythm and normal heart sounds.  Pulmonary/Chest: Effort normal and breath sounds normal. No respiratory distress.  has no wheezes.  Neck  supple, no nuchal rigidity Abdominal: obese Soft. Bowel sounds are normal.  exhibits no distension. There is no tenderness.  Lymphadenopathy: no cervical adenopathy. No axillary adenopathy Neurological: alert and interactive Skin: brusiing on arms, legs Has skin breakdown in groin and upper thighs Psychiatric: tearful. Odd affect  LABS: Results for orders placed or performed during the hospital encounter of 06/02/15 (from the past 48 hour(s))  CBC with Differential     Status: Abnormal   Collection Time: 06/02/15  3:32 PM  Result Value Ref Range   WBC 12.7 (H) 3.6 - 11.0 K/uL   RBC 3.67 (L) 3.80 - 5.20 MIL/uL   Hemoglobin 10.7 (L) 12.0 - 16.0 g/dL   HCT 32.5 (L) 35.0 - 47.0 %   MCV 88.5 80.0 - 100.0 fL   MCH 29.3 26.0 - 34.0 pg   MCHC 33.1 32.0 - 36.0 g/dL   RDW 27.7 (H) 11.5 - 14.5 %   Platelets 98 (L) 150 - 440 K/uL   Neutrophils Relative % 85 %   Lymphocytes Relative 5 %   Monocytes Relative 6 %   Eosinophils Relative 0 %   Basophils Relative 0 %   Band Neutrophils 4 %   Metamyelocytes Relative 0 %   Myelocytes 0 %   Promyelocytes Absolute 0 %   Blasts 0 %   nRBC 0 0 /100 WBC   Other 0 %   Neutro  Abs 11.3 (H) 1.4 - 6.5 K/uL   Lymphs Abs 0.6 (L) 1.0 - 3.6 K/uL   Monocytes Absolute 0.8 0.2 - 0.9 K/uL   Eosinophils Absolute 0.0 0 - 0.7 K/uL   Basophils Absolute 0.0 0 - 0.1 K/uL   RBC Morphology TARGET CELLS     Comment: MIXED RBC POPULATION HYPOCHROMIA   Basic metabolic panel  Status: Abnormal   Collection Time: 06/02/15  3:32 PM  Result Value Ref Range   Sodium 129 (L) 135 - 145 mmol/L   Potassium 6.1 (H) 3.5 - 5.1 mmol/L    Comment: HEMOLYSIS AT THIS LEVEL MAY AFFECT RESULT   Chloride 99 (L) 101 - 111 mmol/L   CO2 23 22 - 32 mmol/L   Glucose, Bld 164 (H) 65 - 99 mg/dL   BUN 24 (H) 6 - 20 mg/dL   Creatinine, Ser 0.72 0.44 - 1.00 mg/dL   Calcium 8.7 (L) 8.9 - 10.3 mg/dL   GFR calc non Af Amer >60 >60 mL/min   GFR calc Af Amer >60 >60 mL/min    Comment: (NOTE) The eGFR has been calculated using the CKD EPI equation. This calculation has not been validated in all clinical situations. eGFR's persistently <60 mL/min signify possible Chronic Kidney Disease.    Anion gap 7 5 - 15  Troponin I     Status: Abnormal   Collection Time: 06/02/15  3:32 PM  Result Value Ref Range   Troponin I 0.07 (H) <0.031 ng/mL    Comment: READ BACK AND VERIFIED WITH STEVE SNYDER AT 5784 06/02/15 MLZ        PERSISTENTLY INCREASED TROPONIN VALUES IN THE RANGE OF 0.04-0.49 ng/mL CAN BE SEEN IN:       -UNSTABLE ANGINA       -CONGESTIVE HEART FAILURE       -MYOCARDITIS       -CHEST TRAUMA       -ARRYHTHMIAS       -LATE PRESENTING MYOCARDIAL INFARCTION       -COPD   CLINICAL FOLLOW-UP RECOMMENDED.   Urinalysis complete, with microscopic ([facility] only)     Status: Abnormal   Collection Time: 06/02/15  6:38 PM  Result Value Ref Range   Color, Urine AMBER (A) YELLOW   APPearance CLEAR (A) CLEAR   Glucose, UA NEGATIVE NEGATIVE mg/dL   Bilirubin Urine NEGATIVE NEGATIVE   Ketones, ur NEGATIVE NEGATIVE mg/dL   Specific Gravity, Urine >1.060 (H) 1.005 - 1.030   Hgb urine dipstick 3+ (A) NEGATIVE    pH 5.0 5.0 - 8.0   Protein, ur 30 (A) NEGATIVE mg/dL   Nitrite NEGATIVE NEGATIVE   Leukocytes, UA NEGATIVE NEGATIVE   RBC / HPF TOO NUMEROUS TO COUNT 0 - 5 RBC/hpf   WBC, UA 0-5 0 - 5 WBC/hpf   Bacteria, UA NONE SEEN NONE SEEN   Squamous Epithelial / LPF 0-5 (A) NONE SEEN   Mucous PRESENT   Urine culture     Status: None (Preliminary result)   Collection Time: 06/02/15  6:38 PM  Result Value Ref Range   Specimen Description URINE, RANDOM    Special Requests NONE    Culture NO GROWTH < 12 HOURS    Report Status PENDING   Blood culture (routine x 2)     Status: None (Preliminary result)   Collection Time: 06/02/15  6:39 PM  Result Value Ref Range   Specimen Description BLOOD RIGHT ARM    Special Requests BOTTLES DRAWN AEROBIC AND ANAEROBIC 4CC    Culture  Setup Time      GRAM POSITIVE COCCI IN CLUSTERS IN BOTH AEROBIC AND ANAEROBIC BOTTLES CRITICAL RESULT CALLED TO, READ BACK BY AND VERIFIED WITH: DONEESHA ROBERTSON [id number] ON 06/03/15 CTJ    Culture      GRAM POSITIVE COCCI IN CLUSTERS IN BOTH AEROBIC AND ANAEROBIC BOTTLES IDENTIFICATION TO FOLLOW    Report  Status PENDING   Blood culture (routine x 2)     Status: None (Preliminary result)   Collection Time: 06/02/15  6:39 PM  Result Value Ref Range   Specimen Description BLOOD RIGHT ASSIST CONTROL    Special Requests BOTTLES DRAWN AEROBIC AND ANAEROBIC 4CC    Culture  Setup Time      GRAM POSITIVE COCCI IN CLUSTERS IN BOTH AEROBIC AND ANAEROBIC BOTTLES CRITICAL VALUE NOTED.  VALUE IS CONSISTENT WITH PREVIOUSLY REPORTED AND CALLED VALUE.    Culture      GRAM POSITIVE COCCI IN CLUSTERS IN BOTH AEROBIC AND ANAEROBIC BOTTLES IDENTIFICATION TO FOLLOW    Report Status PENDING   Troponin I (q 6hr x 3)     Status: None   Collection Time: 06/02/15  7:00 PM  Result Value Ref Range   Troponin I <0.03 <0.031 ng/mL    Comment:        NO INDICATION OF MYOCARDIAL INJURY.   Basic metabolic panel     Status: Abnormal    Collection Time: 06/02/15  7:00 PM  Result Value Ref Range   Sodium 130 (L) 135 - 145 mmol/L   Potassium 4.2 3.5 - 5.1 mmol/L   Chloride 100 (L) 101 - 111 mmol/L   CO2 24 22 - 32 mmol/L   Glucose, Bld 201 (H) 65 - 99 mg/dL   BUN 24 (H) 6 - 20 mg/dL   Creatinine, Ser 0.72 0.44 - 1.00 mg/dL   Calcium 8.7 (L) 8.9 - 10.3 mg/dL   GFR calc non Af Amer >60 >60 mL/min   GFR calc Af Amer >60 >60 mL/min    Comment: (NOTE) The eGFR has been calculated using the CKD EPI equation. This calculation has not been validated in all clinical situations. eGFR's persistently <60 mL/min signify possible Chronic Kidney Disease.    Anion gap 6 5 - 15  Troponin I (q 6hr x 3)     Status: None   Collection Time: 06/03/15 12:38 AM  Result Value Ref Range   Troponin I <0.03 <0.031 ng/mL    Comment:        NO INDICATION OF MYOCARDIAL INJURY.   CBC     Status: Abnormal   Collection Time: 06/03/15 12:38 AM  Result Value Ref Range   WBC 12.0 (H) 3.6 - 11.0 K/uL   RBC 3.61 (L) 3.80 - 5.20 MIL/uL   Hemoglobin 10.4 (L) 12.0 - 16.0 g/dL   HCT 32.0 (L) 35.0 - 47.0 %   MCV 88.7 80.0 - 100.0 fL   MCH 28.8 26.0 - 34.0 pg   MCHC 32.5 32.0 - 36.0 g/dL   RDW 27.6 (H) 11.5 - 14.5 %   Platelets 112 (L) 150 - 440 K/uL  Creatinine, serum     Status: None   Collection Time: 06/03/15 12:38 AM  Result Value Ref Range   Creatinine, Ser 0.65 0.44 - 1.00 mg/dL   GFR calc non Af Amer >60 >60 mL/min   GFR calc Af Amer >60 >60 mL/min    Comment: (NOTE) The eGFR has been calculated using the CKD EPI equation. This calculation has not been validated in all clinical situations. eGFR's persistently <60 mL/min signify possible Chronic Kidney Disease.   Lipid panel     Status: Abnormal   Collection Time: 06/03/15  4:59 AM  Result Value Ref Range   Cholesterol 197 0 - 200 mg/dL   Triglycerides 186 (H) <150 mg/dL   HDL 24 (L) >40 mg/dL   Total  CHOL/HDL Ratio 8.2 RATIO   VLDL 37 0 - 40 mg/dL   LDL Cholesterol 136 (H) 0 -  99 mg/dL    Comment:        Total Cholesterol/HDL:CHD Risk Coronary Heart Disease Risk Table                     Men   Women  1/2 Average Risk   3.4   3.3  Average Risk       5.0   4.4  2 X Average Risk   9.6   7.1  3 X Average Risk  23.4   11.0        Use the calculated Patient Ratio above and the CHD Risk Table to determine the patient's CHD Risk.        ATP III CLASSIFICATION (LDL):  <100     mg/dL   Optimal  100-129  mg/dL   Near or Above                    Optimal  130-159  mg/dL   Borderline  160-189  mg/dL   High  >190     mg/dL   Very High   Troponin I     Status: None   Collection Time: 06/03/15  4:59 AM  Result Value Ref Range   Troponin I <0.03 <0.031 ng/mL    Comment:        NO INDICATION OF MYOCARDIAL INJURY.   Basic metabolic panel     Status: Abnormal   Collection Time: 06/03/15  4:59 AM  Result Value Ref Range   Sodium 130 (L) 135 - 145 mmol/L   Potassium 4.2 3.5 - 5.1 mmol/L   Chloride 100 (L) 101 - 111 mmol/L   CO2 23 22 - 32 mmol/L   Glucose, Bld 165 (H) 65 - 99 mg/dL   BUN 22 (H) 6 - 20 mg/dL   Creatinine, Ser 0.66 0.44 - 1.00 mg/dL   Calcium 8.7 (L) 8.9 - 10.3 mg/dL   GFR calc non Af Amer >60 >60 mL/min   GFR calc Af Amer >60 >60 mL/min    Comment: (NOTE) The eGFR has been calculated using the CKD EPI equation. This calculation has not been validated in all clinical situations. eGFR's persistently <60 mL/min signify possible Chronic Kidney Disease.    Anion gap 7 5 - 15  CBC     Status: Abnormal   Collection Time: 06/03/15  4:59 AM  Result Value Ref Range   WBC 10.6 3.6 - 11.0 K/uL   RBC 3.50 (L) 3.80 - 5.20 MIL/uL   Hemoglobin 10.3 (L) 12.0 - 16.0 g/dL   HCT 31.4 (L) 35.0 - 47.0 %   MCV 89.8 80.0 - 100.0 fL   MCH 29.5 26.0 - 34.0 pg   MCHC 32.8 32.0 - 36.0 g/dL   RDW 27.1 (H) 11.5 - 14.5 %   Platelets 103 (L) 150 - 440 K/uL  Glucose, capillary     Status: Abnormal   Collection Time: 06/03/15  7:33 AM  Result Value Ref Range    Glucose-Capillary 188 (H) 65 - 99 mg/dL   Comment 1 Notify RN    Comment 2 Document in Chart    No components found for: ESR, C REACTIVE PROTEIN MICRO: Recent Results (from the past 720 hour(s))  Urine culture     Status: None (Preliminary result)   Collection Time: 06/02/15  6:38 PM  Result Value Ref Range Status  Specimen Description URINE, RANDOM  Final   Special Requests NONE  Final   Culture NO GROWTH < 12 HOURS  Final   Report Status PENDING  Incomplete  Blood culture (routine x 2)     Status: None (Preliminary result)   Collection Time: 06/02/15  6:39 PM  Result Value Ref Range Status   Specimen Description BLOOD RIGHT ARM  Final   Special Requests BOTTLES DRAWN AEROBIC AND ANAEROBIC 4CC  Final   Culture  Setup Time   Final    GRAM POSITIVE COCCI IN CLUSTERS IN BOTH AEROBIC AND ANAEROBIC BOTTLES CRITICAL RESULT CALLED TO, READ BACK BY AND VERIFIED WITH: DONEESHA ROBERTSON [id number] ON 06/03/15 CTJ    Culture   Final    GRAM POSITIVE COCCI IN CLUSTERS IN BOTH AEROBIC AND ANAEROBIC BOTTLES IDENTIFICATION TO FOLLOW    Report Status PENDING  Incomplete  Blood culture (routine x 2)     Status: None (Preliminary result)   Collection Time: 06/02/15  6:39 PM  Result Value Ref Range Status   Specimen Description BLOOD RIGHT ASSIST CONTROL  Final   Special Requests BOTTLES DRAWN AEROBIC AND ANAEROBIC 4CC  Final   Culture  Setup Time   Final    GRAM POSITIVE COCCI IN CLUSTERS IN BOTH AEROBIC AND ANAEROBIC BOTTLES CRITICAL VALUE NOTED.  VALUE IS CONSISTENT WITH PREVIOUSLY REPORTED AND CALLED VALUE.    Culture   Final    GRAM POSITIVE COCCI IN CLUSTERS IN BOTH AEROBIC AND ANAEROBIC BOTTLES IDENTIFICATION TO FOLLOW    Report Status PENDING  Incomplete    IMAGING: Dg Chest 2 View  06/02/2015  CLINICAL DATA:  Back pain, chronic right leg pain EXAM: CHEST  2 VIEW COMPARISON:  None. FINDINGS: Cardiomegaly is noted. There is elevation of the right hemidiaphragm. No acute infiltrate  or pleural effusion. No pulmonary edema. Mild degenerative changes mid and lower thoracic spine. IMPRESSION: Cardiomegaly. No active disease. Elevation of the right hemidiaphragm. Electronically Signed   By: Lahoma Crocker M.D.   On: 06/02/2015 14:58   Dg Lumbar Spine Complete  06/02/2015  CLINICAL DATA:  Back pain, chronic right leg pain EXAM: LUMBAR SPINE - COMPLETE 4+ VIEW COMPARISON:  None. FINDINGS: Four views of lumbar spine submitted. There is moderate disc space flattening at L5-S1 level. About 8 mm anterolisthesis L5 on S1 vertebral body. Facet degenerative changes L4 and L5 level. Disc space flattening with mild anterior spurring and endplate sclerotic changes at T12-L1 level. No acute fracture. Moderate gas throughout the colon. IMPRESSION: No acute fracture. There is about 8 mm anterolisthesis L5 on S1 vertebral body. Disc space flattening at T12-L1 and L5-S1 level. Electronically Signed   By: Lahoma Crocker M.D.   On: 06/02/2015 15:00   Ct Head Wo Contrast  05/11/2015  CLINICAL DATA:  64 year old female with previous shunt placement and new daily persistent headaches. Initial encounter. EXAM: CT HEAD WITHOUT CONTRAST TECHNIQUE: Contiguous axial images were obtained from the base of the skull through the vertex without intravenous contrast. COMPARISON:  Noncontrast head CT 04/14/2012. FINDINGS: Bilateral posterior ventriculostomy catheters track through the lateral ventricles and appears stable in configuration since 2013. In the occipital scalp soft tissues in the midline these appear connected to a right cisterna magna catheter which courses into the dorsal left CSF space at the level of the cervicomedullary junction. The tip of this catheter is not included, but visualized portions appear stable since 2013. Lateral ventricle size and configuration is stable. The temporal horns are decompressed as  before. The third and fourth ventricles are decompressed as before. The cerebral aqueduct appears stable.  No intracranial mass or mass effect identified. Dystrophic basal ganglia and right cerebellar nucleus calcifications greater on the left are stable. No acute intracranial hemorrhage identified. No cortically based acute infarct identified. Calcified atherosclerosis at the skull base. Gray-white matter differentiation is stable and within normal limits. Stable visualized osseous structures. Visualized paranasal sinuses and mastoids are clear. Stable visualized orbit and scalp soft tissues. IMPRESSION: Stable CT appearance of the brain since 2013. No acute intracranial abnormality. Electronically Signed   By: Genevie Ann M.D.   On: 05/11/2015 15:55   Ct Angio Chest Pe W/cm &/or Wo Cm  06/02/2015  CLINICAL DATA:  Shortness of breath with elevated troponin and back pain. EXAM: CT ANGIOGRAPHY CHEST WITH CONTRAST TECHNIQUE: Multidetector CT imaging of the chest was performed using the standard protocol during bolus administration of intravenous contrast. Multiplanar CT image reconstructions and MIPs were obtained to evaluate the vascular anatomy. CONTRAST:  61m OMNIPAQUE IOHEXOL 350 MG/ML SOLN COMPARISON:  Chest radiograph 06/02/2015 FINDINGS: No evidence for a pulmonary embolism. No significant pericardial or pleural fluid. Subcarinal tissue measures up to 1.3 cm in the short axis. Otherwise, there is no significant chest lymphadenopathy. There is a small amount of perihepatic ascites and the liver has a nodular contour. In addition, the spleen is enlarged measuring 18.2 cm in the AP dimension. Gallbladder has been removed. Small lymph nodes versus varices in the gastrohepatic ligament region. Cannot exclude small esophageal varices. Limited evaluation of the portal venous system on this examination. The trachea and mainstem bronchi are patent. Volume loss in the right lower lobe. There is also volume loss in the left lower lobe. No significant airspace disease or consolidation in the lungs. Degenerative changes at the  glenohumeral joints are incompletely visualized. No acute bone abnormality. Review of the MIP images confirms the above findings. IMPRESSION: No evidence for a pulmonary embolism. Volume loss in the lower lobes, right side greater the left. Cirrhosis with portal hypertension. Portal hypertension demonstrated by perihepatic ascites, splenomegaly and possible varices. Electronically Signed   By: AMarkus DaftM.D.   On: 06/02/2015 17:22   Dg Knee Complete 4 Views Right  05/31/2015  CLINICAL DATA:  Fall onto right knee 3 days ago with severe pain with weight-bearing. Initial encounter. EXAM: RIGHT KNEE - COMPLETE 4+ VIEW COMPARISON:  None. FINDINGS: A total knee arthroplasty is well seated. No periprosthetic fracture or malalignment. No evidence of joint effusion. Osteopenic appearance of the bones. IMPRESSION: 1. No acute finding. 2. Unremarkable total knee arthroplasty. Electronically Signed   By: JMonte FantasiaM.D.   On: 05/31/2015 04:21    Assessment:   Seniyah A SMorris a 64y.o. female with morbid obesity admitted with back pain and falling with fever after admission and bcx + GPC.  I suspect this is staph infection from her skin breakdown in groin.  SHe seems to have cirrhosis with ascites and splenomegaly on Ct scan chest - son said he did not hear of this nor was she ever a drinker. Her mother did die of liver failure though.   Recommendations Bacteremia- check repeat bcx, check echo Further recs based on final ID but if Staph aureus will need 2 weeks IV abx  ? Cirrhosis- check hep serologies but I suspect fatty liver Will need otpt GI fu  Thank you very much for allowing me to participate in the care of this patient. Please call with  questions.   Cheral Marker. Ola Spurr, MD

## 2015-06-03 NOTE — Clinical Social Work Note (Signed)
CSW spoke with patient's son this morning and extended other bed offers. Patient's son wishes to remain with the choice of [place]. [facility] aware. Shela Leff MSW,LCSW

## 2015-06-03 NOTE — Progress Notes (Signed)
Patient still reports pain 10/10 one hour after Percocet given. Pain worsens with movement. Paged and spoke with Dr. Jannifer Franklin and MD is to put in orders for morphine IV PRN. Nursing staff will continue to monitor. Earleen Reaper, RN

## 2015-06-03 NOTE — Clinical Social Work Note (Signed)
CSW has received call from [place] at [place] with approved auth for patient to transfer to [facility] when time to [facility]. AuthFB:[id number]. Shela Leff MSW,LCSW [phone]

## 2015-06-03 NOTE — Progress Notes (Signed)
ANTIBIOTIC CONSULT NOTE - INITIAL  Pharmacy Consult for vancomycin dosing Indication: bacteremia  Allergies  Allergen Reactions  . Sulfa Antibiotics Anaphylaxis  . Penicillins Rash and Other (See Comments)    Has patient had a PCN reaction causing immediate rash, facial/tongue/throat swelling, SOB or lightheadedness with hypotension: No Has patient had a PCN reaction causing severe rash involving mucus membranes or skin necrosis: No Has patient had a PCN reaction that required hospitalization No Has patient had a PCN reaction occurring within the last 10 years: Yes If all of the above answers are "NO", then may proceed with Cephalosporin use.    Patient Measurements: Height: 5' (152.4 cm) Weight: (!) 305 lb 3.2 oz (138.438 kg) IBW/kg (Calculated) : 45.5 Adjusted Body Weight: 87kg  Vital Signs: Temp: 98.4 F (36.9 C) (11/16 1104) Temp Source: Oral (11/16 1104) BP: 96/56 mmHg (11/16 1104) Pulse Rate: 93 (11/16 0922) Intake/Output from previous day: 11/15 0701 - 11/16 0700 In: -  Out: 250 [Urine:250] Intake/Output from this shift:    Labs:  Recent Labs  06/02/15 1532 06/02/15 1900 06/03/15 0038 06/03/15 0459  WBC 12.7*  --  12.0* 10.6  HGB 10.7*  --  10.4* 10.3*  PLT 98*  --  112* 103*  CREATININE 0.72 0.72 0.65 0.66   Estimated Creatinine Clearance: 92.8 mL/min (by C-G formula based on Cr of 0.66). No results for input(s): VANCOTROUGH, VANCOPEAK, VANCORANDOM, GENTTROUGH, GENTPEAK, GENTRANDOM, TOBRATROUGH, TOBRAPEAK, TOBRARND, AMIKACINPEAK, AMIKACINTROU, AMIKACIN in the last 72 hours.   Microbiology: Recent Results (from the past 720 hour(s))  Urine culture     Status: None (Preliminary result)   Collection Time: 06/02/15  6:38 PM  Result Value Ref Range Status   Specimen Description URINE, RANDOM  Final   Special Requests NONE  Final   Culture NO GROWTH < 12 HOURS  Final   Report Status PENDING  Incomplete  Blood culture (routine x 2)     Status: None  (Preliminary result)   Collection Time: 06/02/15  6:39 PM  Result Value Ref Range Status   Specimen Description BLOOD RIGHT ARM  Final   Special Requests BOTTLES DRAWN AEROBIC AND ANAEROBIC 4CC  Final   Culture  Setup Time   Final    GRAM POSITIVE COCCI IN CLUSTERS IN BOTH AEROBIC AND ANAEROBIC BOTTLES CRITICAL RESULT CALLED TO, READ BACK BY AND VERIFIED WITH: DONEESHA ROBERTSON [id number] ON 06/03/15 CTJ    Culture   Final    GRAM POSITIVE COCCI IN CLUSTERS IN BOTH AEROBIC AND ANAEROBIC BOTTLES IDENTIFICATION TO FOLLOW    Report Status PENDING  Incomplete  Blood culture (routine x 2)     Status: None (Preliminary result)   Collection Time: 06/02/15  6:39 PM  Result Value Ref Range Status   Specimen Description BLOOD RIGHT ASSIST CONTROL  Final   Special Requests BOTTLES DRAWN AEROBIC AND ANAEROBIC 4CC  Final   Culture  Setup Time   Final    GRAM POSITIVE COCCI IN CLUSTERS IN BOTH AEROBIC AND ANAEROBIC BOTTLES CRITICAL VALUE NOTED.  VALUE IS CONSISTENT WITH PREVIOUSLY REPORTED AND CALLED VALUE.    Culture   Final    GRAM POSITIVE COCCI IN CLUSTERS IN BOTH AEROBIC AND ANAEROBIC BOTTLES IDENTIFICATION TO FOLLOW    Report Status PENDING  Incomplete    Medical History: Past Medical History  Diagnosis Date  . Seizures ([place])   . Stroke ([place])   . Brain tumor ([place])   . Diabetes mellitus without complication ([place])   . Arthritis   .  Sleep apnea   . GERD (gastroesophageal reflux disease)   . Asthma   . Long QT interval   . Hypertension   . Panic attacks   . Anxiety   . Depression   . Syncope     Medications:  Scheduled:  . aspirin EC  81 mg Oral Daily  . calcium-vitamin D  1 tablet Oral Daily  . cholecalciferol  2,000 Units Oral Daily  . clonazePAM  1 mg Oral BID  . docusate sodium  100 mg Oral BID  . enoxaparin (LOVENOX) injection  40 mg Subcutaneous BID  . ferrous sulfate  325 mg Oral BID WC  . glipiZIDE  10 mg Oral Daily  . lisinopril  10 mg Oral Daily  . loratadine   10 mg Oral Daily  . metFORMIN  1,000 mg Oral BID WC  . metoprolol tartrate  12.5 mg Oral BID  . morphine      . multivitamin with minerals  1 tablet Oral Daily  . pantoprazole  40 mg Oral Daily  . PARoxetine  40 mg Oral Daily  . pravastatin  10 mg Oral q1800  . sodium chloride  3 mL Intravenous Q12H  . vancomycin  1,500 mg Intravenous Once  . vancomycin  1,500 mg Intravenous Q12H  . zinc sulfate  220 mg Oral Daily   Infusions:   Assessment: Pharmacy consulted to dose vancomycin for 64 yo female being treated for gram positive cocci bacteremia.    Goal of Therapy:  Vancomycin trough level 15-20 mcg/ml  Plan:  Will start patient on vancomycin 1500mg  IV Q12hr for goal trough of 15-20. Will give stacked dose ~ 5 hours after initial dose. Will obtain trough prior to am dose on 11/18.    Pharmacy will continue to monitor and adjust per consult.    Annette Bertelson L 06/03/2015,1:45 PM

## 2015-06-03 NOTE — Progress Notes (Signed)
Patient's pain has improved slightly, patient stated pain 5/10. VS at 0542: T 102.3, BP 125/58, HR 108, RR 20, and SpO2 92% on RA. Administered PRN dose of Ibuprofen 800 mg and notified MD. Nursing staff will continue to monitor. Earleen Reaper, RN

## 2015-06-03 NOTE — Progress Notes (Signed)
Patient has been in bed. Patient complained of severe pain in back and legs while be moved in bed. Administered 2 percocet and patient still complained of severe pain. Patient stated pain was a 10/10. Notfied Dr. Manuella Ghazi of pain. Per MD, he will order PRN dilaudid 1 mg IV. Will continue to monitor patient. Horton Finer

## 2015-06-03 NOTE — Care Management (Addendum)
Patient admitted from home.  Blood cultures are positive and most likely will require at least 2 weeks of IV antibiotics. Physical therapy is recommending skilled nursing facility placement and patient is in agreement.  CSW is involved

## 2015-06-03 NOTE — Progress Notes (Signed)
Received orders to transfer patient to 1C. Called report to receiving RN. Will transfer as soon as possible. Earleen Reaper, RN

## 2015-06-03 NOTE — Progress Notes (Signed)
Patient arrived to 2A Room 234. Patient rates pain 10/10 and all questions answered. Patient oriented to unit and Fall Safety Plan signed. Skin assessment completed with Lexi RN. Bottom red and yeast noted under abdominal folds. Nursing staff will continue to monitor. Earleen Reaper, RN

## 2015-06-04 LAB — CBC
HEMATOCRIT: 31 % — AB (ref 35.0–47.0)
HEMOGLOBIN: 10.3 g/dL — AB (ref 12.0–16.0)
MCH: 29.4 pg (ref 26.0–34.0)
MCHC: 33.2 g/dL (ref 32.0–36.0)
MCV: 88.6 fL (ref 80.0–100.0)
PLATELETS: 110 10*3/uL — AB (ref 150–440)
RBC: 3.5 MIL/uL — AB (ref 3.80–5.20)
RDW: 27.1 % — ABNORMAL HIGH (ref 11.5–14.5)
WBC: 10.7 10*3/uL (ref 3.6–11.0)

## 2015-06-04 LAB — GLUCOSE, CAPILLARY
GLUCOSE-CAPILLARY: 124 mg/dL — AB (ref 65–99)
Glucose-Capillary: 121 mg/dL — ABNORMAL HIGH (ref 65–99)
Glucose-Capillary: 132 mg/dL — ABNORMAL HIGH (ref 65–99)
Glucose-Capillary: 88 mg/dL (ref 65–99)

## 2015-06-04 LAB — BASIC METABOLIC PANEL
ANION GAP: 8 (ref 5–15)
BUN: 28 mg/dL — AB (ref 6–20)
CHLORIDE: 100 mmol/L — AB (ref 101–111)
CO2: 22 mmol/L (ref 22–32)
Calcium: 9.1 mg/dL (ref 8.9–10.3)
Creatinine, Ser: 0.64 mg/dL (ref 0.44–1.00)
GFR calc Af Amer: >60 mL/min (ref >60)
GFR calc non Af Amer: >60 mL/min (ref >60)
GLUCOSE: 141 mg/dL — AB (ref 65–99)
POTASSIUM: 4.4 mmol/L (ref 3.5–5.1)
Sodium: 130 mmol/L — ABNORMAL LOW (ref 135–145)

## 2015-06-04 LAB — VANCOMYCIN, TROUGH: Vancomycin Tr: 18 ug/mL (ref 10–20)

## 2015-06-04 LAB — HEPATITIS B VIRUS (PROFILE VI)
HEP B C TOTAL AB: NEGATIVE
HEP B S AG: NEGATIVE
Hep B C IgM: NEGATIVE
Hep B E Ab: NEGATIVE
Hep B E Ag: NEGATIVE
Hep B S Ab: NONREACTIVE

## 2015-06-04 LAB — HEPATITIS C ANTIBODY

## 2015-06-04 MED ORDER — POLYETHYLENE GLYCOL 3350 17 G PO PACK
17.0000 g | PACK | Freq: Every day | ORAL | Status: DC
  Administered 2015-06-04 – 2015-06-05 (×2): 17 g via ORAL
  Filled 2015-06-04 (×2): qty 1

## 2015-06-04 MED ORDER — ACETAMINOPHEN 325 MG PO TABS
650.0000 mg | ORAL_TABLET | Freq: Four times a day (QID) | ORAL | Status: DC | PRN
  Administered 2015-06-04: 650 mg via ORAL
  Filled 2015-06-04: qty 2

## 2015-06-04 NOTE — Progress Notes (Signed)
ANTIBIOTIC CONSULT NOTE - INITIAL  Pharmacy Consult for vancomycin dosing Indication: bacteremia  Allergies  Allergen Reactions  . Sulfa Antibiotics Anaphylaxis  . Penicillins Rash and Other (See Comments)    Has patient had a PCN reaction causing immediate rash, facial/tongue/throat swelling, SOB or lightheadedness with hypotension: No Has patient had a PCN reaction causing severe rash involving mucus membranes or skin necrosis: No Has patient had a PCN reaction that required hospitalization No Has patient had a PCN reaction occurring within the last 10 years: Yes If all of the above answers are "NO", then may proceed with Cephalosporin use.    Patient Measurements: Height: 5' (152.4 cm) Weight: (!) 315 lb 12.8 oz (143.246 kg) IBW/kg (Calculated) : 45.5 Adjusted Body Weight: 87kg  Vital Signs: Temp: 99.3 F (37.4 C) (11/17 0618) Temp Source: Oral (11/17 0618) BP: 124/57 mmHg (11/17 0502) Pulse Rate: 107 (11/17 0615) Intake/Output from previous day: 11/16 0701 - 11/17 0700 In: 240 [P.O.:240] Out: 0  Intake/Output from this shift: Total I/O In: 120 [P.O.:120] Out: -   Labs:  Recent Labs  06/03/15 0038 06/03/15 0459 06/04/15 0524  WBC 12.0* 10.6 10.7  HGB 10.4* 10.3* 10.3*  PLT 112* 103* 110*  CREATININE 0.65 0.66 0.64   Estimated Creatinine Clearance: 94.9 mL/min (by C-G formula based on Cr of 0.64). No results for input(s): VANCOTROUGH, VANCOPEAK, VANCORANDOM, GENTTROUGH, GENTPEAK, GENTRANDOM, TOBRATROUGH, TOBRAPEAK, TOBRARND, AMIKACINPEAK, AMIKACINTROU, AMIKACIN in the last 72 hours.   Microbiology: Recent Results (from the past 720 hour(s))  Urine culture     Status: None (Preliminary result)   Collection Time: 06/02/15  6:38 PM  Result Value Ref Range Status   Specimen Description URINE, RANDOM  Final   Special Requests NONE  Final   Culture NO GROWTH < 12 HOURS  Final   Report Status PENDING  Incomplete  Blood culture (routine x 2)     Status: None  (Preliminary result)   Collection Time: 06/02/15  6:39 PM  Result Value Ref Range Status   Specimen Description BLOOD RIGHT ARM  Final   Special Requests BOTTLES DRAWN AEROBIC AND ANAEROBIC 4CC  Final   Culture  Setup Time   Final    GRAM POSITIVE COCCI IN CLUSTERS IN BOTH AEROBIC AND ANAEROBIC BOTTLES CRITICAL RESULT CALLED TO, READ BACK BY AND VERIFIED WITH: DONEESHA ROBERTSON [id number] ON 06/03/15 CTJ    Culture   Final    STAPHYLOCOCCUS AUREUS IN BOTH AEROBIC AND ANAEROBIC BOTTLES    Report Status PENDING  Incomplete  Blood culture (routine x 2)     Status: None (Preliminary result)   Collection Time: 06/02/15  6:39 PM  Result Value Ref Range Status   Specimen Description BLOOD RIGHT ASSIST CONTROL  Final   Special Requests BOTTLES DRAWN AEROBIC AND ANAEROBIC 4CC  Final   Culture  Setup Time   Final    GRAM POSITIVE COCCI IN CLUSTERS IN BOTH AEROBIC AND ANAEROBIC BOTTLES CRITICAL VALUE NOTED.  VALUE IS CONSISTENT WITH PREVIOUSLY REPORTED AND CALLED VALUE.    Culture   Final    STAPHYLOCOCCUS AUREUS IN BOTH AEROBIC AND ANAEROBIC BOTTLES SUSCEPTIBILITIES TO FOLLOW    Report Status PENDING  Incomplete    Medical History: Past Medical History  Diagnosis Date  . Seizures ([place])   . Stroke ([place])   . Brain tumor ([place])   . Diabetes mellitus without complication ([place])   . Arthritis   . Sleep apnea   . GERD (gastroesophageal reflux disease)   . Asthma   .  Long QT interval   . Hypertension   . Panic attacks   . Anxiety   . Depression   . Syncope     Medications:  Scheduled:  . aspirin EC  81 mg Oral Daily  . calcium-vitamin D  1 tablet Oral Daily  . cholecalciferol  2,000 Units Oral Daily  . clonazePAM  1 mg Oral BID  . enoxaparin (LOVENOX) injection  40 mg Subcutaneous BID  . ferrous sulfate  325 mg Oral BID WC  . glipiZIDE  10 mg Oral Daily  . insulin aspart  0-15 Units Subcutaneous TID WC  . insulin aspart  0-5 Units Subcutaneous QHS  . lisinopril  10 mg Oral  Daily  . loratadine  10 mg Oral Daily  . metFORMIN  1,000 mg Oral BID WC  . metoprolol tartrate  12.5 mg Oral BID  . multivitamin with minerals  1 tablet Oral Daily  . pantoprazole  40 mg Oral Daily  . PARoxetine  40 mg Oral Daily  . pravastatin  10 mg Oral q1800  . senna-docusate  2 tablet Oral BID  . sodium chloride  3 mL Intravenous Q12H  . vancomycin  1,500 mg Intravenous Q12H  . zinc sulfate  220 mg Oral Daily   Assessment: Pharmacy consulted to dose vancomycin for 64 yo female being treated for gram positive cocci bacteremia.  BCx x2 growing staph aureus, sensitivities pending.   11/17: Three doses of vanc charted as given within minutes of each other yesterday around 1430, likely charting issues (different RN from today). SCr stable today.   Goal of Therapy:  Vancomycin trough level 15-20 mcg/ml  Plan:  Will start patient on vancomycin 1500mg  IV Q12hr for goal trough of 15-20. Will give stacked dose ~ 5 hours after initial dose.   11/17: Will move trough to today at 1930, before 3rd dose to ensure level within goal range and that extra doses were not given yesterday.  Pt at risk for accumulation. Will need to continue to follow renal function closely and recheck level at steady state.   Pharmacy will continue to monitor and adjust per consult.    Rayna Sexton L 06/04/2015,10:22 AM

## 2015-06-04 NOTE — Progress Notes (Signed)
ANTIBIOTIC CONSULT NOTE - FOLLOW UP  Pharmacy Consult for vancomycin Indication: Bacteremia  Allergies  Allergen Reactions  . Sulfa Antibiotics Anaphylaxis  . Penicillins Rash and Other (See Comments)    Has patient had a PCN reaction causing immediate rash, facial/tongue/throat swelling, SOB or lightheadedness with hypotension: No Has patient had a PCN reaction causing severe rash involving mucus membranes or skin necrosis: No Has patient had a PCN reaction that required hospitalization No Has patient had a PCN reaction occurring within the last 10 years: Yes If all of the above answers are "NO", then may proceed with Cephalosporin use.    Patient Measurements: Height: 5' (152.4 cm) Weight: (!) 315 lb 12.8 oz (143.246 kg) IBW/kg (Calculated) : 45.5 Adjusted Body Weight: 87 kg  Vital Signs: Temp: 99 F (37.2 C) (11/17 2152) Temp Source: Oral (11/17 2152) BP: 140/70 mmHg (11/17 2152) Pulse Rate: 94 (11/17 2152) Intake/Output from previous day: 11/16 0701 - 11/17 0700 In: 240 [P.O.:240] Out: 0  Intake/Output from this shift:    Labs:  Recent Labs  06/03/15 0038 06/03/15 0459 06/04/15 0524  WBC 12.0* 10.6 10.7  HGB 10.4* 10.3* 10.3*  PLT 112* 103* 110*  CREATININE 0.65 0.66 0.64   Estimated Creatinine Clearance: 94.9 mL/min (by C-G formula based on Cr of 0.64).  Recent Labs  06/04/15 1933  [place] 18     Microbiology: Recent Results (from the past 720 hour(s))  Urine culture     Status: None (Preliminary result)   Collection Time: 06/02/15  6:38 PM  Result Value Ref Range Status   Specimen Description URINE, RANDOM  Final   Special Requests NONE  Final   Culture   Final    30,000 COLONIES/mL STAPHYLOCOCCUS AUREUS SUSCEPTIBILITIES TO FOLLOW    Report Status PENDING  Incomplete  Blood culture (routine x 2)     Status: None (Preliminary result)   Collection Time: 06/02/15  6:39 PM  Result Value Ref Range Status   Specimen Description BLOOD RIGHT ARM   Final   Special Requests BOTTLES DRAWN AEROBIC AND ANAEROBIC 4CC  Final   Culture  Setup Time   Final    GRAM POSITIVE COCCI IN CLUSTERS IN BOTH AEROBIC AND ANAEROBIC BOTTLES CRITICAL RESULT CALLED TO, READ BACK BY AND VERIFIED WITH: DONEESHA ROBERTSON [id number] ON 06/03/15 CTJ    Culture   Final    STAPHYLOCOCCUS AUREUS IN BOTH AEROBIC AND ANAEROBIC BOTTLES    Report Status PENDING  Incomplete  Blood culture (routine x 2)     Status: None (Preliminary result)   Collection Time: 06/02/15  6:39 PM  Result Value Ref Range Status   Specimen Description BLOOD RIGHT ASSIST CONTROL  Final   Special Requests BOTTLES DRAWN AEROBIC AND ANAEROBIC 4CC  Final   Culture  Setup Time   Final    GRAM POSITIVE COCCI IN CLUSTERS IN BOTH AEROBIC AND ANAEROBIC BOTTLES CRITICAL VALUE NOTED.  VALUE IS CONSISTENT WITH PREVIOUSLY REPORTED AND CALLED VALUE.    Culture   Final    STAPHYLOCOCCUS AUREUS IN BOTH AEROBIC AND ANAEROBIC BOTTLES SUSCEPTIBILITIES TO FOLLOW    Report Status PENDING  Incomplete  Culture, blood (routine x 2)     Status: None (Preliminary result)   Collection Time: 06/03/15  3:34 PM  Result Value Ref Range Status   Specimen Description BLOOD LEFT ARM  Final   Special Requests BOTTLES DRAWN AEROBIC AND ANAEROBIC 4CC  Final   Culture NO GROWTH < 24 HOURS  Final   Report  Status PENDING  Incomplete  Culture, blood (routine x 2)     Status: None (Preliminary result)   Collection Time: 06/03/15  3:46 PM  Result Value Ref Range Status   Specimen Description BLOOD LEFT ARM  Final   Special Requests BOTTLES DRAWN AEROBIC AND ANAEROBIC 4CC  Final   Culture NO GROWTH < 24 HOURS  Final   Report Status PENDING  Incomplete    Anti-infectives    Start     Dose/Rate Route Frequency Ordered Stop   06/03/15 2000  vancomycin (VANCOCIN) 1,500 mg in sodium chloride 0.9 % 500 mL IVPB     1,500 mg 250 mL/hr over 120 Minutes Intravenous Every 12 hours 06/03/15 1345     06/03/15 1400  vancomycin  (VANCOCIN) 1,500 mg in sodium chloride 0.9 % 500 mL IVPB     1,500 mg 250 mL/hr over 120 Minutes Intravenous  Once 06/03/15 1343 06/03/15 1632   06/03/15 1230  vancomycin (VANCOCIN) IVPB 1000 mg/200 mL premix  Status:  Discontinued     1,000 mg 200 mL/hr over 60 Minutes Intravenous Every 24 hours 06/03/15 1223 06/03/15 1343      Assessment: Pharmacy dosing vancomycin in this 64 year old female being treated for gram positive bacteremia. Three doses of vancomycin charted within minutes of each other yesterday which is most likely a charting issue. Vancomycin trough ordered for today to ensure patient not supratherapeutic.   Vancomycin regimen is 1500 mg IV q12h (currently scheduled for 1130 and 2330) Vancomycin trough drawn at 1930 was 18 mcg/mL and represented an 8 hour level.   Goal of Therapy:  Vancomycin trough level 15-20 mcg/ml  Plan:  Follow up culture results  Continue with current regimen of vancomycin 1500 mg IV q12h and check trough just prior to tomorrow AM dose at 1100.   Pharmacy will continue to follow.  Darylene Price Shela Esses 06/04/2015,11:00 PM

## 2015-06-04 NOTE — Progress Notes (Signed)
Pt is drowsy but alert, c/o pain with movement, given prn dilaudid, on room air, family at bedside, bed bound, physical therapy attempted to work with patient, poor appetite, receiving IV antibiotics, fever throughout shift improved with tylenol, MD aware, sodium serum remains at 130, pt is on po medications for diabetes and not accepting insulin sliding scale at this time. No bm throughout shift, given stool softner and miralax.

## 2015-06-04 NOTE — Progress Notes (Signed)
[facility] NAME: Sharon Kirby    MR#:  [id number]  DATE OF BIRTH:  07-Jul-1951  SUBJECTIVE:  CHIEF COMPLAINT:   Chief Complaint  Patient presents with  . Back Pain  sleeping comfortably. REVIEW OF SYSTEMS:  Review of Systems  Constitutional: Negative for fever, weight loss, malaise/fatigue and diaphoresis.  HENT: Negative for ear discharge, ear pain, hearing loss, nosebleeds, sore throat and tinnitus.   Eyes: Negative for blurred vision and pain.  Respiratory: Negative for cough, hemoptysis, shortness of breath and wheezing.   Cardiovascular: Negative for chest pain, palpitations, orthopnea and leg swelling.  Gastrointestinal: Negative for heartburn, nausea, vomiting, abdominal pain, diarrhea, constipation and blood in stool.  Genitourinary: Negative for dysuria, urgency and frequency.  Musculoskeletal: Positive for myalgias, back pain, joint pain, falls and neck pain.  Skin: Negative for itching and rash.  Neurological: Negative for dizziness, tingling, tremors, focal weakness, seizures, weakness and headaches.  Psychiatric/Behavioral: Negative for depression. The patient is not nervous/anxious.    DRUG ALLERGIES:   Allergies  Allergen Reactions  . Sulfa Antibiotics Anaphylaxis  . Penicillins Rash and Other (See Comments)    Has patient had a PCN reaction causing immediate rash, facial/tongue/throat swelling, SOB or lightheadedness with hypotension: No Has patient had a PCN reaction causing severe rash involving mucus membranes or skin necrosis: No Has patient had a PCN reaction that required hospitalization No Has patient had a PCN reaction occurring within the last 10 years: Yes If all of the above answers are "NO", then may proceed with Cephalosporin use.   VITALS:  Blood pressure 124/57, pulse 107, temperature 99.3 F (37.4 C), temperature source Oral, resp. rate 20, height 5' (1.524 m), weight 143.246 kg (315  lb 12.8 oz), SpO2 93 %. PHYSICAL EXAMINATION:  Physical Exam  Constitutional: She is well-developed, well-nourished, and in no distress.  HENT:  Head: Normocephalic and atraumatic.  Eyes: Conjunctivae and EOM are normal. Pupils are equal, round, and reactive to light.  Neck: Normal range of motion. Neck supple. No tracheal deviation present. No thyromegaly present.  Cardiovascular: Normal rate, regular rhythm and normal heart sounds.   Pulmonary/Chest: Effort normal and breath sounds normal. No respiratory distress. She has no wheezes. She exhibits no tenderness.  Abdominal: Soft. Bowel sounds are normal. She exhibits no distension. There is no tenderness.  Musculoskeletal: Normal range of motion.       Lumbar back: She exhibits tenderness and bony tenderness.  Neurological: No cranial nerve deficit.  sleepy  Skin: Skin is warm and dry. No rash noted.  Psychiatric:  sleepy   LABORATORY PANEL:   CBC  Recent Labs Lab 06/04/15 0524  WBC 10.7  HGB 10.3*  HCT 31.0*  PLT 110*   ------------------------------------------------------------------------------------------------------------------ Chemistries   Recent Labs Lab 06/04/15 0524  NA 130*  K 4.4  CL 100*  CO2 22  GLUCOSE 141*  BUN 28*  CREATININE 0.64  CALCIUM 9.1   RADIOLOGY:  No results found. ASSESSMENT AND PLAN:  64 y.o. female with a known history of diabetic mellitus, hypertension, anxiety and depression and multiple other medical problems is brought into the ED after she sustained a fall and having low back pain. Patient initially fell 5 days ago while trying to reach a teaspoon which fell on the floor.  * Staph Aureus Sepsis: Present on admission. Blood cultures positive for staph aureus, 4 out of 4 bottles.  Continue vancomycin. will need 2 weeks IV Abx per ID   *  Cirrhosis of Liver: checking Hep serologies, could be fatty liver.  * Elevated troponin: due to supply demand ischemia  * Acute on chronic  low back pain and right knee pain status post frequent falls X-ray with no acute fractures but 8 mm anterolisthesis L5 on S1 vertebral body Pain management as needed PT is recommending skilled nursing care Consult care management regarding placement Pending orthopedics c/s Continue home medication Robaxin for muscle relaxation  * Hyperkalemia and hyponatremia: Improving  * Diabetes mellitus Continue sliding scale insulin hemoglobin A1c 6.0  Continue patient's home medication glipizide and metformin  * Essential hypertension Continue home medication list no pedal and titrate as needed basis DVT prophylaxis with Lovenox subcutaneous  * History of depression and anxiety Continue home medications Klonopin     All the records are reviewed and case discussed with Care Management/Social Worker. Management plans discussed with the patient, family and they are in agreement.  CODE STATUS: Full Code  TOTAL TIME TAKING CARE OF THIS PATIENT: 35 minutes.   More than 50% of the time was spent in counseling/coordination of care: YES  POSSIBLE D/C IN 1-2 DAYS, DEPENDING ON CLINICAL CONDITION.   [facility], Janisse Ghan M.D on 06/04/2015 at 2:23 PM  Between 7am to 6pm - Pager - [phone]  After 6pm go to [email or web address] - password [email or web address] [place] [facility]  Office  [phone]  CC: Primary care physician; Madelyn Brunner, MD

## 2015-06-04 NOTE — Progress Notes (Signed)
PT Cancellation Note  Patient Details Name: Sharon Kirby MRN: [id number] DOB: 1951-07-12   Cancelled Treatment:    Reason Eval/Treat Not Completed: Other (comment) (See PT note for further details) PT attempted this AM, however pt lethargic and not able to consistently attend to task. Per nursing, this may be due to medications. Will attempt at later time/date when better able to attend.    Janyth Contes 06/04/2015, 10:51 AM Janyth Contes, SPT. [phone].

## 2015-06-04 NOTE — Care Management Important Message (Signed)
Important Message  Patient Details  Name: Sharon Kirby MRN: [id number] Date of Birth: [birth year]   Medicare Important Message Given:  Yes    Shelbie Ammons, RN 06/04/2015, 10:21 AM

## 2015-06-04 NOTE — Plan of Care (Signed)
Problem: Pain Managment: Goal: General experience of comfort will improve Outcome: Progressing Pt c/o generalized pain, pt tx from 2A, found crying in pain. Dilaudid given x1 with decreased pain. Pt hurts with movement in bed. Continue to assess.  Problem: Physical Regulation: Goal: Will remain free from infection Outcome: Progressing Pt remains afebrile, antibiotics continued at this time. PO fluids encouraged.  Problem: Activity: Goal: Risk for activity intolerance will decrease Outcome: Progressing Pt has generalized weakness, incontinent of urine at this time. Unable to get out of bed at this time. High Fall Risk, encouraged to call for assistance when needed.

## 2015-06-04 NOTE — Clinical Social Work Note (Signed)
Clinical Education officer, museum spoke with [employer] regarding [facility]. Josem Kaufmann is dated for tomorrow, if ptis ready tomorrow or over the weekend. CSW udpated MD. Per MD, pt may be ready for discharge tomorrow. CSW will continue to follow.   Darden Dates, MSW, LCSW Clinical Social Worker  [phone]

## 2015-06-05 ENCOUNTER — Other Ambulatory Visit: Payer: Self-pay

## 2015-06-05 ENCOUNTER — Inpatient Hospital Stay: Payer: Commercial Managed Care - HMO

## 2015-06-05 DIAGNOSIS — R410 Disorientation, unspecified: Secondary | ICD-10-CM

## 2015-06-05 LAB — GLUCOSE, CAPILLARY
GLUCOSE-CAPILLARY: 68 mg/dL (ref 65–99)
GLUCOSE-CAPILLARY: 58 mg/dL — AB (ref 65–99)
GLUCOSE-CAPILLARY: 81 mg/dL (ref 65–99)
Glucose-Capillary: 70 mg/dL (ref 65–99)
Glucose-Capillary: 54 mg/dL — ABNORMAL LOW (ref 65–99)
Glucose-Capillary: 104 mg/dL — ABNORMAL HIGH (ref 65–99)
Glucose-Capillary: 46 mg/dL — ABNORMAL LOW (ref 65–99)

## 2015-06-05 LAB — CULTURE, BLOOD (ROUTINE X 2)

## 2015-06-05 LAB — URINE CULTURE: Culture: 30000

## 2015-06-05 MED ORDER — OXYCODONE-ACETAMINOPHEN 5-325 MG PO TABS: 1.0000 | ORAL_TABLET | Freq: Four times a day (QID) | ORAL | Status: DC | PRN

## 2015-06-05 MED ORDER — FLEET ENEMA 7-19 GM/118ML RE ENEM
1.0000 | ENEMA | RECTAL | Status: AC
  Administered 2015-06-05: 14:00:00 1 via RECTAL

## 2015-06-05 MED ORDER — DEXTROSE 50 % IV SOLN
INTRAVENOUS | Status: AC
  Administered 2015-06-05: 17:00:00 25 mL
  Filled 2015-06-05: qty 50

## 2015-06-05 MED ORDER — HYDROCODONE-ACETAMINOPHEN 5-325 MG PO TABS: 1.0000 | ORAL_TABLET | Freq: Four times a day (QID) | ORAL | Status: DC | PRN

## 2015-06-05 MED ORDER — CEFAZOLIN SODIUM-DEXTROSE 2-3 GM-% IV SOLR
2.0000 g | INTRAVENOUS | Status: AC
  Administered 2015-06-05: 2 g via INTRAVENOUS
  Filled 2015-06-05: qty 50

## 2015-06-05 MED ORDER — DEXTROSE 5 % IV SOLN: 2.0000 g | Freq: Three times a day (TID) | INTRAVENOUS | Status: DC

## 2015-06-05 MED ORDER — FLEET ENEMA 7-19 GM/118ML RE ENEM: 1.0000 | ENEMA | RECTAL | Status: DC

## 2015-06-05 MED ORDER — BISACODYL 10 MG RE SUPP
10.0000 mg | Freq: Every day | RECTAL | Status: DC
  Administered 2015-06-05: 10 mg via RECTAL
  Filled 2015-06-05: qty 1

## 2015-06-05 NOTE — Clinical Social Work Note (Signed)
Pt is ready for discharge today to [facility]. Facility has received discharge information and is ready to admit pt. Pt and son are agreeable to discharge plan. RN will call report and EMS will provide transportation. CSW is signing off as no further needs identified.   Darden Dates, MSW, LCSW Clinical Social Worker  [phone]

## 2015-06-05 NOTE — Consult Note (Signed)
[facility] Face-to-Face Psychiatry Consult   Reason for Consult:  Consult for this 64 year old woman currently in the hospital with sepsis related to a skin breakdown. Also hyponatremia also GI bleed also possible cirrhosis. Consult was labeled as being for depression. Referring Physician:  Manuella Ghazi Patient Identification: Sharon Kirby MRN:  [id number] Principal Diagnosis: <principal problem not specified> Diagnosis:   Patient Active Problem List   Diagnosis Date Noted  . Hyponatremia [E87.1] 06/02/2015  . Acute blood loss anemia [D62] 04/13/2015  . Iron deficiency anemia [D50.9]   . Angiodysplasia of intestinal tract [K55.20]   . GI bleed [K92.2] 04/09/2015    Total Time spent with patient: 1 hour  Subjective:   Sharon Kirby is a 64 y.o. female patient admitted with "oh I hurt all over! 000!".  HPI:  Information from the patient from the chart and from the patient's son. This 30 year old woman without a clear past psychiatric history came into the hospital with what appears to be a sepsis from a skin infection. Several other medical problems identified as well. Consult was requested today and labeled as being for depression. Patient is also pending for discharge. On interview today the patient is unable to give me any useful history at all. She tends to repeat things back to me and then just starts sobbing uncontrollably. All of her sentences eventually turn into just repeating the same syllable over and over again. She was able to tell me her name after I ask her several times. She never was able to tell me where she was right now. She told me that she hurt all over her body. When I ask her about various parts of her body and which one hurt the most she would inevitably tell me that whichever part I named most recently was the one that hurt terribly. She appears to be very confused. Unable to focus or concentrate. Her son tells me that he had never seen her like this in his life before until  she was in the hospital and started to be given pain medicine. I noticed however that it doesn't look like she has really gotten a significant amount of pain medicine today. Patient was not able to answer any other questions regarding mood symptoms.  Social history: Patient lives with her son. The rest of the whole social environment is not clear to me. Son reports that at baseline she is cognitively clear.  Medical history: Patient does have a history of several medical problems in the past. She is morbidly overweight has a history of GI problems and GI bleeds and anemia. Currently appears to have a sepsis related to a staph infection.  Substance abuse history: No history of alcohol or drug abuse.  Past Psychiatric History: Son tells me that he knows of no past mental health history whatsoever. I found no evidence in the chart of any past mental health history either. There is nothing in the chart that clearly indicates to me what her baseline mental state is like  Risk to Self: Is patient at risk for suicide?: No Risk to Others:   Prior Inpatient Therapy:   Prior Outpatient Therapy:    Past Medical History:  Past Medical History  Diagnosis Date  . Seizures ([place])   . Stroke ([place])   . Brain tumor ([place])   . Diabetes mellitus without complication ([place])   . Arthritis   . Sleep apnea   . GERD (gastroesophageal reflux disease)   . Asthma   . Long QT  interval   . Hypertension   . Panic attacks   . Anxiety   . Depression   . Syncope     Past Surgical History  Procedure Laterality Date  . Csf shunt      x2 due to tumor  . Cholecystectomy    . Tonsillectomy    . Carpal tunnel release Left   . Trigger finger release Left   . Knee surgery Left   . Colonoscopy with propofol N/A 04/10/2015    Procedure: COLONOSCOPY WITH PROPOFOL;  Surgeon: Lucilla Lame, MD;  Location: [facility] ENDOSCOPY;  Service: Endoscopy;  Laterality: N/A;  . Esophagogastroduodenoscopy (egd) with propofol N/A 04/10/2015     Procedure: ESOPHAGOGASTRODUODENOSCOPY (EGD) WITH PROPOFOL;  Surgeon: Lucilla Lame, MD;  Location: [facility] ENDOSCOPY;  Service: Endoscopy;  Laterality: N/A;   Family History: History reviewed. No pertinent family history. Family Psychiatric  History: Son states that he knows of no family history of any mental health or substance abuse problems Social History:  History  Alcohol Use No     History  Drug Use No    Social History   Social History  . Marital Status: Single    Spouse Name: N/A  . Number of Children: N/A  . Years of Education: N/A   Social History Main Topics  . Smoking status: Never Smoker   . Smokeless tobacco: Never Used  . Alcohol Use: No  . Drug Use: No  . Sexual Activity: No   Other Topics Concern  . None   Social History Narrative   Additional Social History:                          Allergies:   Allergies  Allergen Reactions  . Sulfa Antibiotics Anaphylaxis  . Penicillins Rash and Other (See Comments)    Has patient had a PCN reaction causing immediate rash, facial/tongue/throat swelling, SOB or lightheadedness with hypotension: No Has patient had a PCN reaction causing severe rash involving mucus membranes or skin necrosis: No Has patient had a PCN reaction that required hospitalization No Has patient had a PCN reaction occurring within the last 10 years: Yes If all of the above answers are "NO", then may proceed with Cephalosporin use.    Labs:  Results for orders placed or performed during the hospital encounter of 06/02/15 (from the past 48 hour(s))  Glucose, capillary     Status: Abnormal   Collection Time: 06/03/15  9:05 PM  Result Value Ref Range   Glucose-Capillary 107 (H) 65 - 99 mg/dL   Comment 1 Notify RN   CBC     Status: Abnormal   Collection Time: 06/04/15  5:24 AM  Result Value Ref Range   WBC 10.7 3.6 - 11.0 K/uL   RBC 3.50 (L) 3.80 - 5.20 MIL/uL   Hemoglobin 10.3 (L) 12.0 - 16.0 g/dL   HCT 31.0 (L) 35.0 - 47.0 %    MCV 88.6 80.0 - 100.0 fL   MCH 29.4 26.0 - 34.0 pg   MCHC 33.2 32.0 - 36.0 g/dL   RDW 27.1 (H) 11.5 - 14.5 %   Platelets 110 (L) 150 - 440 K/uL  Basic metabolic panel     Status: Abnormal   Collection Time: 06/04/15  5:24 AM  Result Value Ref Range   Sodium 130 (L) 135 - 145 mmol/L   Potassium 4.4 3.5 - 5.1 mmol/L   Chloride 100 (L) 101 - 111 mmol/L   CO2 22 22 -  32 mmol/L   Glucose, Bld 141 (H) 65 - 99 mg/dL   BUN 28 (H) 6 - 20 mg/dL   Creatinine, Ser 0.64 0.44 - 1.00 mg/dL   Calcium 9.1 8.9 - 10.3 mg/dL   GFR calc non Af Amer >60 >60 mL/min   GFR calc Af Amer >60 >60 mL/min    Comment: (NOTE) The eGFR has been calculated using the CKD EPI equation. This calculation has not been validated in all clinical situations. eGFR's persistently <60 mL/min signify possible Chronic Kidney Disease.    Anion gap 8 5 - 15  Glucose, capillary     Status: Abnormal   Collection Time: 06/04/15  7:48 AM  Result Value Ref Range   Glucose-Capillary 121 (H) 65 - 99 mg/dL  Glucose, capillary     Status: Abnormal   Collection Time: 06/04/15 11:27 AM  Result Value Ref Range   Glucose-Capillary 124 (H) 65 - 99 mg/dL  Glucose, capillary     Status: Abnormal   Collection Time: 06/04/15  4:30 PM  Result Value Ref Range   Glucose-Capillary 132 (H) 65 - 99 mg/dL  Vancomycin, trough     Status: None   Collection Time: 06/04/15  7:33 PM  Result Value Ref Range   Vancomycin Tr 18 10 - 20 ug/mL  Glucose, capillary     Status: None   Collection Time: 06/04/15  9:53 PM  Result Value Ref Range   Glucose-Capillary 88 65 - 99 mg/dL   Comment 1 Notify RN   Glucose, capillary     Status: None   Collection Time: 06/05/15  7:41 AM  Result Value Ref Range   Glucose-Capillary 70 65 - 99 mg/dL  Glucose, capillary     Status: Abnormal   Collection Time: 06/05/15 11:38 AM  Result Value Ref Range   Glucose-Capillary 54 (L) 65 - 99 mg/dL   Comment 1 Notify RN   Glucose, capillary     Status: None   Collection  Time: 06/05/15 12:24 PM  Result Value Ref Range   Glucose-Capillary 68 65 - 99 mg/dL   Comment 1 Notify RN   Glucose, capillary     Status: Abnormal   Collection Time: 06/05/15 12:56 PM  Result Value Ref Range   Glucose-Capillary 58 (L) 65 - 99 mg/dL   Comment 1 Notify RN   Glucose, capillary     Status: None   Collection Time: 06/05/15  1:23 PM  Result Value Ref Range   Glucose-Capillary 81 65 - 99 mg/dL  Glucose, capillary     Status: Abnormal   Collection Time: 06/05/15  4:34 PM  Result Value Ref Range   Glucose-Capillary 46 (L) 65 - 99 mg/dL   Comment 1 Notify RN   Glucose, capillary     Status: Abnormal   Collection Time: 06/05/15  5:10 PM  Result Value Ref Range   Glucose-Capillary 104 (H) 65 - 99 mg/dL    Current Facility-Administered Medications  Medication Dose Route Frequency Provider Last Rate Last Dose  . acetaminophen (TYLENOL) tablet 650 mg  650 mg Oral Q6H PRN Max Sane, MD   650 mg at 06/04/15 1537  . aspirin EC tablet 81 mg  81 mg Oral Daily Nicholes Mango, MD   81 mg at 06/05/15 1044  . bisacodyl (DULCOLAX) suppository 10 mg  10 mg Rectal Daily Max Sane, MD   10 mg at 06/05/15 1309  . calcium-vitamin D (OSCAL WITH D) 500-200 MG-UNIT per tablet 1 tablet  1 tablet Oral  Daily Nicholes Mango, MD   1 tablet at 06/05/15 1044  . cholecalciferol (VITAMIN D) tablet 2,000 Units  2,000 Units Oral Daily Nicholes Mango, MD   2,000 Units at 06/05/15 1042  . clonazePAM (KLONOPIN) tablet 1 mg  1 mg Oral BID Nicholes Mango, MD   1 mg at 06/05/15 1044  . dimenhyDRINATE (DRAMAMINE) tablet 50 mg  50 mg Oral Q8H PRN Nicholes Mango, MD      . docusate sodium (COLACE) capsule 100 mg  100 mg Oral BID PRN Nicholes Mango, MD   100 mg at 06/04/15 0920  . enoxaparin (LOVENOX) injection 40 mg  40 mg Subcutaneous BID Nicholes Mango, MD   40 mg at 06/05/15 1310  . ferrous sulfate tablet 325 mg  325 mg Oral BID WC Nicholes Mango, MD   325 mg at 06/05/15 1044  . glipiZIDE (GLUCOTROL XL) 24 hr tablet 10 mg  10 mg  Oral Daily Nicholes Mango, MD   10 mg at 06/05/15 1044  . HYDROcodone-acetaminophen (NORCO/VICODIN) 5-325 MG per tablet 1 tablet  1 tablet Oral Q6H PRN Nicholes Mango, MD   1 tablet at 06/03/15 1531  . HYDROmorphone (DILAUDID) injection 1 mg  1 mg Intravenous Q4H PRN Max Sane, MD   1 mg at 06/04/15 2355  . hydrOXYzine (ATARAX/VISTARIL) tablet 25 mg  25 mg Oral TID PRN Nicholes Mango, MD      . ibuprofen (ADVIL,MOTRIN) tablet 800 mg  800 mg Oral Q8H PRN Nicholes Mango, MD   800 mg at 06/03/15 0546  . insulin aspart (novoLOG) injection 0-15 Units  0-15 Units Subcutaneous TID WC Max Sane, MD   0 Units at 06/03/15 1746  . insulin aspart (novoLOG) injection 0-5 Units  0-5 Units Subcutaneous QHS Max Sane, MD   0 Units at 06/03/15 2203  . lisinopril (PRINIVIL,ZESTRIL) tablet 10 mg  10 mg Oral Daily Nicholes Mango, MD   10 mg at 06/05/15 1042  . loratadine (CLARITIN) tablet 10 mg  10 mg Oral Daily Nicholes Mango, MD   10 mg at 06/05/15 1044  . metFORMIN (GLUCOPHAGE) tablet 1,000 mg  1,000 mg Oral BID WC Nicholes Mango, MD   1,000 mg at 06/04/15 1656  . methocarbamol (ROBAXIN) tablet 500 mg  500 mg Oral Q6H PRN Nicholes Mango, MD   500 mg at 06/03/15 0330  . metoprolol tartrate (LOPRESSOR) tablet 12.5 mg  12.5 mg Oral BID Nicholes Mango, MD   12.5 mg at 06/05/15 1043  . multivitamin with minerals tablet 1 tablet  1 tablet Oral Daily Nicholes Mango, MD   1 tablet at 06/05/15 1044  . ondansetron (ZOFRAN) tablet 4 mg  4 mg Oral Q6H PRN Nicholes Mango, MD       Or  . ondansetron (ZOFRAN) injection 4 mg  4 mg Intravenous Q6H PRN Nicholes Mango, MD   4 mg at 06/04/15 1434  . oxyCODONE-acetaminophen (PERCOCET/ROXICET) 5-325 MG per tablet 1-2 tablet  1-2 tablet Oral Q6H PRN Nicholes Mango, MD   2 tablet at 06/04/15 2254  . pantoprazole (PROTONIX) EC tablet 40 mg  40 mg Oral Daily Nicholes Mango, MD   40 mg at 06/05/15 1041  . PARoxetine (PAXIL) tablet 40 mg  40 mg Oral Daily Nicholes Mango, MD   40 mg at 06/05/15 1044  . polyethylene glycol (MIRALAX /  GLYCOLAX) packet 17 g  17 g Oral Daily Vipul Shah, MD   17 g at 06/05/15 1045  . pravastatin (PRAVACHOL) tablet 10 mg  10 mg Oral  [id number] Nicholes Mango, MD   10 mg at 06/04/15 1656  . senna-docusate (Senokot-S) tablet 2 tablet  2 tablet Oral BID Max Sane, MD   2 tablet at 06/05/15 1041  . sodium chloride 0.9 % injection 3 mL  3 mL Intravenous Q12H Nicholes Mango, MD   3 mL at 06/05/15 1046  . sodium phosphate (FLEET) 7-19 GM/118ML enema 1 enema  1 enema Rectal STAT Max Sane, MD   1 enema at 06/05/15 1245  . zinc sulfate capsule 220 mg  220 mg Oral Daily Nicholes Mango, MD   220 mg at 06/05/15 1041    Musculoskeletal: Strength & Muscle Tone: decreased Gait & Station: unable to stand Patient leans: N/A  Psychiatric Specialty Exam: Review of Systems  Unable to perform ROS: mental status change    Blood pressure 174/79, pulse 102, temperature 99.2 F (37.3 C), temperature source Oral, resp. rate 18, height 5' (1.524 m), weight 143.246 kg (315 lb 12.8 oz), SpO2 96 %.Body mass index is 61.68 kg/(m^2).  General Appearance: Disheveled  Eye Contact::  Minimal  Speech:  Garbled and Slurred  Volume:  Decreased  Mood:  Dysphoric  Affect:  Labile and Tearful  Thought Process:  Loose and Tangential  Orientation:  Other:  Patient was never able to telemetry where she was or when this was or give any indication she understood why she was here  Thought Content:  Negative  Suicidal Thoughts:  No  Homicidal Thoughts:  No  Memory:  Negative  Judgement:  Impaired  Insight:  Lacking  Psychomotor Activity:  Decreased  Concentration:  Poor  Recall:  Poor  Fund of Knowledge:Poor  Language: Poor  Akathisia:  No  Handed:  Right  AIMS (if indicated):     Assets:  Social Support  ADL's:  Impaired  Cognition: Impaired,  Moderate  Sleep:      Treatment Plan Summary: Medication management and Plan Although the consult was labeled as being for depression the patient to my exam is clearly delirious. Her  mental state is waxing and waning and she is extremely confused. Unable to concentrate or answer any questions. She is indicating that it is because of pain but does not really seem to be indicating any locus of pain. She has a diffusely distressed look on her face. I doubt that this is related to major depression but probably is a delirium related to multiple medical problems. Son blames it on pain medicine although I don't see that she's been getting a very significant amount of that. Could just be from the sepsis and the metabolic disturbance. Could be from pain. Hard to tell given I really don't trust that I know what her baseline is. In her current condition I expect that she will probably do quite poorly at any facility that she is discharged to. I am going to recommend giving her a very small dose of risperidone twice a day orally see if that calms down some of the agitation. We would hope that if she is able to get more medically stable the rest of this will clear up. If needed to have somebody see her and she is still over the weekend please call the psychiatrist on call for consults.  Disposition: Supportive therapy provided about ongoing stressors. See note above with plan when she goes to a facility  [place] 06/05/2015 5:34 PM

## 2015-06-05 NOTE — Progress Notes (Signed)
Infectious Disease Long Term IV Antibiotic Orders  Diagnosis: MSSA bacteremia, Thigh cellulitis, Morbid obesity  Culture results Results for orders placed or performed during the hospital encounter of 06/02/15  Urine culture     Status: None (Preliminary result)   Collection Time: 06/02/15  6:38 PM  Result Value Ref Range Status   Specimen Description URINE, RANDOM  Final   Special Requests NONE  Final   Culture   Final    30,000 COLONIES/mL STAPHYLOCOCCUS AUREUS SUSCEPTIBILITIES TO FOLLOW    Report Status PENDING  Incomplete  Blood culture (routine x 2)     Status: None   Collection Time: 06/02/15  6:39 PM  Result Value Ref Range Status   Specimen Description BLOOD RIGHT ARM  Final   Special Requests BOTTLES DRAWN AEROBIC AND ANAEROBIC 4CC  Final   Culture  Setup Time   Final    GRAM POSITIVE COCCI IN CLUSTERS IN BOTH AEROBIC AND ANAEROBIC BOTTLES CRITICAL RESULT CALLED TO, READ BACK BY AND VERIFIED WITH: DONEESHA ROBERTSON [id number] ON 06/03/15 CTJ    Culture   Final    STAPHYLOCOCCUS AUREUS IN BOTH AEROBIC AND ANAEROBIC BOTTLES    Report Status 06/05/2015 FINAL  Final   Organism ID, Bacteria STAPHYLOCOCCUS AUREUS  Final      Susceptibility   Staphylococcus aureus - MIC*    CIPROFLOXACIN >=8 RESISTANT Resistant     ERYTHROMYCIN 4 RESISTANT Resistant     GENTAMICIN <=0.5 SENSITIVE Sensitive     OXACILLIN <=0.25 SENSITIVE Sensitive     TRIMETH/SULFA <=10 SENSITIVE Sensitive     CLINDAMYCIN <=0.25 RESISTANT Resistant     CEFOXITIN SCREEN NEGATIVE Sensitive     Inducible Clindamycin Value in next row Resistant      POSITIVEINDUCIBLE CLINDAMYCIN RESISTANCE - A positive ICR test is indicative of inducible resistance to macrolides, lincosamides, and type B streptogramin.  This isolate is presumed to be resistant to Clindamycin, however, Clindamycin may still be effective in some patients.     TETRACYCLINE Value in next row Sensitive      SENSITIVE<=1    * STAPHYLOCOCCUS AUREUS   Blood culture (routine x 2)     Status: None   Collection Time: 06/02/15  6:39 PM  Result Value Ref Range Status   Specimen Description BLOOD RIGHT ASSIST CONTROL  Final   Special Requests BOTTLES DRAWN AEROBIC AND ANAEROBIC 4CC  Final   Culture  Setup Time   Final    GRAM POSITIVE COCCI IN CLUSTERS IN BOTH AEROBIC AND ANAEROBIC BOTTLES CRITICAL VALUE NOTED.  VALUE IS CONSISTENT WITH PREVIOUSLY REPORTED AND CALLED VALUE.    Culture   Final    STAPHYLOCOCCUS AUREUS IN BOTH AEROBIC AND ANAEROBIC BOTTLES    Report Status 06/05/2015 FINAL  Final   Organism ID, Bacteria STAPHYLOCOCCUS AUREUS  Final      Susceptibility   Staphylococcus aureus - MIC*    CIPROFLOXACIN >=8 RESISTANT Resistant     ERYTHROMYCIN 4 RESISTANT Resistant     GENTAMICIN <=0.5 SENSITIVE Sensitive     OXACILLIN <=0.25 SENSITIVE Sensitive     TRIMETH/SULFA <=10 SENSITIVE Sensitive     CLINDAMYCIN <=0.25 RESISTANT Resistant     CEFOXITIN SCREEN NEGATIVE Sensitive     Inducible Clindamycin Value in next row Resistant      POSITIVEINDUCIBLE CLINDAMYCIN RESISTANCE - A positive ICR test is indicative of inducible resistance to macrolides, lincosamides, and type B streptogramin.  This isolate is presumed to be resistant to Clindamycin, however, Clindamycin may still be effective  in some patients.     TETRACYCLINE Value in next row Sensitive      SENSITIVE<=1    * STAPHYLOCOCCUS AUREUS  Culture, blood (routine x 2)     Status: None (Preliminary result)   Collection Time: 06/03/15  3:34 PM  Result Value Ref Range Status   Specimen Description BLOOD LEFT ARM  Final   Special Requests BOTTLES DRAWN AEROBIC AND ANAEROBIC 4CC  Final   Culture NO GROWTH 2 DAYS  Final   Report Status PENDING  Incomplete  Culture, blood (routine x 2)     Status: None (Preliminary result)   Collection Time: 06/03/15  3:46 PM  Result Value Ref Range Status   Specimen Description BLOOD LEFT ARM  Final   Special Requests BOTTLES DRAWN AEROBIC  AND ANAEROBIC 4CC  Final   Culture NO GROWTH 2 DAYS  Final   Report Status PENDING  Incomplete    Allergies:  Allergies  Allergen Reactions  . Sulfa Antibiotics Anaphylaxis  . Penicillins Rash and Other (See Comments)    Has patient had a PCN reaction causing immediate rash, facial/tongue/throat swelling, SOB or lightheadedness with hypotension: No Has patient had a PCN reaction causing severe rash involving mucus membranes or skin necrosis: No Has patient had a PCN reaction that required hospitalization No Has patient had a PCN reaction occurring within the last 10 years: Yes If all of the above answers are "NO", then may proceed with Cephalosporin use.     Discharge antibiotics Cefazolin     2  grams every  8 hours PICC Care per protocol  Labs weekly while on IV antibiotics     FAX weekly labs to [phone] CBC w diff   Comprehensive met panel  Planned duration of antibiotics- 2 weeks FU [facility] 11/16 neg , TTE negative, but poor study.  No TEE done  Stop date 12/1 Follow up clinic date TBD   [facility]   Leonel Ramsay, MD

## 2015-06-05 NOTE — Discharge Summary (Signed)
[facility] NAME: Sharon Kirby    MR#:  [id number]  DATE OF BIRTH:  08-Jul-1951  DATE OF ADMISSION:  06/02/2015 ADMITTING PHYSICIAN: Nicholes Mango, MD  DATE OF DISCHARGE: 06/05/2015   PRIMARY CARE PHYSICIAN: Madelyn Brunner, MD    ADMISSION DIAGNOSIS:  Right knee pain [M25.561] Bilateral low back pain without sciatica [M54.5]  DISCHARGE DIAGNOSIS:  Active Problems:   Hyponatremia  MSSA bacteremia  SECONDARY DIAGNOSIS:   Past Medical History  Diagnosis Date  . Seizures ([place])   . Stroke ([place])   . Brain tumor ([place])   . Diabetes mellitus without complication ([place])   . Arthritis   . Sleep apnea   . GERD (gastroesophageal reflux disease)   . Asthma   . Long QT interval   . Hypertension   . Panic attacks   . Anxiety   . Depression   . Syncope    HOSPITAL COURSE:  64 y.o. female with a known history of diabetic mellitus, hypertension, anxiety and depression and multiple other medical problems is admitted after she sustained a fall and having acute on chronic low back pain.  She underwent lumbar spine x-ray which did not show any acute fracture.  Also complaining of knee pain and x-rays were negative for that also.  Patient's blood culture (4/4 bottles) came back positive for MSSA, she underwent 2-D echo which did not show any obvious vegetations. The source was thought to be from her skin breakdown in the groin per  Infectious disease.  After discussion with ID, it was determined that she will need 2 weeks of IV antibiotics through PICC line.  Due to Recurrent fall Physical therapy was consulted who recommended short-term rehabilitation/skilled nursing facility. She is getting PICC line and after that she will be discharged to [facility] care.  Patient and family are in agreement with her discharge planning. DISCHARGE CONDITIONS:  stable CONSULTS OBTAINED:  Treatment Team:  Adrian Prows, MD Earnestine Leys,  MD Gonzella Lex, MD DRUG ALLERGIES:   Allergies  Allergen Reactions  . Sulfa Antibiotics Anaphylaxis  . Penicillins Rash and Other (See Comments)    Has patient had a PCN reaction causing immediate rash, facial/tongue/throat swelling, SOB or lightheadedness with hypotension: No Has patient had a PCN reaction causing severe rash involving mucus membranes or skin necrosis: No Has patient had a PCN reaction that required hospitalization No Has patient had a PCN reaction occurring within the last 10 years: Yes If all of the above answers are "NO", then may proceed with Cephalosporin use.    DISCHARGE MEDICATIONS:   Current Discharge Medication List    START taking these medications   Details  ceFAZolin 2 g in dextrose 5 % 50 mL ivpb Inject 2 g into the vein every 8 (eight) hours. Qty: 100 mL, Refills: 0      CONTINUE these medications which have CHANGED   Details  HYDROcodone-acetaminophen (NORCO/VICODIN) 5-325 MG tablet Take 1 tablet by mouth every 6 (six) hours as needed for moderate pain. Qty: 5 tablet, Refills: 0    oxyCODONE-acetaminophen (ROXICET) 5-325 MG tablet Take 1 tablet by mouth every 6 (six) hours as needed for severe pain. Qty: 6 tablet, Refills: 0      CONTINUE these medications which have NOT CHANGED   Details  aspirin EC 81 MG tablet Take 81 mg by mouth daily.    Calcium Carb-Cholecalciferol (CALCIUM 600+D) 600-800 MG-UNIT TABS Take 1 tablet by mouth  daily.    cetirizine (ZYRTEC) 10 MG tablet Take 10 mg by mouth daily.    Cholecalciferol (VITAMIN D) 2000 UNITS CAPS Take 2,000 Units by mouth daily.    clonazePAM (KLONOPIN) 1 MG tablet Take 1 mg by mouth 2 (two) times daily.    dimenhyDRINATE (DRAMAMINE) 50 MG tablet Take 50 mg by mouth every 8 (eight) hours as needed for nausea.    docusate sodium (COLACE) 100 MG capsule Take 100 mg by mouth 2 (two) times daily as needed for mild constipation.    ferrous sulfate 325 (65 FE) MG tablet Take 1 tablet (325  mg total) by mouth 2 (two) times daily with a meal. Qty: 120 tablet, Refills: 0    glipiZIDE (GLUCOTROL XL) 10 MG 24 hr tablet Take 10 mg by mouth daily.    hydrOXYzine (ATARAX/VISTARIL) 25 MG tablet Take 25 mg by mouth 3 (three) times daily as needed for itching.    ibuprofen (ADVIL,MOTRIN) 800 MG tablet Take 800 mg by mouth every 8 (eight) hours as needed for mild pain.    lisinopril (PRINIVIL,ZESTRIL) 10 MG tablet Take 10 mg by mouth daily.    metFORMIN (GLUCOPHAGE) 1000 MG tablet Take 1,000 mg by mouth 2 (two) times daily with a meal.    methocarbamol (ROBAXIN) 500 MG tablet Take 500 mg by mouth every 6 (six) hours as needed for muscle spasms.    Multiple Vitamin (MULTIVITAMIN WITH MINERALS) TABS tablet Take 1 tablet by mouth daily.    omeprazole (PRILOSEC) 20 MG capsule Take 20 mg by mouth daily.    PARoxetine (PAXIL) 40 MG tablet Take 40 mg by mouth daily.    Potassium Gluconate 550 MG TABS Take 550 mg by mouth daily.    Turmeric Curcumin 500 MG CAPS Take 500 mg by mouth daily.    zinc gluconate 50 MG tablet Take 50 mg by mouth daily.      STOP taking these medications     traMADol (ULTRAM) 50 MG tablet        DISCHARGE INSTRUCTIONS:    DIET:  Regular diet  DISCHARGE CONDITION:  Stable  ACTIVITY:  Activity as tolerated  OXYGEN:  Home Oxygen: No.   Oxygen Delivery: room air  DISCHARGE LOCATION:  nursing home   If you experience worsening of your admission symptoms, develop shortness of breath, life threatening emergency, suicidal or homicidal thoughts you must seek medical attention immediately by calling 911 or calling your MD immediately  if symptoms less severe.  You Must read complete instructions/literature along with all the possible adverse reactions/side effects for all the Medicines you take and that have been prescribed to you. Take any new Medicines after you have completely understood and accpet all the possible adverse reactions/side effects.    Please note  You were cared for by a hospitalist during your hospital stay. If you have any questions about your discharge medications or the care you received while you were in the hospital after you are discharged, you can call the unit and asked to speak with the hospitalist on call if the hospitalist that took care of you is not available. Once you are discharged, your primary care physician will handle any further medical issues. Please note that NO REFILLS for any discharge medications will be authorized once you are discharged, as it is imperative that you return to your primary care physician (or establish a relationship with a primary care physician if you do not have one) for your aftercare needs so that  they can reassess your need for medications and monitor your lab values.    On the day of Discharge:  VITAL SIGNS:  Blood pressure 133/65, pulse 96, temperature 98.5 F (36.9 C), temperature source Oral, resp. rate 20, height 5' (1.524 m), weight 143.246 kg (315 lb 12.8 oz), SpO2 93 %. PHYSICAL EXAMINATION:  GENERAL:  64 y.o.-year-old patient lying in the bed with no acute distress.  EYES: Pupils equal, round, reactive to light and accommodation. No scleral icterus. Extraocular muscles intact.  HEENT: Head atraumatic, normocephalic. Oropharynx and nasopharynx clear.  NECK:  Supple, no jugular venous distention. No thyroid enlargement, no tenderness.  LUNGS: Normal breath sounds bilaterally, no wheezing, rales,rhonchi or crepitation. No use of accessory muscles of respiration.  CARDIOVASCULAR: S1, S2 normal. No murmurs, rubs, or gallops.  ABDOMEN: Soft, non-tender, non-distended. Bowel sounds present. No organomegaly or mass.  EXTREMITIES: No pedal edema, cyanosis, or clubbing.  NEUROLOGIC: Cranial nerves II through XII are intact. Muscle strength 5/5 in all extremities. Sensation intact. Gait not checked.  PSYCHIATRIC: The patient is alert and oriented x 3. Depressed SKIN: No  obvious rash, lesion, or ulcer.   DATA REVIEW:   CBC  Recent Labs Lab 06/04/15 0524  WBC 10.7  HGB 10.3*  HCT 31.0*  PLT 110*    Chemistries   Recent Labs Lab 06/04/15 0524  NA 130*  K 4.4  CL 100*  CO2 22  GLUCOSE 141*  BUN 28*  CREATININE 0.64  CALCIUM 9.1    Cardiac Enzymes  Recent Labs Lab 06/03/15 0459  TROPONINI <0.03    Microbiology Results: Culture from 11/15 grew MSSA bacteremia (all 4 out of 4 bottles) and repeat culture from 11/16 are negative thus far   RADIOLOGY:  No results found.   Management plans discussed with the patient, family and they are in agreement.  CODE STATUS: Full code  She remains at very high risk for readmissions and I have consulted [facility] care management to see if we can avoid unnecessary readmissions.  TOTAL TIME TAKING CARE OF THIS PATIENT: 55 minutes.    [facility], Casin Federici M.D on 06/05/2015 at 8:28 AM  Between 7am to 6pm - Pager - [phone]  After 6pm go to [email or web address] - password [email or web address] [facility]  Office  [phone]  CC: Primary care physician; Madelyn Brunner, MD Adrian Prows MD Jennings Books MD Milagros Evener MD

## 2015-06-05 NOTE — Consult Note (Addendum)
   [facility] CM Inpatient Consult   06/05/2015  Sharon Kirby 27-Jun-1951 [id number] Referral received for community [facility] care management.  However, chart review reveals that the patient is currently being discharged to a skilled nursing facility today. Will follow up with [facility] for additional referral information.  For questions, please contact: Natividad Brood, [facility] Liaison  [phone] business mobile phone   1535: Spoke with Clayborne Artist, RN [place] regarding referring the patient for care management services when and if returning to the community for [facility] services. Natividad Brood, RN BSN [facility] Liaison  [phone] business mobile phone

## 2015-06-05 NOTE — Plan of Care (Addendum)
Pt who was admitted for fall on 11/15 was d/ced to [facility].  Xray for lumbar spine and knee negative for fracture.  Positive for MSSA - source skin breakdown in groin area.  Barrier cream used and pink foam placed on open areas and on sacrum which was prophylactic.  Breakdown is moisture related skin damage.  PICC line placed today for 2wks worth of IV abx.  Pt having poor PO intake causing low blood sugars.  Gave juice, chocolate milk and ice cream - had to give 1/2 amp of D50 before d/c and blood sugar came up to 104.  Removed peripheral IV. Called report to [employer].  Pt going to Rm [facility].  Pt transported via EMS.  Son, [name] at bedside and is accompanying her.

## 2015-06-05 NOTE — Plan of Care (Signed)
Problem: Health Behavior/Discharge Planning: Goal: Ability to manage health-related needs will improve Outcome: Progressing Pt is alert, disoriented to time. Remains afebrile.   Problem: Pain Managment: Goal: General experience of comfort will improve Outcome: Progressing Pt is crying out in pain, PO Percocet and IV dilaudid given with relief.   Problem: Skin Integrity: Goal: Risk for impaired skin integrity will decrease Outcome: Progressing Pt remains incontinent at this time.   Problem: Activity: Goal: Risk for activity intolerance will decrease Outcome: Progressing Pt remains bedbound, every movement is painful for patient.   Problem: Fluid Volume: Goal: Ability to maintain a balanced intake and output will improve Outcome: Progressing PO fluids encouraged at this time.

## 2015-06-05 NOTE — Discharge Instructions (Signed)
Bacteremia °Bacteremia is the presence of bacteria in the blood. A small amount of bacteria may not cause any symptoms. °Sometimes, the bacteria spread and cause infection in other parts of the body, such as the heart, joints, bones, or brain. Having a great amount of bacteria can cause a serious, sometimes life-threatening infection called sepsis. °CAUSES °This condition is caused by bacteria that get into the blood. Bacteria can enter the blood: °· During a dental or medical procedure. °· After you brush your teeth so hard that the gums bleed. °· Through a scrape or cut on your skin. °More severe types of bacteremia can be caused by: °· A bacterial infection, such as pneumonia, that spreads to the blood. °· Using a dirty needle. °RISK FACTORS °This condition is more likely to develop in: °· Children and elderly adults. °· People who have a long-lasting (chronic) disease or medical condition. °· People who have an artificial joint or heart valve. °· People who have heart valve disease. °· People who have a tube, such as a catheter or IV tube, that has been inserted for a medical treatment. °· People who have a weak body defense system (immune system). °· People who use IV drugs. °SYMPTOMS °Usually, this condition does not cause symptoms when it is mild. When it is more serious, it may cause: °· Fever. °· Chills. °· Racing heart. °· Shortness of breath. °· Dizziness. °· Weakness. °· Confusion. °· Nausea or vomiting. °· Diarrhea. °Bacteremia that has spread to other parts of the body may cause symptoms in those areas. °DIAGNOSIS °This condition may be diagnosed with a physical exam and tests, such as: °· A complete blood count (CBC). This test looks for signs of infection. °· Blood cultures. These look for bacteria in your blood. °· Tests of any IV tubes. These look for a source of infection. °· Urine tests. °· Imaging tests, such as an X-ray, CT scan, MRI, or heart ultrasound. °TREATMENT °If the condition is mild,  treatment is usually not needed. Usually, the body's immune system will remove the bacteria. If the condition is more serious, it may be treated with: °· Antibiotic medicines through an IV tube. These may be given for about 2 weeks. At first, the antibiotic that is given may kill most types of blood bacteria. If your test results show that a certain kind of bacteria is causing problems, the antibiotic may be changed to kill only the bacteria that are causing problems. °· Antibiotics taken by mouth. °· Removing any catheter or IV tube that is a source of infection. °· Blood pressure and breathing support, if needed. °· Surgery to control the source or spread of infection, if needed. °HOME CARE INSTRUCTIONS °· Take over-the-counter and prescription medicines only as told by your health care provider. °· If you were prescribed an antibiotic, take it as told by your health care provider. Do not stop taking the antibiotic even if you start to feel better. °· Rest at home until your condition is under control. °· Drink enough fluid to keep your urine clear or pale yellow. °· Keep all follow-up visits as told by your health care provider. This is important. °PREVENTION °Take these actions to help prevent future episodes of bacteremia: °· Get all vaccinations as recommended by your health care provider. °· Clean and cover scrapes or cuts. °· Bathe regularly. °· Wash your hands often. °· Before any dental or surgical procedure, ask your health care provider if you should take an antibiotic. °SEEK MEDICAL   CARE IF: °· Your symptoms get worse. °· You continue to have symptoms after treatment. °· You develop new symptoms after treatment. °SEEK IMMEDIATE MEDICAL CARE IF: °· You have chest pain or trouble breathing. °· You develop confusion, dizziness, or weakness. °· You develop pale skin. °  °This information is not intended to replace advice given to you by your health care provider. Make sure you discuss any questions you have  with your health care provider. °  °Document Released: 04/17/2006 Document Revised: 03/25/2015 Document Reviewed: 09/06/2014 °[employer]vier Interactive Patient Education ©2016 [employer] ° °

## 2015-06-05 NOTE — Progress Notes (Signed)
[place] INFECTIOUS DISEASE PROGRESS NOTE Date of Admission:  06/02/2015     ID: Sharon Kirby is a 64 y.o. female with MSSA bacteremia from thigh cellulitis  Active Problems:   Hyponatremia   Subjective: CO pain all over. No fever. picc placed  ROS  Eleven systems are reviewed and negative except per hpi  Medications:  Antibiotics Given (last 72 hours)    Date/Time Action Medication Dose Rate   06/03/15 1426 Given   vancomycin (VANCOCIN) 1,500 mg in sodium chloride 0.9 % 500 mL IVPB 1,500 mg 250 mL/hr   06/03/15 1427 Given   vancomycin (VANCOCIN) 1,500 mg in sodium chloride 0.9 % 500 mL IVPB 1,500 mg 250 mL/hr   06/03/15 1432 Given   vancomycin (VANCOCIN) 1,500 mg in sodium chloride 0.9 % 500 mL IVPB 1,500 mg 250 mL/hr   06/03/15 2211 Given   vancomycin (VANCOCIN) 1,500 mg in sodium chloride 0.9 % 500 mL IVPB 1,500 mg 250 mL/hr   06/04/15 1138 Given   vancomycin (VANCOCIN) 1,500 mg in sodium chloride 0.9 % 500 mL IVPB 1,500 mg 250 mL/hr   06/04/15 2352 Given  [loss of IV access]   vancomycin (VANCOCIN) 1,500 mg in sodium chloride 0.9 % 500 mL IVPB 1,500 mg 250 mL/hr   06/05/15 1045 Given   ceFAZolin (ANCEF) IVPB 2 g/50 mL premix 2 g 100 mL/hr     . aspirin EC  81 mg Oral Daily  . bisacodyl  10 mg Rectal Daily  . calcium-vitamin D  1 tablet Oral Daily  . cholecalciferol  2,000 Units Oral Daily  . clonazePAM  1 mg Oral BID  . enoxaparin (LOVENOX) injection  40 mg Subcutaneous BID  . ferrous sulfate  325 mg Oral BID WC  . glipiZIDE  10 mg Oral Daily  . insulin aspart  0-15 Units Subcutaneous TID WC  . insulin aspart  0-5 Units Subcutaneous QHS  . lisinopril  10 mg Oral Daily  . loratadine  10 mg Oral Daily  . metFORMIN  1,000 mg Oral BID WC  . metoprolol tartrate  12.5 mg Oral BID  . multivitamin with minerals  1 tablet Oral Daily  . pantoprazole  40 mg Oral Daily  . PARoxetine  40 mg Oral Daily  . polyethylene glycol  17 g Oral Daily  . pravastatin  10 mg  Oral q1800  . senna-docusate  2 tablet Oral BID  . sodium chloride  3 mL Intravenous Q12H  . sodium phosphate  1 enema Rectal STAT  . sodium phosphate  1 enema Rectal STAT  . zinc sulfate  220 mg Oral Daily    Objective: Vital signs in last 24 hours: Temp:  [98.5 F (36.9 C)-99.3 F (37.4 C)] 99.2 F (37.3 C) (11/18 1420) Pulse Rate:  [92-100] 100 (11/18 1420) Resp:  [16-20] 18 (11/18 1420) BP: (133-153)/(65-105) 153/105 mmHg (11/18 1420) SpO2:  [93 %-94 %] 94 % (11/18 1420) Constitutional: morbidly obese, lying in bed, sleepy but arousable HENT: /AT, PERRLA, no scleral icterus Mouth/Throat: Oropharynx is clear and dry . No oropharyngeal exudate.  Cardiovascular: Normal rate, regular rhythm and normal heart sounds.  Pulmonary/Chest: Effort normal and breath sounds normal. No respiratory distress. has no wheezes.  Neck supple, no nuchal rigidity Abdominal: obese Soft. Bowel sounds are normal. exhibits no distension. There is no tenderness.  Lymphadenopathy: no cervical adenopathy. No axillary adenopathy Neurological: alert and interactive Skin: brusiing on arms, legs Has skin breakdown in groin and upper thighs Psychiatric: tearful. Odd  affect  Lab Results  Recent Labs  06/03/15 0459 06/04/15 0524  WBC 10.6 10.7  HGB 10.3* 10.3*  HCT 31.4* 31.0*  NA 130* 130*  K 4.2 4.4  CL 100* 100*  CO2 23 22  BUN 22* 28*  CREATININE 0.66 0.64    Microbiology: Results for orders placed or performed during the hospital encounter of 06/02/15  Urine culture     Status: None   Collection Time: 06/02/15  6:38 PM  Result Value Ref Range Status   Specimen Description URINE, RANDOM  Final   Special Requests NONE  Final   Culture 30,000 COLONIES/mL STAPHYLOCOCCUS AUREUS  Final   Report Status 06/05/2015 FINAL  Final   Organism ID, Bacteria STAPHYLOCOCCUS AUREUS  Final      Susceptibility   Staphylococcus aureus - MIC*    CIPROFLOXACIN >=8 RESISTANT Resistant      ERYTHROMYCIN 4 RESISTANT Resistant     GENTAMICIN <=0.5 SENSITIVE Sensitive     OXACILLIN 1 SENSITIVE Sensitive     TRIMETH/SULFA <=10 SENSITIVE Sensitive     CLINDAMYCIN <=0.25 RESISTANT Resistant     CEFOXITIN SCREEN NEGATIVE Sensitive     Inducible Clindamycin Value in next row Resistant      POSITIVEINDUCIBLE CLINDAMYCIN RESISTANCE - A positive ICR test is indicative of inducible resistance to macrolides, lincosamides, and type B streptogramin.  This isolate is presumed to be resistant to Clindamycin, however, Clindamycin may still be effective in some patients.     TETRACYCLINE Value in next row Sensitive      SENSITIVE<=1    * 30,000 COLONIES/mL STAPHYLOCOCCUS AUREUS  Blood culture (routine x 2)     Status: None   Collection Time: 06/02/15  6:39 PM  Result Value Ref Range Status   Specimen Description BLOOD RIGHT ARM  Final   Special Requests BOTTLES DRAWN AEROBIC AND ANAEROBIC 4CC  Final   Culture  Setup Time   Final    GRAM POSITIVE COCCI IN CLUSTERS IN BOTH AEROBIC AND ANAEROBIC BOTTLES CRITICAL RESULT CALLED TO, READ BACK BY AND VERIFIED WITH: DONEESHA ROBERTSON [id number] ON 06/03/15 CTJ    Culture   Final    STAPHYLOCOCCUS AUREUS IN BOTH AEROBIC AND ANAEROBIC BOTTLES    Report Status 06/05/2015 FINAL  Final   Organism ID, Bacteria STAPHYLOCOCCUS AUREUS  Final      Susceptibility   Staphylococcus aureus - MIC*    CIPROFLOXACIN >=8 RESISTANT Resistant     ERYTHROMYCIN 4 RESISTANT Resistant     GENTAMICIN <=0.5 SENSITIVE Sensitive     OXACILLIN <=0.25 SENSITIVE Sensitive     TRIMETH/SULFA <=10 SENSITIVE Sensitive     CLINDAMYCIN <=0.25 RESISTANT Resistant     CEFOXITIN SCREEN NEGATIVE Sensitive     Inducible Clindamycin Value in next row Resistant      POSITIVEINDUCIBLE CLINDAMYCIN RESISTANCE - A positive ICR test is indicative of inducible resistance to macrolides, lincosamides, and type B streptogramin.  This isolate is presumed to be resistant to Clindamycin, however,  Clindamycin may still be effective in some patients.     TETRACYCLINE Value in next row Sensitive      SENSITIVE<=1    * STAPHYLOCOCCUS AUREUS  Blood culture (routine x 2)     Status: None   Collection Time: 06/02/15  6:39 PM  Result Value Ref Range Status   Specimen Description BLOOD RIGHT ASSIST CONTROL  Final   Special Requests BOTTLES DRAWN AEROBIC AND ANAEROBIC 4CC  Final   Culture  Setup Time   Final  GRAM POSITIVE COCCI IN CLUSTERS IN BOTH AEROBIC AND ANAEROBIC BOTTLES CRITICAL VALUE NOTED.  VALUE IS CONSISTENT WITH PREVIOUSLY REPORTED AND CALLED VALUE.    Culture   Final    STAPHYLOCOCCUS AUREUS IN BOTH AEROBIC AND ANAEROBIC BOTTLES    Report Status 06/05/2015 FINAL  Final   Organism ID, Bacteria STAPHYLOCOCCUS AUREUS  Final      Susceptibility   Staphylococcus aureus - MIC*    CIPROFLOXACIN >=8 RESISTANT Resistant     ERYTHROMYCIN 4 RESISTANT Resistant     GENTAMICIN <=0.5 SENSITIVE Sensitive     OXACILLIN <=0.25 SENSITIVE Sensitive     TRIMETH/SULFA <=10 SENSITIVE Sensitive     CLINDAMYCIN <=0.25 RESISTANT Resistant     CEFOXITIN SCREEN NEGATIVE Sensitive     Inducible Clindamycin Value in next row Resistant      POSITIVEINDUCIBLE CLINDAMYCIN RESISTANCE - A positive ICR test is indicative of inducible resistance to macrolides, lincosamides, and type B streptogramin.  This isolate is presumed to be resistant to Clindamycin, however, Clindamycin may still be effective in some patients.     TETRACYCLINE Value in next row Sensitive      SENSITIVE<=1    * STAPHYLOCOCCUS AUREUS  Culture, blood (routine x 2)     Status: None (Preliminary result)   Collection Time: 06/03/15  3:34 PM  Result Value Ref Range Status   Specimen Description BLOOD LEFT ARM  Final   Special Requests BOTTLES DRAWN AEROBIC AND ANAEROBIC 4CC  Final   Culture NO GROWTH 2 DAYS  Final   Report Status PENDING  Incomplete  Culture, blood (routine x 2)     Status: None (Preliminary result)   Collection  Time: 06/03/15  3:46 PM  Result Value Ref Range Status   Specimen Description BLOOD LEFT ARM  Final   Special Requests BOTTLES DRAWN AEROBIC AND ANAEROBIC 4CC  Final   Culture NO GROWTH 2 DAYS  Final   Report Status PENDING  Incomplete    Studies/Results: Dg Chest 1 View  06/05/2015  CLINICAL DATA:  Right-sided PICC placement EXAM: CHEST 1 VIEW COMPARISON:  06/02/2015 FINDINGS: Right PICC tip projects in the lower superior vena cava, well positioned. Lung volumes remain low. There is mild basilar atelectasis. No convincing pneumonia or edema. No pleural effusion or pneumothorax. Cardiac silhouette is mildly enlarged. IMPRESSION: 1. Right PICC is well positioned with its tip in the lower superior vena cava. 2. No acute findings in the lungs. No other change from the prior study. Electronically Signed   By: Lajean Manes M.D.   On: 06/05/2015 10:49    Assessment/Plan: Sharon Kirby is a 64 y.o. female with morbid obesity admitted with back pain and falling with fever after admission and bcx mssa. I suspect this is staph infection from her skin breakdown in groin. She seems to have cirrhosis with ascites and splenomegaly on Ct scan chest - son said he did not hear of this nor was she ever a drinker. Her mother did die of liver failure though.  Currently improving. No fevers, PICC Placed. FU BCX neg. Echo neg TTE  Recommendations Bacteremia- MSSA Ancef x 2 weeks - see abx order form I can see at that time in clinic - may need to extend (may need to check MRI if back pain persists) She also had MSSA in urine which can suggest endocarditis but given comorbidities she is a poor TEE candidate  ? Cirrhosis- Neg  hep serologies  -I suspect fatty liver Will need otpt GI fu  Thank you very much for the consult. Will follow with you.  [place], [place]   06/05/2015, 3:49 PM

## 2015-06-05 NOTE — Clinical Social Work Placement (Signed)
   CLINICAL SOCIAL WORK PLACEMENT  NOTE  Date:  06/05/2015  Patient Details  Name: Sharon Kirby MRN: [id number] Date of Birth: 1950-08-27  Clinical Social Work is seeking post-discharge placement for this patient at the [place] level of care (*CSW will initial, date and re-position this form in  chart as items are completed):  Yes   Patient/family provided with [facility] Work Department's list of facilities offering this level of care within the geographic area requested by the patient (or if unable, by the patient's family).  Yes   Patient/family informed of their freedom to choose among providers that offer the needed level of care, that participate in Medicare, Medicaid or managed care program needed by the patient, have an available bed and are willing to accept the patient.  Yes   Patient/family informed of [facility]'s ownership interest in [facility] and [facility], as well as of the fact that they are under no obligation to receive care at these facilities.  PASRR submitted to EDS on 06/02/15     PASRR number received on 06/02/15     Existing PASRR number confirmed on       FL2 transmitted to all facilities in geographic area requested by pt/family on       FL2 transmitted to all facilities within larger geographic area on       Patient informed that his/her managed care company has contracts with or will negotiate with certain facilities, including the following:        Yes   Patient/family informed of bed offers received.  Patient chooses bed at [facility]     Physician recommends and patient chooses bed at  [facility])    Patient to be transferred to [facility] on 06/05/15.  Patient to be transferred to facility by       Patient family notified on 06/05/15 of transfer.  Name of family member notified:  son     PHYSICIAN       Additional Comment:     _______________________________________________ Darden Dates, LCSW 06/05/2015, 1:02 PM

## 2015-06-05 NOTE — Care Management (Signed)
Patient to discharge today to [facility].  PICC line in place for IV antibiotics.  CSW facilitating transfer.  RNCM singing off.

## 2015-06-06 ENCOUNTER — Emergency Department: Payer: Commercial Managed Care - HMO

## 2015-06-06 ENCOUNTER — Inpatient Hospital Stay
Admission: EM | Admit: 2015-06-06 | Discharge: 2015-06-15 | DRG: 871 | Disposition: A | Discharge status: Skilled Nursing Facility | Payer: Commercial Managed Care - HMO | Attending: Internal Medicine | Admitting: Internal Medicine

## 2015-06-06 DIAGNOSIS — R740 Nonspecific elevation of levels of transaminase and lactic acid dehydrogenase [LDH]: Secondary | ICD-10-CM

## 2015-06-06 DIAGNOSIS — E119 Type 2 diabetes mellitus without complications: Secondary | ICD-10-CM | POA: Diagnosis present

## 2015-06-06 DIAGNOSIS — Z882 Allergy status to sulfonamides status: Secondary | ICD-10-CM | POA: Diagnosis not present

## 2015-06-06 DIAGNOSIS — E871 Hypo-osmolality and hyponatremia: Secondary | ICD-10-CM | POA: Diagnosis present

## 2015-06-06 DIAGNOSIS — R197 Diarrhea, unspecified: Secondary | ICD-10-CM | POA: Diagnosis present

## 2015-06-06 DIAGNOSIS — Z9119 Patient's noncompliance with other medical treatment and regimen: Secondary | ICD-10-CM

## 2015-06-06 DIAGNOSIS — Z880 Allergy status to penicillin: Secondary | ICD-10-CM

## 2015-06-06 DIAGNOSIS — I10 Essential (primary) hypertension: Secondary | ICD-10-CM | POA: Diagnosis present

## 2015-06-06 DIAGNOSIS — R410 Disorientation, unspecified: Secondary | ICD-10-CM

## 2015-06-06 DIAGNOSIS — Z8673 Personal history of transient ischemic attack (TIA), and cerebral infarction without residual deficits: Secondary | ICD-10-CM | POA: Diagnosis not present

## 2015-06-06 DIAGNOSIS — F329 Major depressive disorder, single episode, unspecified: Secondary | ICD-10-CM | POA: Diagnosis present

## 2015-06-06 DIAGNOSIS — E872 Acidosis: Secondary | ICD-10-CM | POA: Diagnosis present

## 2015-06-06 DIAGNOSIS — F410 Panic disorder [episodic paroxysmal anxiety] without agoraphobia: Secondary | ICD-10-CM | POA: Diagnosis present

## 2015-06-06 DIAGNOSIS — Z95828 Presence of other vascular implants and grafts: Secondary | ICD-10-CM

## 2015-06-06 DIAGNOSIS — G9341 Metabolic encephalopathy: Secondary | ICD-10-CM | POA: Diagnosis present

## 2015-06-06 DIAGNOSIS — M549 Dorsalgia, unspecified: Secondary | ICD-10-CM

## 2015-06-06 DIAGNOSIS — Z9049 Acquired absence of other specified parts of digestive tract: Secondary | ICD-10-CM

## 2015-06-06 DIAGNOSIS — K219 Gastro-esophageal reflux disease without esophagitis: Secondary | ICD-10-CM | POA: Diagnosis present

## 2015-06-06 DIAGNOSIS — J45909 Unspecified asthma, uncomplicated: Secondary | ICD-10-CM | POA: Diagnosis present

## 2015-06-06 DIAGNOSIS — A409 Streptococcal sepsis, unspecified: Secondary | ICD-10-CM | POA: Diagnosis not present

## 2015-06-06 DIAGNOSIS — E860 Dehydration: Secondary | ICD-10-CM | POA: Diagnosis present

## 2015-06-06 DIAGNOSIS — Z7982 Long term (current) use of aspirin: Secondary | ICD-10-CM | POA: Diagnosis not present

## 2015-06-06 DIAGNOSIS — R7989 Other specified abnormal findings of blood chemistry: Secondary | ICD-10-CM | POA: Diagnosis present

## 2015-06-06 DIAGNOSIS — K7460 Unspecified cirrhosis of liver: Secondary | ICD-10-CM | POA: Diagnosis present

## 2015-06-06 DIAGNOSIS — Z9889 Other specified postprocedural states: Secondary | ICD-10-CM | POA: Diagnosis not present

## 2015-06-06 DIAGNOSIS — G4733 Obstructive sleep apnea (adult) (pediatric): Secondary | ICD-10-CM | POA: Diagnosis present

## 2015-06-06 DIAGNOSIS — A419 Sepsis, unspecified organism: Secondary | ICD-10-CM

## 2015-06-06 DIAGNOSIS — N179 Acute kidney failure, unspecified: Secondary | ICD-10-CM | POA: Diagnosis present

## 2015-06-06 DIAGNOSIS — K7290 Hepatic failure, unspecified without coma: Secondary | ICD-10-CM | POA: Diagnosis present

## 2015-06-06 DIAGNOSIS — Z79899 Other long term (current) drug therapy: Secondary | ICD-10-CM | POA: Diagnosis not present

## 2015-06-06 DIAGNOSIS — M1990 Unspecified osteoarthritis, unspecified site: Secondary | ICD-10-CM | POA: Diagnosis present

## 2015-06-06 DIAGNOSIS — R7401 Elevation of levels of liver transaminase levels: Secondary | ICD-10-CM

## 2015-06-06 DIAGNOSIS — A4101 Sepsis due to Methicillin susceptible Staphylococcus aureus: Secondary | ICD-10-CM | POA: Diagnosis present

## 2015-06-06 DIAGNOSIS — I330 Acute and subacute infective endocarditis: Secondary | ICD-10-CM | POA: Diagnosis present

## 2015-06-06 DIAGNOSIS — G894 Chronic pain syndrome: Secondary | ICD-10-CM | POA: Diagnosis present

## 2015-06-06 DIAGNOSIS — L8990 Pressure ulcer of unspecified site, unspecified stage: Secondary | ICD-10-CM | POA: Diagnosis present

## 2015-06-06 DIAGNOSIS — Z6841 Body Mass Index (BMI) 40.0 and over, adult: Secondary | ICD-10-CM | POA: Diagnosis not present

## 2015-06-06 LAB — GLUCOSE, CAPILLARY
Glucose-Capillary: 110 mg/dL — ABNORMAL HIGH (ref 65–99)
Glucose-Capillary: 114 mg/dL — ABNORMAL HIGH (ref 65–99)

## 2015-06-06 LAB — CBC WITH DIFFERENTIAL/PLATELET
BAND NEUTROPHILS: 2 %
BASOS PCT: 1 %
BLASTS: 1 %
Basophils Absolute: 0.2 10*3/uL — ABNORMAL HIGH (ref 0–0.1)
EOS ABS: 0 10*3/uL (ref 0–0.7)
EOS PCT: 0 %
HCT: 32.8 % — ABNORMAL LOW (ref 35.0–47.0)
Hemoglobin: 10.7 g/dL — ABNORMAL LOW (ref 12.0–16.0)
LYMPHS ABS: 1.3 10*3/uL (ref 1.0–3.6)
LYMPHS PCT: 8 %
MCH: 28.4 pg (ref 26.0–34.0)
MCHC: 32.6 g/dL (ref 32.0–36.0)
MCV: 87.2 fL (ref 80.0–100.0)
MONO ABS: 1.5 10*3/uL — AB (ref 0.2–0.9)
MONOS PCT: 9 %
Metamyelocytes Relative: 0 %
Myelocytes: 1 %
NEUTROS ABS: 13 10*3/uL — AB (ref 1.4–6.5)
Neutrophils Relative %: 77 %
OTHER: 1 %
PLATELETS: 146 10*3/uL — AB (ref 150–440)
Promyelocytes Absolute: 0 %
RBC: 3.76 MIL/uL — ABNORMAL LOW (ref 3.80–5.20)
RDW: 26.6 % — AB (ref 11.5–14.5)
WBC: 16.2 10*3/uL — ABNORMAL HIGH (ref 3.6–11.0)
nRBC: 1 /100 WBC — ABNORMAL HIGH

## 2015-06-06 LAB — COMPREHENSIVE METABOLIC PANEL
ALK PHOS: 466 U/L — AB (ref 38–126)
ALT: 98 U/L — AB (ref 14–54)
ANION GAP: 8 (ref 5–15)
AST: 159 U/L — AB (ref 15–41)
Albumin: 2.2 g/dL — ABNORMAL LOW (ref 3.5–5.0)
BILIRUBIN TOTAL: 1.8 mg/dL — AB (ref 0.3–1.2)
BUN: 15 mg/dL (ref 6–20)
CO2: 24 mmol/L (ref 22–32)
Calcium: 9.5 mg/dL (ref 8.9–10.3)
Chloride: 102 mmol/L (ref 101–111)
Creatinine, Ser: 0.39 mg/dL — ABNORMAL LOW (ref 0.44–1.00)
GFR calc Af Amer: >60 mL/min (ref >60)
GLUCOSE: 142 mg/dL — AB (ref 65–99)
POTASSIUM: 4.5 mmol/L (ref 3.5–5.1)
Sodium: 134 mmol/L — ABNORMAL LOW (ref 135–145)
Total Protein: 6.4 g/dL — ABNORMAL LOW (ref 6.5–8.1)

## 2015-06-06 LAB — URINALYSIS COMPLETE WITH MICROSCOPIC (ARMC ONLY)
Bacteria, UA: NONE SEEN
Bilirubin Urine: NEGATIVE
Glucose, UA: NEGATIVE mg/dL
Hgb urine dipstick: NEGATIVE
KETONES UR: NEGATIVE mg/dL
Leukocytes, UA: NEGATIVE
Nitrite: NEGATIVE
PROTEIN: 30 mg/dL — AB
Specific Gravity, Urine: 1.025 (ref 1.005–1.030)
Squamous Epithelial / LPF: NONE SEEN
pH: 6 (ref 5.0–8.0)

## 2015-06-06 LAB — LACTIC ACID, PLASMA
Lactic Acid, Venous: 1.6 mmol/L (ref 0.5–2.0)
Lactic Acid, Venous: 2.2 mmol/L (ref 0.5–2.0)

## 2015-06-06 LAB — C-REACTIVE PROTEIN: CRP: 28.2 mg/dL — ABNORMAL HIGH (ref <1.0)

## 2015-06-06 LAB — C DIFFICILE QUICK SCREEN W PCR REFLEX
C DIFFICILE (CDIFF) INTERP: NEGATIVE
C DIFFICILE (CDIFF) TOXIN: NEGATIVE
C Diff antigen: NEGATIVE

## 2015-06-06 LAB — AMMONIA: AMMONIA: 47 umol/L — AB (ref 9–35)

## 2015-06-06 MED ORDER — HEPARIN SODIUM (PORCINE) 5000 UNIT/ML IJ SOLN
5000.0000 [IU] | Freq: Three times a day (TID) | INTRAMUSCULAR | Status: DC
  Administered 2015-06-06 – 2015-06-15 (×28): 5000 [IU] via SUBCUTANEOUS
  Filled 2015-06-06 (×28): qty 1

## 2015-06-06 MED ORDER — SODIUM CHLORIDE 0.9 % IV SOLN
INTRAVENOUS | Status: DC
  Administered 2015-06-06 – 2015-06-09 (×5): via INTRAVENOUS

## 2015-06-06 MED ORDER — ONDANSETRON HCL 4 MG/2ML IJ SOLN: 4.0000 mg | Freq: Four times a day (QID) | INTRAMUSCULAR | Status: DC | PRN

## 2015-06-06 MED ORDER — ONDANSETRON HCL 4 MG PO TABS: 4.0000 mg | ORAL_TABLET | Freq: Four times a day (QID) | ORAL | Status: DC | PRN

## 2015-06-06 MED ORDER — PAROXETINE HCL 20 MG PO TABS
40.0000 mg | ORAL_TABLET | Freq: Every day | ORAL | Status: DC
  Administered 2015-06-06 – 2015-06-15 (×10): 40 mg via ORAL
  Filled 2015-06-06 (×3): qty 2
  Filled 2015-06-06: qty 4
  Filled 2015-06-06: qty 2
  Filled 2015-06-06 (×2): qty 4
  Filled 2015-06-06 (×2): qty 2
  Filled 2015-06-06: qty 4
  Filled 2015-06-06: qty 2

## 2015-06-06 MED ORDER — LACTULOSE 10 GM/15ML PO SOLN
40.0000 g | Freq: Three times a day (TID) | ORAL | Status: DC
  Administered 2015-06-06 – 2015-06-10 (×10): 40 g via ORAL
  Filled 2015-06-06 (×9): qty 60

## 2015-06-06 MED ORDER — POTASSIUM GLUCONATE 550 MG PO TABS
550.0000 mg | ORAL_TABLET | Freq: Every day | ORAL | Status: DC
  Filled 2015-06-06: qty 1

## 2015-06-06 MED ORDER — DIMENHYDRINATE 50 MG PO TABS
50.0000 mg | ORAL_TABLET | Freq: Three times a day (TID) | ORAL | Status: DC | PRN
  Filled 2015-06-06: qty 1

## 2015-06-06 MED ORDER — ZINC GLUCONATE 50 MG PO TABS
50.0000 mg | ORAL_TABLET | Freq: Every day | ORAL | Status: DC
  Administered 2015-06-06 – 2015-06-15 (×9): 50 mg via ORAL
  Filled 2015-06-06 (×14): qty 1

## 2015-06-06 MED ORDER — ASPIRIN EC 81 MG PO TBEC
81.0000 mg | DELAYED_RELEASE_TABLET | Freq: Every day | ORAL | Status: DC
  Administered 2015-06-06 – 2015-06-15 (×10): 81 mg via ORAL
  Filled 2015-06-06 (×10): qty 1

## 2015-06-06 MED ORDER — FERROUS SULFATE 325 (65 FE) MG PO TABS
325.0000 mg | ORAL_TABLET | Freq: Two times a day (BID) | ORAL | Status: DC
Start: 2015-06-06 — End: 2015-06-15
  Administered 2015-06-06 – 2015-06-15 (×18): 325 mg via ORAL
  Filled 2015-06-06 (×18): qty 1

## 2015-06-06 MED ORDER — CEFAZOLIN SODIUM-DEXTROSE 2-3 GM-% IV SOLR
2.0000 g | Freq: Three times a day (TID) | INTRAVENOUS | Status: DC
  Administered 2015-06-06 – 2015-06-07 (×3): 2 g via INTRAVENOUS
  Filled 2015-06-06 (×4): qty 50

## 2015-06-06 MED ORDER — CEFAZOLIN SODIUM 1 G IJ SOLR: 2.0000 g | Freq: Three times a day (TID) | INTRAMUSCULAR | Status: DC

## 2015-06-06 MED ORDER — SODIUM CHLORIDE 0.9 % IV BOLUS (SEPSIS)
500.0000 mL | Freq: Once | INTRAVENOUS | Status: AC
  Administered 2015-06-06: 500 mL via INTRAVENOUS

## 2015-06-06 MED ORDER — DOCUSATE SODIUM 100 MG PO CAPS: 100.0000 mg | ORAL_CAPSULE | Freq: Two times a day (BID) | ORAL | Status: DC | PRN

## 2015-06-06 MED ORDER — LORATADINE 10 MG PO TABS
10.0000 mg | ORAL_TABLET | Freq: Every day | ORAL | Status: DC
  Administered 2015-06-06 – 2015-06-15 (×10): 10 mg via ORAL
  Filled 2015-06-06 (×10): qty 1

## 2015-06-06 MED ORDER — INSULIN ASPART 100 UNIT/ML ~~LOC~~ SOLN: 0.0000 [IU] | Freq: Every day | SUBCUTANEOUS | Status: DC

## 2015-06-06 MED ORDER — LISINOPRIL 10 MG PO TABS
10.0000 mg | ORAL_TABLET | Freq: Every day | ORAL | Status: DC
  Administered 2015-06-06 – 2015-06-10 (×5): 10 mg via ORAL
  Filled 2015-06-06 (×5): qty 1

## 2015-06-06 MED ORDER — CLONAZEPAM 1 MG PO TABS
1.0000 mg | ORAL_TABLET | Freq: Two times a day (BID) | ORAL | Status: DC
  Administered 2015-06-06 – 2015-06-15 (×18): 1 mg via ORAL
  Filled 2015-06-06 (×18): qty 1

## 2015-06-06 MED ORDER — HYDROXYZINE HCL 50 MG PO TABS
25.0000 mg | ORAL_TABLET | Freq: Three times a day (TID) | ORAL | Status: DC | PRN
  Administered 2015-06-13 – 2015-06-15 (×3): 25 mg via ORAL
  Filled 2015-06-06 (×3): qty 1

## 2015-06-06 MED ORDER — ACETAMINOPHEN 325 MG PO TABS: 650.0000 mg | ORAL_TABLET | Freq: Four times a day (QID) | ORAL | Status: DC | PRN

## 2015-06-06 MED ORDER — INSULIN ASPART 100 UNIT/ML ~~LOC~~ SOLN
0.0000 [IU] | Freq: Three times a day (TID) | SUBCUTANEOUS | Status: DC
  Administered 2015-06-07: 2 [IU] via SUBCUTANEOUS
  Administered 2015-06-07: 1 [IU] via SUBCUTANEOUS
  Administered 2015-06-08: 2 [IU] via SUBCUTANEOUS
  Administered 2015-06-09 – 2015-06-10 (×4): 1 [IU] via SUBCUTANEOUS
  Administered 2015-06-10: 2 [IU] via SUBCUTANEOUS
  Administered 2015-06-10 – 2015-06-11 (×2): 1 [IU] via SUBCUTANEOUS
  Administered 2015-06-11: 2 [IU] via SUBCUTANEOUS
  Administered 2015-06-12 (×3): 1 [IU] via SUBCUTANEOUS
  Administered 2015-06-13 – 2015-06-15 (×5): 2 [IU] via SUBCUTANEOUS
  Filled 2015-06-06: qty 1
  Filled 2015-06-06: qty 2
  Filled 2015-06-06: qty 1
  Filled 2015-06-06 (×3): qty 2
  Filled 2015-06-06 (×2): qty 1
  Filled 2015-06-06: qty 2
  Filled 2015-06-06: qty 1
  Filled 2015-06-06: qty 2
  Filled 2015-06-06: qty 1
  Filled 2015-06-06 (×3): qty 2
  Filled 2015-06-06 (×3): qty 1

## 2015-06-06 MED ORDER — FENTANYL CITRATE (PF) 100 MCG/2ML IJ SOLN
25.0000 ug | Freq: Once | INTRAMUSCULAR | Status: AC
Start: 2015-06-06 — End: 2015-06-06
  Administered 2015-06-06: 25 ug via INTRAVENOUS
  Filled 2015-06-06: qty 2

## 2015-06-06 MED ORDER — TURMERIC CURCUMIN 500 MG PO CAPS: 500.0000 mg | ORAL_CAPSULE | Freq: Every day | ORAL | Status: DC

## 2015-06-06 MED ORDER — HYDROCODONE-ACETAMINOPHEN 5-325 MG PO TABS
1.0000 | ORAL_TABLET | Freq: Four times a day (QID) | ORAL | Status: DC | PRN
  Administered 2015-06-06 – 2015-06-14 (×16): 1 via ORAL
  Filled 2015-06-06 (×16): qty 1

## 2015-06-06 MED ORDER — OXYCODONE-ACETAMINOPHEN 5-325 MG PO TABS
1.0000 | ORAL_TABLET | Freq: Four times a day (QID) | ORAL | Status: DC | PRN
  Administered 2015-06-06 – 2015-06-12 (×10): 1 via ORAL
  Filled 2015-06-06 (×10): qty 1

## 2015-06-06 MED ORDER — VANCOMYCIN HCL 10 G IV SOLR
1500.0000 mg | Freq: Once | INTRAVENOUS | Status: AC
  Administered 2015-06-06: 1500 mg via INTRAVENOUS
  Filled 2015-06-06: qty 1500

## 2015-06-06 MED ORDER — METHOCARBAMOL 500 MG PO TABS: 500.0000 mg | ORAL_TABLET | Freq: Four times a day (QID) | ORAL | Status: DC | PRN

## 2015-06-06 MED ORDER — PANTOPRAZOLE SODIUM 40 MG PO TBEC
40.0000 mg | DELAYED_RELEASE_TABLET | Freq: Every day | ORAL | Status: DC
  Administered 2015-06-06 – 2015-06-15 (×10): 40 mg via ORAL
  Filled 2015-06-06 (×10): qty 1

## 2015-06-06 MED ORDER — ALBUTEROL SULFATE (2.5 MG/3ML) 0.083% IN NEBU: 2.5000 mg | INHALATION_SOLUTION | RESPIRATORY_TRACT | Status: DC | PRN

## 2015-06-06 MED ORDER — VANCOMYCIN HCL IN DEXTROSE 1-5 GM/200ML-% IV SOLN
1000.0000 mg | Freq: Two times a day (BID) | INTRAVENOUS | Status: DC
  Administered 2015-06-07 – 2015-06-08 (×4): 1000 mg via INTRAVENOUS
  Filled 2015-06-06 (×5): qty 200

## 2015-06-06 MED ORDER — ACETAMINOPHEN 650 MG RE SUPP: 650.0000 mg | Freq: Four times a day (QID) | RECTAL | Status: DC | PRN

## 2015-06-06 NOTE — ED Notes (Addendum)
Pt had large BM, watery, malodorous. Changed, pericare performed. Pt has multiple sores on sacral area; one covered by dressing at this time. Skin on sacrum reddened, tender to touch. Stage 2 on sacrum approx 1in by 1inch. Elenore Rota, RN and Levada Dy, RN to help with changing of patient. MD made aware.

## 2015-06-06 NOTE — ED Notes (Signed)
Report given to Ally RN.

## 2015-06-06 NOTE — Progress Notes (Signed)
Paged MD regarding critical value. No new orders given.

## 2015-06-06 NOTE — ED Notes (Signed)
Family at bedside state that pt was at same mentality when discharged from hospital yesterday afternoon. Pt sent back to ER from [facility] for evaluation. Pt mumbling, unable to make sense of words. Pt able to answer basic questions. Pt unable to follow commands. Family at bedside.

## 2015-06-06 NOTE — ED Provider Notes (Signed)
[facility] Emergency Department Provider Note REMINDER - THIS NOTE IS NOT A FINAL MEDICAL RECORD UNTIL IT IS SIGNED. UNTIL THEN, THE CONTENT BELOW MAY REFLECT INFORMATION FROM A DOCUMENTATION TEMPLATE, NOT THE ACTUAL PATIENT VISIT. ____________________________________________  Time seen: Approximately 12:47 PM  I have reviewed the triage vital signs and the nursing notes.   HISTORY  Chief Complaint Altered Mental Status and Tachycardia  EM caveat: The patient is unable to provide history due to confusion.  HPI Sharon Kirby is a 64 y.o. female with recent diagnosis and discharge from MSSA bacteremia. Family reports that she's been confused throughout her hospital stay, and she was discharged to nursing facility but was noted to have a fever higher heart rate and ongoing confusion.  Reports patient will at times seemed to speak to them someone normally but then will become confused or very sleepy and seems to alternate off-and-on throughout the day yesterday.   Past Medical History  Diagnosis Date  . Seizures ([place])   . Stroke ([place])   . Brain tumor ([place])   . Diabetes mellitus without complication ([place])   . Arthritis   . Sleep apnea   . GERD (gastroesophageal reflux disease)   . Asthma   . Long QT interval   . Hypertension   . Panic attacks   . Anxiety   . Depression   . Syncope     Patient Active Problem List   Diagnosis Date Noted  . Sepsis ([place]) 06/06/2015  . Acute delirium 06/05/2015  . Hyponatremia 06/02/2015  . Acute blood loss anemia 04/13/2015  . Iron deficiency anemia   . Angiodysplasia of intestinal tract   . GI bleed 04/09/2015    Past Surgical History  Procedure Laterality Date  . Csf shunt      x2 due to tumor  . Cholecystectomy    . Tonsillectomy    . Carpal tunnel release Left   . Trigger finger release Left   . Knee surgery Left   . Colonoscopy with propofol N/A 04/10/2015    Procedure: COLONOSCOPY WITH PROPOFOL;   Surgeon: Lucilla Lame, MD;  Location: [facility] ENDOSCOPY;  Service: Endoscopy;  Laterality: N/A;  . Esophagogastroduodenoscopy (egd) with propofol N/A 04/10/2015    Procedure: ESOPHAGOGASTRODUODENOSCOPY (EGD) WITH PROPOFOL;  Surgeon: Lucilla Lame, MD;  Location: [facility] ENDOSCOPY;  Service: Endoscopy;  Laterality: N/A;    No current outpatient prescriptions on file.  Allergies Sulfa antibiotics and Penicillins  History reviewed. No pertinent family history.  Social History Social History  Substance Use Topics  . Smoking status: Never Smoker   . Smokeless tobacco: Never Used  . Alcohol Use: No    Review of Systems Patient hour provide, EM caveat  ____________________________________________   PHYSICAL EXAM:  VITAL SIGNS: ED Triage Vitals  Enc Vitals Group     BP 06/06/15 1049 131/87 mmHg     Pulse Rate 06/06/15 1049 114     Resp 06/06/15 1049 20     Temp 06/06/15 1049 99 F (37.2 C)     Temp Source 06/06/15 1049 Oral     SpO2 06/06/15 1049 92 %     Weight 06/06/15 1049 308 lb (139.708 kg)     Height 06/06/15 1049 5\' 1"  (1.549 m)     Head Cir --      Peak Flow --      Pain Score --      Pain Loc --      Pain Edu? --  Excl. in [place]? --    Constitutional: The patient is somnolent, slightly confused, unable to provide a clear history. She is able to recognize family at the bedside, she will occasionally seem to effusion brief conversation but has somewhat indiscernible language at this point. Family does report is an ongoing issue since being hospitalized and she was discharged in this condition Eyes: Conjunctivae are normal. PERRL. EOMI. Head: Atraumatic. Nose: No congestion/rhinnorhea. Mouth/Throat: Mucous membranes are dry.   Neck: No stridor.   Cardiovascular: Regarding rate, regular rhythm. Grossly normal heart sounds.  Good peripheral circulation. Respiratory: Normal respiratory effort.  No retractions. Lungs CTAB. Gastrointestinal: Soft and nontender with no rebound,  guarding or peritonitis. No distention. No abdominal bruits. No CVA tenderness. Patient is noted to have loose brown stool. Musculoskeletal: No lower extremity tenderness nor edema.  No joint effusions. Neurologic:  Somewhat incomprehensible speech, she will engage in some conversation but her speech is very difficult to discern. She is able to recognize her family, however she is unable to provide a significant history and is disoriented to place.  Skin:  Skin is warm, dry and intact. No rash noted. Psychiatric: The patient seems to demonstrate some intermittent waxing and waning mental status, and I'm certainly concerned about possible delirium.  ____________________________________________   LABS (all labs ordered are listed, but only abnormal results are displayed)  Labs Reviewed  COMPREHENSIVE METABOLIC PANEL - Abnormal; Notable for the following:    Sodium 134 (*)    Glucose, Bld 142 (*)    Creatinine, Ser 0.39 (*)    Total Protein 6.4 (*)    Albumin 2.2 (*)    AST 159 (*)    ALT 98 (*)    Alkaline Phosphatase 466 (*)    Total Bilirubin 1.8 (*)    All other components within normal limits  CBC WITH DIFFERENTIAL/PLATELET - Abnormal; Notable for the following:    WBC 16.2 (*)    RBC 3.76 (*)    Hemoglobin 10.7 (*)    HCT 32.8 (*)    RDW 26.6 (*)    Platelets 146 (*)    nRBC 1 (*)    Neutro Abs 13.0 (*)    Monocytes Absolute 1.5 (*)    Basophils Absolute 0.2 (*)    All other components within normal limits  URINALYSIS COMPLETEWITH MICROSCOPIC ([facility] ONLY) - Abnormal; Notable for the following:    Color, Urine AMBER (*)    APPearance CLEAR (*)    Protein, ur 30 (*)    All other components within normal limits  AMMONIA - Abnormal; Notable for the following:    Ammonia 47 (*)    All other components within normal limits  C DIFFICILE QUICK SCREEN W PCR REFLEX  CULTURE, BLOOD (ROUTINE X 2)  CULTURE, BLOOD (ROUTINE X 2)  URINE CULTURE  LACTIC ACID, PLASMA  LACTIC ACID,  PLASMA  PATHOLOGIST SMEAR REVIEW  C-REACTIVE PROTEIN   ____________________________________________  EKG  Reviewed and interpreted by me Sinus tachycardia Heart rate 114 Evidence of right bundle branch block Possible inferior infarct noted No ST elevation noted, T wave inversions noted in V1 through V3 expected in the setting of right bundle branch block  ____________________________________________  RADIOLOGY   CT Head Wo Contrast (Final result) Result time: 06/06/15 12:36:00   Final result by Rad Results In Interface (06/06/15 12:36:00)   Narrative:   CLINICAL DATA: Pt presents from [facility] with c/o AMS. Per EMS report pt was d/c from hospital yesterday for sepsis and  admitted to [facility]. Pt tachycardic upon arrival with incomprehensible speech. Daughter states pt history of brain tumor, seizures and stroke.  EXAM: CT HEAD WITHOUT CONTRAST  TECHNIQUE: Contiguous axial images were obtained from the base of the skull through the vertex without intravenous contrast.  COMPARISON: 05/11/2015  FINDINGS: Bilateral ventriculostomy is extend from the parietal bones to the anterior aspects of both lateral ventricles, without change.  Ventricles are enlarged, similar to the prior study, consistent with diffuse atrophy. There is no convincing hydrocephalus.  There are no parenchymal masses or mass effect. There is no evidence of a cortical infarct. There are no extra-axial masses or abnormal fluid collections.  There is no intracranial hemorrhage.  The visualized sinuses and mastoid air cells are clear.  IMPRESSION: 1. No acute intracranial abnormalities. 2. Bilateral ventriculostomy is are stable. The degree of ventricular enlargement is stable.   Electronically Signed By: Lajean Manes M.D. On: 06/06/2015 12:36          DG Chest Port 1 View (Final result) Result time: 06/06/15 [id number]   Final result by Rad Results In  Interface (06/06/15 12:22:22)   Narrative:   CLINICAL DATA: Confusion.  EXAM: PORTABLE CHEST 1 VIEW  COMPARISON: 06/05/2015  FINDINGS: Rotational artifact. Right arm PICC line is noted with tip in the SVC. Heart size is moderately enlarged. The lung volumes are low. Pulmonary vascular congestion noted. There is no pleural effusion or edema.  IMPRESSION: 1. Cardiac enlargement and low lung volumes. 2. Pulmonary vascular congestion.   Electronically Signed By: Kerby Moors M.D. On: 06/06/2015 12:22       ____________________________________________   PROCEDURES  Procedure(s) performed: None  Critical Care performed: No  ____________________________________________   INITIAL IMPRESSION / ASSESSMENT AND PLAN / ED COURSE  Pertinent labs & imaging results that were available during my care of the patient were reviewed by me and considered in my medical decision making (see chart for details).  Patient presents for ongoing alteration in mental status, she seems to have apparent delirium with waxing and waning mental status even in the ER and confusion. No focal abnormality. CT scan of the head is to performed and the patient's history of previous ventriculostomy's, in addition ongoing confusion rule out intracranial hemorrhage or stroke.  Although I suspect is likely secondary to sepsis with fever and tachycardia, I we'll broaden her antibiotics including vancomycin and she is artery on cephalosporin with target therapy based on cultures.  She is respiratory well, she is not in any significant distress however I am certainly concerned about her waxing and waning mental status and ongoing symptoms. The patient requires readmission to the hospital for concerns of possible sepsis with known bacteremia.  Her LFTs are notably elevated, but apparently she has a history of cirrhosis and is on lactulose for this with previous evaluation in the hospital, and she does not  demonstrate any focality or tenderness in the right upper quadrant to exam to make me think of acute cholecystitis or other acute cause at this time. We'll continue to monitor the patient closely, admitted for IV antibiotics and further evaluation and workup by the hospitalist service. ____________________________________________   FINAL CLINICAL IMPRESSION(S) / ED DIAGNOSES  Final diagnoses:  Sepsis, due to unspecified organism ([place])  Acute delirium  Transaminitis      Delman Kitten, MD 06/06/15 [phone]

## 2015-06-06 NOTE — ED Notes (Signed)
Pt presents from [facility] with c/o AMS. Per EMS report pt was d/c from hospital yesterday for sepsis and admitted to [facility]. Pt tachycardic upon arrival with incomprehensible speech. EMS reports pt responds appropriately to questions.

## 2015-06-06 NOTE — H&P (Addendum)
[facility] NAME: Sharon Kirby    MR#:  [id number]  DATE OF BIRTH:  March 30, 1951  DATE OF ADMISSION:  06/06/2015  PRIMARY CARE PHYSICIAN: Madelyn Brunner, MD   REQUESTING/REFERRING PHYSICIAN: Delman Kitten, MD  CHIEF COMPLAINT:   Chief Complaint  Patient presents with  . Altered Mental Status  . Tachycardia   confusion and tachycardia.  HISTORY OF PRESENT ILLNESS:  Takia Downhour  is a 64 y.o. female with a known history of hypertension, diabetes and recent sepsis. The patient was just discharged from our hospital to nursing home after treatment of sepsis, bacteremia and cellulitis. She was supposed to continue IV cefazolin for 2 weeks recommended by Dr. Ola Spurr. The patient was found confused and tachycardia yesterday. So she was sent to ED from nursing home today. She was found to have a fever, leukocytosis and tachycardia. She was treated with vancomycin ED. She had diarrhea which is possibly due to lactulose. According to patient's daughter, she was started with the lactulose for liver cirrhosis.  PAST MEDICAL HISTORY:   Past Medical History  Diagnosis Date  . Seizures ([place])   . Stroke ([place])   . Brain tumor ([place])   . Diabetes mellitus without complication ([place])   . Arthritis   . Sleep apnea   . GERD (gastroesophageal reflux disease)   . Asthma   . Long QT interval   . Hypertension   . Panic attacks   . Anxiety   . Depression   . Syncope     PAST SURGICAL HISTORY:   Past Surgical History  Procedure Laterality Date  . Csf shunt      x2 due to tumor  . Cholecystectomy    . Tonsillectomy    . Carpal tunnel release Left   . Trigger finger release Left   . Knee surgery Left   . Colonoscopy with propofol N/A 04/10/2015    Procedure: COLONOSCOPY WITH PROPOFOL;  Surgeon: Lucilla Lame, MD;  Location: [facility] ENDOSCOPY;  Service: Endoscopy;  Laterality: N/A;  . Esophagogastroduodenoscopy (egd) with propofol  N/A 04/10/2015    Procedure: ESOPHAGOGASTRODUODENOSCOPY (EGD) WITH PROPOFOL;  Surgeon: Lucilla Lame, MD;  Location: [facility] ENDOSCOPY;  Service: Endoscopy;  Laterality: N/A;    SOCIAL HISTORY:   Social History  Substance Use Topics  . Smoking status: Never Smoker   . Smokeless tobacco: Never Used  . Alcohol Use: No    FAMILY HISTORY:  History reviewed. No pertinent family history.  DRUG ALLERGIES:   Allergies  Allergen Reactions  . Sulfa Antibiotics Anaphylaxis  . Penicillins Rash and Other (See Comments)    Has patient had a PCN reaction causing immediate rash, facial/tongue/throat swelling, SOB or lightheadedness with hypotension: No Has patient had a PCN reaction causing severe rash involving mucus membranes or skin necrosis: No Has patient had a PCN reaction that required hospitalization No Has patient had a PCN reaction occurring within the last 10 years: Yes If all of the above answers are "NO", then may proceed with Cephalosporin use.    REVIEW OF SYSTEMS:  Unable to open due to confusion.  MEDICATIONS AT HOME:   Prior to Admission medications   Medication Sig Start Date End Date Taking? Authorizing Provider  aspirin EC 81 MG tablet Take 81 mg by mouth daily.   Yes Historical Provider, MD  Calcium Carb-Cholecalciferol (CALCIUM 600+D) 600-800 MG-UNIT TABS Take 1 tablet by mouth daily.   Yes Historical Provider, MD  ceFAZolin 2  g in dextrose 5 % 50 mL ivpb Inject 2 g into the vein every 8 (eight) hours. 06/05/15  Yes Max Sane, MD  cetirizine (ZYRTEC) 10 MG tablet Take 10 mg by mouth daily.   Yes Historical Provider, MD  Cholecalciferol (VITAMIN D) 2000 UNITS CAPS Take 2,000 Units by mouth daily.   Yes Historical Provider, MD  clonazePAM (KLONOPIN) 1 MG tablet Take 1 mg by mouth 2 (two) times daily.   Yes Historical Provider, MD  dimenhyDRINATE (DRAMAMINE) 50 MG tablet Take 50 mg by mouth every 8 (eight) hours as needed for nausea.   Yes Historical Provider, MD  docusate  sodium (COLACE) 100 MG capsule Take 100 mg by mouth every 12 (twelve) hours as needed for mild constipation or moderate constipation.    Yes Historical Provider, MD  ferrous sulfate 325 (65 FE) MG tablet Take 1 tablet (325 mg total) by mouth 2 (two) times daily with a meal. 04/13/15  Yes Srikar Sudini, MD  glipiZIDE (GLUCOTROL XL) 10 MG 24 hr tablet Take 10 mg by mouth daily.   Yes Historical Provider, MD  HYDROcodone-acetaminophen (NORCO/VICODIN) 5-325 MG tablet Take 1 tablet by mouth every 6 (six) hours as needed for moderate pain. 06/05/15  Yes Max Sane, MD  hydrOXYzine (ATARAX/VISTARIL) 25 MG tablet Take 25 mg by mouth every 8 (eight) hours as needed for itching.    Yes Historical Provider, MD  ibuprofen (ADVIL,MOTRIN) 800 MG tablet Take 800 mg by mouth every 8 (eight) hours as needed for mild pain.   Yes Historical Provider, MD  lactulose (CHRONULAC) 10 GM/15ML solution Take 40 g by mouth 3 (three) times daily.   Yes Historical Provider, MD  lisinopril (PRINIVIL,ZESTRIL) 10 MG tablet Take 10 mg by mouth daily.   Yes Historical Provider, MD  metFORMIN (GLUCOPHAGE) 1000 MG tablet Take 1,000 mg by mouth 2 (two) times daily with a meal.   Yes Historical Provider, MD  methocarbamol (ROBAXIN) 500 MG tablet Take 500 mg by mouth every 6 (six) hours as needed for muscle spasms.   Yes Historical Provider, MD  Multiple Vitamin (MULTIVITAMIN WITH MINERALS) TABS tablet Take 1 tablet by mouth daily.   Yes Historical Provider, MD  omeprazole (PRILOSEC) 20 MG capsule Take 20 mg by mouth daily.   Yes Historical Provider, MD  oxyCODONE-acetaminophen (ROXICET) 5-325 MG tablet Take 1 tablet by mouth every 6 (six) hours as needed for severe pain. 06/05/15  Yes Vipul Manuella Ghazi, MD  PARoxetine (PAXIL) 40 MG tablet Take 40 mg by mouth daily.   Yes Historical Provider, MD  Potassium Gluconate 550 MG TABS Take 550 mg by mouth daily.   Yes Historical Provider, MD  Turmeric Curcumin 500 MG CAPS Take 500 mg by mouth daily.   Yes  Historical Provider, MD  zinc gluconate 50 MG tablet Take 50 mg by mouth daily.   Yes Historical Provider, MD      VITAL SIGNS:  Blood pressure 132/91, pulse 107, temperature 100.1 F (37.8 C), temperature source Oral, resp. rate 17, height 5\' 1"  (1.549 m), weight 141.069 kg (311 lb), SpO2 95 %.  PHYSICAL EXAMINATION:  GENERAL:  64 y.o.-year-old patient lying in the bed with confusion. Morbid obesity. EYES: Pupils equal, round, reactive to light and accommodation. No scleral icterus. Extraocular muscles intact.  HEENT: Head atraumatic, normocephalic. Oropharynx and nasopharynx clear.  NECK:  Supple, no jugular venous distention. No thyroid enlargement, no tenderness.  LUNGS: Normal breath sounds bilaterally, no wheezing, rales,rhonchi or crepitation. No use of accessory muscles  of respiration.  CARDIOVASCULAR: S1, S2 normal. No murmurs, rubs, or gallops.  ABDOMEN: Soft, nontender, nondistended. Bowel sounds present. Unable to exam but the patient has organomegaly or mass due to morbid obesity.Marland Kitchen  EXTREMITIES: No pedal edema, cyanosis, or clubbing.  NEUROLOGIC: Unable to exam. PSYCHIATRIC: The patient is confused.Marland Kitchen  SKIN: No obvious rash, lesion, or ulcer. Buttock Skin ulcer in dressing.  LABORATORY PANEL:   CBC  Recent Labs Lab 06/06/15 1104  WBC 16.2*  HGB 10.7*  HCT 32.8*  PLT 146*   ------------------------------------------------------------------------------------------------------------------  Chemistries   Recent Labs Lab 06/06/15 1104  NA 134*  K 4.5  CL 102  CO2 24  GLUCOSE 142*  BUN 15  CREATININE 0.39*  CALCIUM 9.5  AST 159*  ALT 98*  ALKPHOS 466*  BILITOT 1.8*   ------------------------------------------------------------------------------------------------------------------  Cardiac Enzymes  Recent Labs Lab 06/03/15 0459  TROPONINI <0.03    ------------------------------------------------------------------------------------------------------------------  RADIOLOGY:  Dg Chest 1 View  06/05/2015  CLINICAL DATA:  Right-sided PICC placement EXAM: CHEST 1 VIEW COMPARISON:  06/02/2015 FINDINGS: Right PICC tip projects in the lower superior vena cava, well positioned. Lung volumes remain low. There is mild basilar atelectasis. No convincing pneumonia or edema. No pleural effusion or pneumothorax. Cardiac silhouette is mildly enlarged. IMPRESSION: 1. Right PICC is well positioned with its tip in the lower superior vena cava. 2. No acute findings in the lungs. No other change from the prior study. Electronically Signed   By: Lajean Manes M.D.   On: 06/05/2015 10:49   Ct Head Wo Contrast  06/06/2015  CLINICAL DATA:  Pt presents from [facility] with c/o AMS. Per EMS report pt was d/c from hospital yesterday for sepsis and admitted to [facility]. Pt tachycardic upon arrival with incomprehensible speech. Daughter states pt history of brain tumor, seizures and stroke. EXAM: CT HEAD WITHOUT CONTRAST TECHNIQUE: Contiguous axial images were obtained from the base of the skull through the vertex without intravenous contrast. COMPARISON:  05/11/2015 FINDINGS: Bilateral ventriculostomy is extend from the parietal bones to the anterior aspects of both lateral ventricles, without change. Ventricles are enlarged, similar to the prior study, consistent with diffuse atrophy. There is no convincing hydrocephalus. There are no parenchymal masses or mass effect. There is no evidence of a cortical infarct. There are no extra-axial masses or abnormal fluid collections. There is no intracranial hemorrhage. The visualized sinuses and mastoid air cells are clear. IMPRESSION: 1. No acute intracranial abnormalities. 2. Bilateral ventriculostomy is are stable. The degree of ventricular enlargement is stable. Electronically Signed   By: Lajean Manes M.D.    On: 06/06/2015 12:36   Dg Chest Port 1 View  06/06/2015  CLINICAL DATA:  Confusion. EXAM: PORTABLE CHEST 1 VIEW COMPARISON:  06/05/2015 FINDINGS: Rotational artifact. Right arm PICC line is noted with tip in the SVC. Heart size is moderately enlarged. The lung volumes are low. Pulmonary vascular congestion noted. There is no pleural effusion or edema. IMPRESSION: 1. Cardiac enlargement and low lung volumes. 2. Pulmonary vascular congestion. Electronically Signed   By: Kerby Moors M.D.   On: 06/06/2015 12:22    EKG:   Orders placed or performed during the hospital encounter of 06/06/15  . EKG 12-Lead  . EKG 12-Lead  . ED EKG  . ED EKG    IMPRESSION AND PLAN:   Sepsis (fever, tachycardia and leukocytosis) Acute metabolic encephalopathy Mild elevated ammonia. Hyponatremia Abnormal liver function test Hypertension Diabetes Morbid Obesity  The patient will be admitted to medical floor.  I will start vancomycin and continue cefazolin, IV fluid support, follow-up CBC and blood culture, follow-up Dr. Ola Spurr ID consult. For diabetes, hold metformin and glipizide, start sliding scale. Continue hypertension medication. Continue lactulose for mild elevated ammonia level.    All the records are reviewed and case discussed with ED provider. Management plans discussed with the patient's daughter and son and they are in agreement.  CODE STATUS: Full code  TOTAL TIME TAKING CARE OF THIS PATIENT: 55 minutes.    Demetrios Loll M.D on 06/06/2015 at 2:00 PM  Between 7am to 6pm - Pager - [phone]  After 6pm go to [email or web address] - password [email or web address] [facility]  Office  [phone]  CC: Primary care physician; Madelyn Brunner, MD

## 2015-06-06 NOTE — Progress Notes (Addendum)
PHARMACIST - PHYSICIAN ORDER COMMUNICATION  CONCERNING: P&T Medication Policy on Herbal Medications  DESCRIPTION:  This patient's order for: Turneric 500mg  and Potassium Gluconate 550mg   has been noted.  This product(s) is classified as an "herbal" or natural product. Due to a lack of definitive safety studies or FDA approval, nonstandard manufacturing practices, plus the potential risk of unknown drug-drug interactions while on inpatient medications, the Pharmacy and Therapeutics Committee does not permit the use of "herbal" or natural products of this type within [facility].   ACTION TAKEN: The pharmacy department is unable to verify this order at this time and your patient has been informed of this safety policy. Please reevaluate patient's clinical condition at discharge and address if the herbal or natural product(s) should be resumed at that time.

## 2015-06-06 NOTE — Progress Notes (Signed)
ANTIBIOTIC CONSULT NOTE - INITIAL  Pharmacy Consult for Vancomycin Indication: Sepsis  Allergies  Allergen Reactions  . Sulfa Antibiotics Anaphylaxis  . Penicillins Rash and Other (See Comments)    Has patient had a PCN reaction causing immediate rash, facial/tongue/throat swelling, SOB or lightheadedness with hypotension: No Has patient had a PCN reaction causing severe rash involving mucus membranes or skin necrosis: No Has patient had a PCN reaction that required hospitalization No Has patient had a PCN reaction occurring within the last 10 years: Yes If all of the above answers are "NO", then may proceed with Cephalosporin use.    Patient Measurements: Height: 5\' 1"  (154.9 cm) Weight: (!) 311 lb (141.069 kg) IBW/kg (Calculated) : 47.8 Adjusted Body Weight: 85.12 kg  Vital Signs: Temp: 97.5 F (36.4 C) (11/19 1437) Temp Source: Oral (11/19 1437) BP: 141/47 mmHg (11/19 1437) Pulse Rate: 106 (11/19 1437) Intake/Output from previous day:   Intake/Output from this shift: Total I/O In: -  Out: 325 [Urine:325]  Labs:  Recent Labs  06/04/15 0524 06/06/15 1104  WBC 10.7 16.2*  HGB 10.3* 10.7*  PLT 110* 146*  CREATININE 0.64 0.39*   Estimated Creatinine Clearance: 95.4 mL/min (by C-G formula based on Cr of 0.39).  Recent Labs  06/04/15 1933  [place] [address]     Microbiology: Recent Results (from the past 720 hour(s))  Urine culture     Status: None   Collection Time: 06/02/15  6:38 PM  Result Value Ref Range Status   Specimen Description URINE, RANDOM  Final   Special Requests NONE  Final   Culture 30,000 COLONIES/mL STAPHYLOCOCCUS AUREUS  Final   Report Status 06/05/2015 FINAL  Final   Organism ID, Bacteria STAPHYLOCOCCUS AUREUS  Final      Susceptibility   Staphylococcus aureus - MIC*    CIPROFLOXACIN >=8 RESISTANT Resistant     ERYTHROMYCIN 4 RESISTANT Resistant     GENTAMICIN <=0.5 SENSITIVE Sensitive     OXACILLIN 1 SENSITIVE Sensitive    TRIMETH/SULFA <=10 SENSITIVE Sensitive     CLINDAMYCIN <=0.25 RESISTANT Resistant     CEFOXITIN SCREEN NEGATIVE Sensitive     Inducible Clindamycin Value in next row Resistant      POSITIVEINDUCIBLE CLINDAMYCIN RESISTANCE - A positive ICR test is indicative of inducible resistance to macrolides, lincosamides, and type B streptogramin.  This isolate is presumed to be resistant to Clindamycin, however, Clindamycin may still be effective in some patients.     TETRACYCLINE Value in next row Sensitive      SENSITIVE<=1    * 30,000 COLONIES/mL STAPHYLOCOCCUS AUREUS  Blood culture (routine x 2)     Status: None   Collection Time: 06/02/15  6:39 PM  Result Value Ref Range Status   Specimen Description BLOOD RIGHT ARM  Final   Special Requests BOTTLES DRAWN AEROBIC AND ANAEROBIC 4CC  Final   Culture  Setup Time   Final    GRAM POSITIVE COCCI IN CLUSTERS IN BOTH AEROBIC AND ANAEROBIC BOTTLES CRITICAL RESULT CALLED TO, READ BACK BY AND VERIFIED WITH: DONEESHA ROBERTSON [id number] ON 06/03/15 CTJ    Culture   Final    STAPHYLOCOCCUS AUREUS IN BOTH AEROBIC AND ANAEROBIC BOTTLES    Report Status 06/05/2015 FINAL  Final   Organism ID, Bacteria STAPHYLOCOCCUS AUREUS  Final      Susceptibility   Staphylococcus aureus - MIC*    CIPROFLOXACIN >=8 RESISTANT Resistant     ERYTHROMYCIN 4 RESISTANT Resistant     GENTAMICIN <=0.5 SENSITIVE Sensitive  OXACILLIN <=0.25 SENSITIVE Sensitive     TRIMETH/SULFA <=10 SENSITIVE Sensitive     CLINDAMYCIN <=0.25 RESISTANT Resistant     CEFOXITIN SCREEN NEGATIVE Sensitive     Inducible Clindamycin Value in next row Resistant      POSITIVEINDUCIBLE CLINDAMYCIN RESISTANCE - A positive ICR test is indicative of inducible resistance to macrolides, lincosamides, and type B streptogramin.  This isolate is presumed to be resistant to Clindamycin, however, Clindamycin may still be effective in some patients.     TETRACYCLINE Value in next row Sensitive      SENSITIVE<=1    *  STAPHYLOCOCCUS AUREUS  Blood culture (routine x 2)     Status: None   Collection Time: 06/02/15  6:39 PM  Result Value Ref Range Status   Specimen Description BLOOD RIGHT ASSIST CONTROL  Final   Special Requests BOTTLES DRAWN AEROBIC AND ANAEROBIC 4CC  Final   Culture  Setup Time   Final    GRAM POSITIVE COCCI IN CLUSTERS IN BOTH AEROBIC AND ANAEROBIC BOTTLES CRITICAL VALUE NOTED.  VALUE IS CONSISTENT WITH PREVIOUSLY REPORTED AND CALLED VALUE.    Culture   Final    STAPHYLOCOCCUS AUREUS IN BOTH AEROBIC AND ANAEROBIC BOTTLES    Report Status 06/05/2015 FINAL  Final   Organism ID, Bacteria STAPHYLOCOCCUS AUREUS  Final      Susceptibility   Staphylococcus aureus - MIC*    CIPROFLOXACIN >=8 RESISTANT Resistant     ERYTHROMYCIN 4 RESISTANT Resistant     GENTAMICIN <=0.5 SENSITIVE Sensitive     OXACILLIN <=0.25 SENSITIVE Sensitive     TRIMETH/SULFA <=10 SENSITIVE Sensitive     CLINDAMYCIN <=0.25 RESISTANT Resistant     CEFOXITIN SCREEN NEGATIVE Sensitive     Inducible Clindamycin Value in next row Resistant      POSITIVEINDUCIBLE CLINDAMYCIN RESISTANCE - A positive ICR test is indicative of inducible resistance to macrolides, lincosamides, and type B streptogramin.  This isolate is presumed to be resistant to Clindamycin, however, Clindamycin may still be effective in some patients.     TETRACYCLINE Value in next row Sensitive      SENSITIVE<=1    * STAPHYLOCOCCUS AUREUS  Culture, blood (routine x 2)     Status: None (Preliminary result)   Collection Time: 06/03/15  3:34 PM  Result Value Ref Range Status   Specimen Description BLOOD LEFT ARM  Final   Special Requests BOTTLES DRAWN AEROBIC AND ANAEROBIC 4CC  Final   Culture NO GROWTH 3 DAYS  Final   Report Status PENDING  Incomplete  Culture, blood (routine x 2)     Status: None (Preliminary result)   Collection Time: 06/03/15  3:46 PM  Result Value Ref Range Status   Specimen Description BLOOD LEFT ARM  Final   Special Requests  BOTTLES DRAWN AEROBIC AND ANAEROBIC 4CC  Final   Culture NO GROWTH 3 DAYS  Final   Report Status PENDING  Incomplete  C difficile quick scan w PCR reflex     Status: None   Collection Time: 06/06/15 11:48 AM  Result Value Ref Range Status   C Diff antigen NEGATIVE NEGATIVE Final   C Diff toxin NEGATIVE NEGATIVE Final   C Diff interpretation Negative for C. difficile  Final    Medical History: Past Medical History  Diagnosis Date  . Seizures ([place])   . Stroke ([place])   . Brain tumor ([place])   . Diabetes mellitus without complication ([place])   . Arthritis   . Sleep apnea   .  GERD (gastroesophageal reflux disease)   . Asthma   . Long QT interval   . Hypertension   . Panic attacks   . Anxiety   . Depression   . Syncope      Assessment: 64 yo female here with sepsis; recent d/c for sepsis due to cellulitis. Pt received vancomycin 1500 mg IV x1 in ED at 1454.  Goal of Therapy:  Vancomycin trough level 15-20 mcg/ml  Plan:  1. Will continue dosing with vancomycin 1000 mg IV q12h 2. VT before 4th dose - 11/21 at 1130 3. Will need to continue to monitor renal function and trough at steady state.  Pt at risk for accumulation.   Pharmacy will continue to follow.    Rocky Morel 06/06/2015,3:22 PM

## 2015-06-06 NOTE — ED Notes (Addendum)
X-ray at bedside

## 2015-06-07 DIAGNOSIS — A4101 Sepsis due to Methicillin susceptible Staphylococcus aureus: Principal | ICD-10-CM

## 2015-06-07 LAB — LACTIC ACID, PLASMA: LACTIC ACID, VENOUS: 1.3 mmol/L (ref 0.5–2.0)

## 2015-06-07 LAB — BASIC METABOLIC PANEL
ANION GAP: 6 (ref 5–15)
BUN: 16 mg/dL (ref 6–20)
CO2: 22 mmol/L (ref 22–32)
Calcium: 8.7 mg/dL — ABNORMAL LOW (ref 8.9–10.3)
Chloride: 106 mmol/L (ref 101–111)
Creatinine, Ser: 0.4 mg/dL — ABNORMAL LOW (ref 0.44–1.00)
GFR calc Af Amer: >60 mL/min (ref >60)
Glucose, Bld: 119 mg/dL — ABNORMAL HIGH (ref 65–99)
POTASSIUM: 3.6 mmol/L (ref 3.5–5.1)
SODIUM: 134 mmol/L — AB (ref 135–145)

## 2015-06-07 LAB — CBC
HEMATOCRIT: 30.3 % — AB (ref 35.0–47.0)
HEMOGLOBIN: 10 g/dL — AB (ref 12.0–16.0)
MCH: 28.8 pg (ref 26.0–34.0)
MCHC: 32.9 g/dL (ref 32.0–36.0)
MCV: 87.6 fL (ref 80.0–100.0)
Platelets: 139 10*3/uL — ABNORMAL LOW (ref 150–440)
RBC: 3.46 MIL/uL — ABNORMAL LOW (ref 3.80–5.20)
RDW: 26 % — AB (ref 11.5–14.5)
WBC: 16.5 10*3/uL — AB (ref 3.6–11.0)

## 2015-06-07 LAB — GLUCOSE, CAPILLARY
GLUCOSE-CAPILLARY: 106 mg/dL — AB (ref 65–99)
GLUCOSE-CAPILLARY: 146 mg/dL — AB (ref 65–99)
GLUCOSE-CAPILLARY: 128 mg/dL — AB (ref 65–99)
Glucose-Capillary: 159 mg/dL — ABNORMAL HIGH (ref 65–99)

## 2015-06-07 MED ORDER — DEXTROSE 5 % IV SOLN
1.0000 g | INTRAVENOUS | Status: DC
  Administered 2015-06-07: 1 g via INTRAVENOUS
  Filled 2015-06-07 (×2): qty 10

## 2015-06-07 NOTE — Progress Notes (Signed)
06/07/2015 9:54 AM  Dr. Darvin Neighbours notified of all 4 blood cultures positive for gram positive cocci in clusters. No new orders. Continue to monitor.  Almedia Balls, RN

## 2015-06-07 NOTE — Progress Notes (Addendum)
Patient A/Disoriented (time,place, situation). Patient restless throughout the night and yells out. Staff turned patient every 2 hours. Staff administered barrier on bottom. Staff administered a complete bedbath and linen change. Administered IV ABT, tolerated well. Administered prn pain regimen resulted to behavior (see, emar); effective. Redness on sacrum, noted incont dermitis and abrasion on upper thigh. Scattered bruises.Readjusted rectal tube and reposition patient. There is sufficient drainage around the tube. Little to none going in the collection bag. Note enough to empty.  Staff will continue to monitor and meet needs.

## 2015-06-07 NOTE — Progress Notes (Signed)
   06/07/15 1900  Clinical Encounter Type  Visited With Patient  Visit Type Initial  Referral From Nurse  Spiritual Encounters  Spiritual Needs Emotional  Stress Factors  Patient Stress Factors Other (Comment)  Chaplain was paged to the unit as the patient desired a visit. Offered a compassionate presence. Patient was emotional and desired to call her "daddy." Dialed the number she could recall but not sure if it was successful. Patient dozed in and out and was not very clear with her thoughts. Chaplain Shaun Runyon A. Ori Kreiter Ext. [phone]

## 2015-06-07 NOTE — Consult Note (Addendum)
Primary Care Physician: Madelyn Brunner, MD Referring Physician:  Admit Date: 06/06/2015  Reason for consultation: TEE  Sharon Kirby is a 64 y.o. female with a h/o diabetes, hypertension, anxiety, stroke, and depression. She had a previous discharge from the hospital on 10/18 she was admitted for sepsis bacteremia and cellulitis found to be MSSA. She was discharged on IV cefazolin. She was found confused and tachycardic on the day of discharge and was brought to the emergency room  from the nursing home. Blood culture showed gram-positive cocci in clusters in 2 out of 2 bottles. She is currently on vancomycin and ceftriaxone.  She mainly complains of pain in her legs. Per her friend who is in the room with her, she has times where she just nods off. She does not complain of chest pain shortness of breath PND or orthopnea.    Past Medical History  Diagnosis Date  . Seizures ([place])   . Stroke ([place])   . Brain tumor ([place])   . Diabetes mellitus without complication ([place])   . Arthritis   . Sleep apnea   . GERD (gastroesophageal reflux disease)   . Asthma   . Long QT interval   . Hypertension   . Panic attacks   . Anxiety   . Depression   . Syncope    Past Surgical History  Procedure Laterality Date  . Csf shunt      x2 due to tumor  . Cholecystectomy    . Tonsillectomy    . Carpal tunnel release Left   . Trigger finger release Left   . Knee surgery Left   . Colonoscopy with propofol N/A 04/10/2015    Procedure: COLONOSCOPY WITH PROPOFOL;  Surgeon: Lucilla Lame, MD;  Location: [facility] ENDOSCOPY;  Service: Endoscopy;  Laterality: N/A;  . Esophagogastroduodenoscopy (egd) with propofol N/A 04/10/2015    Procedure: ESOPHAGOGASTRODUODENOSCOPY (EGD) WITH PROPOFOL;  Surgeon: Lucilla Lame, MD;  Location: [facility] ENDOSCOPY;  Service: Endoscopy;  Laterality: N/A;    . aspirin EC  81 mg Oral Daily  . cefTRIAXone (ROCEPHIN)  IV  1 g Intravenous Q24H  . clonazePAM  1 mg Oral BID  . ferrous  sulfate  325 mg Oral BID WC  . heparin  5,000 Units Subcutaneous 3 times per day  . insulin aspart  0-5 Units Subcutaneous QHS  . insulin aspart  0-9 Units Subcutaneous TID WC  . lactulose  40 g Oral TID  . lisinopril  10 mg Oral Daily  . loratadine  10 mg Oral Daily  . pantoprazole  40 mg Oral Daily  . PARoxetine  40 mg Oral Daily  . vancomycin  1,000 mg Intravenous Q12H  . zinc gluconate  50 mg Oral Daily   . sodium chloride 100 mL/hr at 06/07/15 [id number]    Allergies  Allergen Reactions  . Sulfa Antibiotics Anaphylaxis  . Penicillins Rash and Other (See Comments)    Has patient had a PCN reaction causing immediate rash, facial/tongue/throat swelling, SOB or lightheadedness with hypotension: No Has patient had a PCN reaction causing severe rash involving mucus membranes or skin necrosis: No Has patient had a PCN reaction that required hospitalization No Has patient had a PCN reaction occurring within the last 10 years: Yes If all of the above answers are "NO", then may proceed with Cephalosporin use.    Social History   Social History  . Marital Status: Single    Spouse Name: N/A  . Number of Children: N/A  . Years  of Education: N/A   Occupational History  . Not on file.   Social History Main Topics  . Smoking status: Never Smoker   . Smokeless tobacco: Never Used  . Alcohol Use: No  . Drug Use: No  . Sexual Activity: No   Other Topics Concern  . Not on file   Social History Narrative    History reviewed. No pertinent family history.  ROS- All systems are reviewed and negative except as per the HPI above  Physical Exam: Telemetry: Filed Vitals:   06/06/15 1437 06/06/15 1900 06/06/15 2122 06/07/15 0634  BP: 141/47  142/74 135/75  Pulse: 106  115 108  Temp: 97.5 F (36.4 C)  99.6 F (37.6 C) 98.1 F (36.7 C)  TempSrc: Oral  Oral Oral  Resp: 20  20 16   Height:      Weight:  309 lb 9.6 oz (140.434 kg)    SpO2: 95%  96% 97%    GEN- The patient is well  appearing, alert and oriented x 3 today.   Head- normocephalic, atraumatic Eyes-  Sclera clear, conjunctiva pink Ears- hearing intact Oropharynx- clear Neck- supple, no JVP Lymph- no cervical lymphadenopathy Lungs- Clear to ausculation bilaterally, normal work of breathing Heart- Regular rate and rhythm, no murmurs, rubs or gallops, PMI not laterally displaced GI- soft, NT, ND, + BS Extremities- no clubbing, cyanosis, or edema MS- no significant deformity or atrophy Skin- no rash or lesion Psych- euthymic mood, full affect Neuro- strength and sensation are intact  EKG-sinus tachycardia, right bundle branch block, left anterior fascicular block  Labs:   Lab Results  Component Value Date   WBC 16.5* 06/07/2015   HGB 10.0* 06/07/2015   HCT 30.3* 06/07/2015   MCV 87.6 06/07/2015   PLT 139* 06/07/2015    Recent Labs Lab 06/06/15 1104 06/07/15 0455  NA 134* 134*  K 4.5 3.6  CL 102 106  CO2 24 22  BUN 15 16  CREATININE 0.39* 0.40*  CALCIUM 9.5 8.7*  PROT 6.4*  --   BILITOT 1.8*  --   ALKPHOS 466*  --   ALT 98*  --   AST 159*  --   GLUCOSE 142* 119*   Lab Results  Component Value Date   TROPONINI <0.03 06/03/2015    Lab Results  Component Value Date   CHOL 197 06/03/2015   Lab Results  Component Value Date   HDL 24* 06/03/2015   Lab Results  Component Value Date   LDLCALC 136* 06/03/2015   Lab Results  Component Value Date   TRIG 186* 06/03/2015   Lab Results  Component Value Date   CHOLHDL 8.2 06/03/2015   No results found for: LDLDIRECT    Radiology: IMPRESSION: 1. Cardiac enlargement and low lung volumes. 2. Pulmonary vascular congestion.  Echo:- Left ventricle: The cavity size was normal. Systolic function was normal. The estimated ejection fraction was in the range of 60% to 65%. Wall motion was normal; there were no regional wall motion abnormalities. Doppler parameters are consistent with abnormal left ventricular relaxation  (grade 1 diastolic dysfunction). - Mitral valve: Calcified annulus. - Right ventricle: Systolic function was normal. - Pulmonary arteries: Systolic pressure was mildly elevated. PA peak pressure: 37 mm Hg (S).  ASSESSMENT AND PLAN:  1. Recurrent MSSA Sepsis: Patient was admitted to the hospital with confusion found to have continued MSSA bacteremia after a course of antibiotics. It is possible that this could be due to endocarditis. We'll therefore arrange for TEE potentially  tomorrow. I have made her nothing by mouth after midnight tonight.  Should her blood cultures return negative, or another sign of infection is found, she may not require TEE.     Will Meredith Leeds, MD 06/07/2015  12:45 PM

## 2015-06-07 NOTE — Progress Notes (Signed)
[place] at [place] NAME: Sharon Kirby    MR#:  [id number]  DATE OF BIRTH:  23-Apr-1951  SUBJECTIVE:  CHIEF COMPLAINT:   Chief Complaint  Patient presents with  . Altered Mental Status  . Tachycardia   Sleeping comfortably. Tearful on waking her up saying she hurts all over.  Confused REVIEW OF SYSTEMS:  Review of Systems  Constitutional: Negative for fever, weight loss, malaise/fatigue and diaphoresis.  HENT: Negative for ear discharge, ear pain, hearing loss, nosebleeds, sore throat and tinnitus.   Eyes: Negative for blurred vision and pain.  Respiratory: Negative for cough, hemoptysis, shortness of breath and wheezing.   Cardiovascular: Negative for chest pain, palpitations, orthopnea and leg swelling.  Gastrointestinal: Negative for heartburn, nausea, vomiting, abdominal pain, diarrhea, constipation and blood in stool.  Genitourinary: Negative for dysuria, urgency and frequency.  Musculoskeletal: Positive for myalgias, back pain, joint pain and neck pain.  Skin: Negative for itching and rash.  Neurological: Negative for dizziness, tingling, tremors, focal weakness, seizures, weakness and headaches.  Psychiatric/Behavioral: Negative for depression. The patient is not nervous/anxious.    DRUG ALLERGIES:   Allergies  Allergen Reactions  . Sulfa Antibiotics Anaphylaxis  . Penicillins Rash and Other (See Comments)    Has patient had a PCN reaction causing immediate rash, facial/tongue/throat swelling, SOB or lightheadedness with hypotension: No Has patient had a PCN reaction causing severe rash involving mucus membranes or skin necrosis: No Has patient had a PCN reaction that required hospitalization No Has patient had a PCN reaction occurring within the last 10 years: Yes If all of the above answers are "NO", then may proceed with Cephalosporin use.   VITALS:  Blood pressure 135/75, pulse 108, temperature 98.1 F (36.7  C), temperature source Oral, resp. rate 16, height 5\' 1"  (1.549 m), weight 140.434 kg (309 lb 9.6 oz), SpO2 97 %. PHYSICAL EXAMINATION:  Physical Exam   GENERAL:  64 y.o.-year-old patient lying in the bed , tearful. Morbidly obese. EYES: Pupils equal, round, reactive to light and accommodation. No scleral icterus. Extraocular muscles intact.  HEENT: Head atraumatic, normocephalic. Oropharynx and nasopharynx clear.  NECK:  Supple, no jugular venous distention. No thyroid enlargement, no tenderness.  LUNGS: Normal breath sounds bilaterally, no wheezing, rales, rhonchi. No use of accessory muscles of respiration.  CARDIOVASCULAR: S1, S2 normal. No murmurs, rubs, or gallops.  ABDOMEN: Soft, nontender, nondistended. Bowel sounds present. No organomegaly or mass. Rectal tube EXTREMITIES: No cyanosis, clubbing or edema b/l.    NEUROLOGIC: Cranial nerves II through XII are intact. No focal Motor or sensory deficits b/l.   PSYCHIATRIC: The patient is drowzy, confused. SKIN: No obvious rash, lesion, or ulcer.   RUE PICC Line  LABORATORY PANEL:   CBC  Recent Labs Lab 06/07/15 0455  WBC 16.5*  HGB 10.0*  HCT 30.3*  PLT 139*   ------------------------------------------------------------------------------------------------------------------ Chemistries   Recent Labs Lab 06/06/15 1104 06/07/15 0455  NA 134* 134*  K 4.5 3.6  CL 102 106  CO2 24 22  GLUCOSE 142* 119*  BUN 15 16  CREATININE 0.39* 0.40*  CALCIUM 9.5 8.7*  AST 159*  --   ALT 98*  --   ALKPHOS 466*  --   BILITOT 1.8*  --    RADIOLOGY:  Ct Head Wo Contrast  06/06/2015  CLINICAL DATA:  Pt presents from [facility] with c/o AMS. Per EMS report pt was d/c from hospital yesterday for sepsis and admitted to [facility].  Pt tachycardic upon arrival with incomprehensible speech. Daughter states pt history of brain tumor, seizures and stroke. EXAM: CT HEAD WITHOUT CONTRAST TECHNIQUE: Contiguous axial images  were obtained from the base of the skull through the vertex without intravenous contrast. COMPARISON:  05/11/2015 FINDINGS: Bilateral ventriculostomy is extend from the parietal bones to the anterior aspects of both lateral ventricles, without change. Ventricles are enlarged, similar to the prior study, consistent with diffuse atrophy. There is no convincing hydrocephalus. There are no parenchymal masses or mass effect. There is no evidence of a cortical infarct. There are no extra-axial masses or abnormal fluid collections. There is no intracranial hemorrhage. The visualized sinuses and mastoid air cells are clear. IMPRESSION: 1. No acute intracranial abnormalities. 2. Bilateral ventriculostomy is are stable. The degree of ventricular enlargement is stable. Electronically Signed   By: Lajean Manes M.D.   On: 06/06/2015 12:36   Dg Chest Port 1 View  06/06/2015  CLINICAL DATA:  Confusion. EXAM: PORTABLE CHEST 1 VIEW COMPARISON:  06/05/2015 FINDINGS: Rotational artifact. Right arm PICC line is noted with tip in the SVC. Heart size is moderately enlarged. The lung volumes are low. Pulmonary vascular congestion noted. There is no pleural effusion or edema. IMPRESSION: 1. Cardiac enlargement and low lung volumes. 2. Pulmonary vascular congestion. Electronically Signed   By: Kerby Moors M.D.   On: 06/06/2015 12:22   ASSESSMENT AND PLAN:  64 y.o. female with a known history of diabetic mellitus, hypertension, anxiety and depression and multiple other medical problems is brought into the ED due to fever, confusion. Recent admission for MSSA bacteremia  * GPCC bacteremia Recent MSSA bacteremia on Cefazolin thru PICC. Discharged on 11/18. Now on IV Vancomycin to cover also MRSA till final ID available Will request ID consult. Also request cardiology to consider TEE due to persistent staph bacteremia. Will check MRI Lumbar. Repeat Blood cx.  * Cirrhosis of Liver with hepatic encephalopathy On  lactulose  * Chronic pain syndrome  * Diabetes mellitus Continue sliding scale insulin  * Essential hypertension Continue home medication list no pedal and titrate as needed basis  * DVT prophylaxis with Heprain SQ  * History of depression and anxiety Continue home medications Klonopin   All the records are reviewed and case discussed with Care Management/Social Worker. Management plans discussed with the patient, family and they are in agreement.  CODE STATUS: Full Code  TOTAL TIME TAKING CARE OF THIS PATIENT: 35 minutes.    Hillary Bow R M.D on 06/07/2015 at 11:29 AM  Between 7am to 6pm - Pager - [phone]  After 6pm go to [email or web address] - password [email or web address] [facility]  Office  [phone]  CC: Primary care physician; Madelyn Brunner, MD

## 2015-06-08 ENCOUNTER — Encounter
Admission: RE | Admit: 2015-06-08 | Discharge: 2015-06-08 | Disposition: A | Discharge status: Home / Self Care | Payer: Commercial Managed Care - HMO | Source: Ambulatory Visit | Attending: Internal Medicine | Admitting: Internal Medicine

## 2015-06-08 ENCOUNTER — Inpatient Hospital Stay: Payer: Commercial Managed Care - HMO

## 2015-06-08 ENCOUNTER — Inpatient Hospital Stay (HOSPITAL_COMMUNITY)
Admit: 2015-06-08 | Discharge: 2015-06-08 | Disposition: A | Discharge status: Home / Self Care | Payer: Commercial Managed Care - HMO | Attending: Cardiology | Admitting: Cardiology

## 2015-06-08 ENCOUNTER — Encounter: Payer: Self-pay | Admitting: Radiology

## 2015-06-08 ENCOUNTER — Encounter
Admission: EM | Disposition: A | Discharge status: Skilled Nursing Facility | Payer: Self-pay | Source: Home / Self Care | Attending: Internal Medicine

## 2015-06-08 ENCOUNTER — Ambulatory Visit: Payer: Commercial Managed Care - HMO | Admitting: Pain Medicine

## 2015-06-08 DIAGNOSIS — A409 Streptococcal sepsis, unspecified: Secondary | ICD-10-CM

## 2015-06-08 DIAGNOSIS — E119 Type 2 diabetes mellitus without complications: Secondary | ICD-10-CM | POA: Insufficient documentation

## 2015-06-08 DIAGNOSIS — I330 Acute and subacute infective endocarditis: Secondary | ICD-10-CM

## 2015-06-08 LAB — GLUCOSE, CAPILLARY
GLUCOSE-CAPILLARY: 112 mg/dL — AB (ref 65–99)
GLUCOSE-CAPILLARY: 132 mg/dL — AB (ref 65–99)
Glucose-Capillary: 166 mg/dL — ABNORMAL HIGH (ref 65–99)

## 2015-06-08 LAB — CBC WITH DIFFERENTIAL/PLATELET
BASOS PCT: 0 %
Basophils Absolute: 0 10*3/uL (ref 0–0.1)
EOS ABS: 0.2 10*3/uL (ref 0–0.7)
EOS PCT: 1 %
HEMATOCRIT: 28.4 % — AB (ref 35.0–47.0)
HEMOGLOBIN: 9.4 g/dL — AB (ref 12.0–16.0)
LYMPHS PCT: 8 %
Lymphs Abs: 1.4 10*3/uL (ref 1.0–3.6)
MCH: 29.4 pg (ref 26.0–34.0)
MCHC: 33 g/dL (ref 32.0–36.0)
MCV: 89 fL (ref 80.0–100.0)
MONO ABS: 1.1 10*3/uL — AB (ref 0.2–0.9)
Monocytes Relative: 6 %
NEUTROS PCT: 85 %
Neutro Abs: 14.8 10*3/uL — ABNORMAL HIGH (ref 1.4–6.5)
Platelets: 139 10*3/uL — ABNORMAL LOW (ref 150–440)
RBC: 3.19 MIL/uL — AB (ref 3.80–5.20)
RDW: 25.1 % — ABNORMAL HIGH (ref 11.5–14.5)
WBC: 17.5 10*3/uL — AB (ref 3.6–11.0)

## 2015-06-08 LAB — URINE CULTURE: Culture: NO GROWTH

## 2015-06-08 LAB — AMMONIA: AMMONIA: 43 umol/L — AB (ref 9–35)

## 2015-06-08 SURGERY — ECHOCARDIOGRAM, TRANSESOPHAGEAL
Anesthesia: Moderate Sedation

## 2015-06-08 MED ORDER — MIDAZOLAM HCL 5 MG/5ML IJ SOLN
INTRAMUSCULAR | Status: AC
  Filled 2015-06-08: qty 10

## 2015-06-08 MED ORDER — BUTAMBEN-TETRACAINE-BENZOCAINE 2-2-14 % EX AERO
INHALATION_SPRAY | CUTANEOUS | Status: AC
  Filled 2015-06-08: qty 20

## 2015-06-08 MED ORDER — DEXTROSE 5 % IV SOLN: 1.0000 g | INTRAVENOUS | Status: DC

## 2015-06-08 MED ORDER — CEFAZOLIN SODIUM-DEXTROSE 2-3 GM-% IV SOLR
2.0000 g | Freq: Three times a day (TID) | INTRAVENOUS | Status: DC
  Administered 2015-06-08 – 2015-06-15 (×22): 2 g via INTRAVENOUS
  Filled 2015-06-08 (×27): qty 50

## 2015-06-08 MED ORDER — MIDAZOLAM HCL 2 MG/2ML IJ SOLN
INTRAMUSCULAR | Status: AC | PRN
  Administered 2015-06-08: 1 mg via INTRAVENOUS

## 2015-06-08 MED ORDER — LIDOCAINE VISCOUS 2 % MT SOLN
OROMUCOSAL | Status: AC
  Filled 2015-06-08: qty 15

## 2015-06-08 MED ORDER — FENTANYL CITRATE (PF) 100 MCG/2ML IJ SOLN
INTRAMUSCULAR | Status: AC | PRN
  Administered 2015-06-08: 25 ug via INTRAVENOUS

## 2015-06-08 MED ORDER — IOHEXOL 350 MG/ML SOLN
100.0000 mL | Freq: Once | INTRAVENOUS | Status: AC | PRN
  Administered 2015-06-08: 100 mL via INTRAVENOUS

## 2015-06-08 MED ORDER — LORAZEPAM 2 MG/ML IJ SOLN
2.0000 mg | Freq: Once | INTRAMUSCULAR | Status: AC
  Administered 2015-06-08: 2 mg via INTRAVENOUS
  Filled 2015-06-08: qty 1

## 2015-06-08 MED ORDER — FENTANYL CITRATE (PF) 100 MCG/2ML IJ SOLN
INTRAMUSCULAR | Status: AC
  Filled 2015-06-08: qty 4

## 2015-06-08 NOTE — Progress Notes (Signed)
PT Attempt Note  Patient Details Name: [name] MRN: [id number] DOB: 07-10-1951   Cancelled Treatment:    Reason Eval/Treat Not Completed: Medical issues which prohibited therapy. Chart reviewed and RN consulted. Attempted to treat patient. History obtained from daughter and when sheets removed pt is covered in loose stool. Stool on sheets and on floor. RN and CNA notified. Daughter notified therapist will return to attempt evaluation once pt is cleaned. Evaluation will be attempted again tomorrow.  Lyndel Safe Charolett Yarrow PT, DPT   Jasmin Winberry 06/08/2015, 4:51 PM

## 2015-06-08 NOTE — Progress Notes (Signed)
[place] at [place] NAME: Sharon Kirby    MR#:  [id number]  DATE OF BIRTH:  1951-05-05  SUBJECTIVE:  CHIEF COMPLAINT:   Chief Complaint  Patient presents with  . Altered Mental Status  . Tachycardia   Sleeping comfortably. Tearful on waking her up saying she hurts all over.  Confused. Son at bedside. REVIEW OF SYSTEMS:  Review of Systems  Constitutional: Negative for fever, weight loss, malaise/fatigue and diaphoresis.  HENT: Negative for ear discharge, ear pain, hearing loss, nosebleeds, sore throat and tinnitus.   Eyes: Negative for blurred vision and pain.  Respiratory: Negative for cough, hemoptysis, shortness of breath and wheezing.   Cardiovascular: Negative for chest pain, palpitations, orthopnea and leg swelling.  Gastrointestinal: Negative for heartburn, nausea, vomiting, abdominal pain, diarrhea, constipation and blood in stool.  Genitourinary: Negative for dysuria, urgency and frequency.  Musculoskeletal: Positive for myalgias, back pain, joint pain and neck pain.  Skin: Negative for itching and rash.  Neurological: Negative for dizziness, tingling, tremors, focal weakness, seizures, weakness and headaches.  Psychiatric/Behavioral: Negative for depression. The patient is not nervous/anxious.    DRUG ALLERGIES:   Allergies  Allergen Reactions  . Sulfa Antibiotics Anaphylaxis  . Penicillins Rash and Other (See Comments)    Has patient had a PCN reaction causing immediate rash, facial/tongue/throat swelling, SOB or lightheadedness with hypotension: No Has patient had a PCN reaction causing severe rash involving mucus membranes or skin necrosis: No Has patient had a PCN reaction that required hospitalization No Has patient had a PCN reaction occurring within the last 10 years: Yes If all of the above answers are "NO", then may proceed with Cephalosporin use.   VITALS:  Blood pressure 154/87, pulse 102,  temperature 98.8 F (37.1 C), temperature source Oral, resp. rate 16, height 5\' 1"  (1.549 m), weight 140.434 kg (309 lb 9.6 oz), SpO2 99 %. PHYSICAL EXAMINATION:  Physical Exam   GENERAL:  64 y.o.-year-old patient lying in the bed , tearful. Morbidly obese. EYES: Pupils equal, round, reactive to light and accommodation. No scleral icterus. Extraocular muscles intact.  HEENT: Head atraumatic, normocephalic. Oropharynx and nasopharynx clear.  NECK:  Supple, no jugular venous distention. No thyroid enlargement, no tenderness.  LUNGS: Normal breath sounds bilaterally, no wheezing, rales, rhonchi. No use of accessory muscles of respiration.  CARDIOVASCULAR: S1, S2 normal. No murmurs, rubs, or gallops.  ABDOMEN: Soft, nontender, nondistended. Bowel sounds present. No organomegaly or mass. Rectal tube EXTREMITIES: No cyanosis, clubbing or edema b/l.    NEUROLOGIC: Cranial nerves II through XII are intact. No focal Motor or sensory deficits b/l.   PSYCHIATRIC: The patient is drowzy, confused. SKIN: No obvious rash, lesion, or ulcer.   RUE PICC Line  LABORATORY PANEL:   CBC  Recent Labs Lab 06/08/15 0504  WBC 17.5*  HGB 9.4*  HCT 28.4*  PLT 139*   ------------------------------------------------------------------------------------------------------------------ Chemistries   Recent Labs Lab 06/06/15 1104 06/07/15 0455  NA 134* 134*  K 4.5 3.6  CL 102 106  CO2 24 22  GLUCOSE 142* 119*  BUN 15 16  CREATININE 0.39* 0.40*  CALCIUM 9.5 8.7*  AST 159*  --   ALT 98*  --   ALKPHOS 466*  --   BILITOT 1.8*  --    RADIOLOGY:  No results found. ASSESSMENT AND PLAN:  64 y.o. female with a known history of diabetic mellitus, hypertension, anxiety and depression and multiple other medical problems is brought into the ED  due to fever, confusion. Recent admission for MSSA bacteremia  * GPCC bacteremia Recent MSSA bacteremia on Cefazolin thru PICC. Discharged on 11/18. Now on IV  Vancomycin to cover also MRSA till final ID available. Will request ID consult. Discussed with Dr. Ola Spurr TEE showed no endocarditis. MRI lumbar could not be done due to her obesity. Will check CT lumbar spine W contrast  * Cirrhosis of Liver with hepatic encephalopathy On lactulose  * Chronic pain syndrome  * Diabetes mellitus Continue sliding scale insulin  * Essential hypertension Continue home medication list no pedal and titrate as needed basis  * DVT prophylaxis with Heprain SQ  * History of depression and anxiety Continue home medications Klonopin   All the records are reviewed and case discussed with Care Management/Social Worker. Management plans discussed with the patient, family and they are in agreement.  CODE STATUS: Full Code  TOTAL TIME TAKING CARE OF THIS PATIENT: 35 minutes.    Hillary Bow R M.D on 06/08/2015 at 12:26 PM  Between 7am to 6pm - Pager - [phone]  After 6pm go to [email or web address] - password [email or web address] [place] [facility]  Office  [phone]  CC: Primary care physician; Madelyn Brunner, MD

## 2015-06-08 NOTE — Consult Note (Signed)
[facility] Infectious Disease     Reason for Consult:GPC bacteremia    Referring Physician: Max Sane Date of Admission:  06/06/2015   Principal Problem:   Sepsis ([place]) Active Problems:   Bacterial endocarditis   HPI: Sharon Kirby is a 64 y.o. female with DM, Htn, anxiety, depression initially admitted with falls and Low back pain as well as + troponins and wbc 12.7. She spiked temp to 102.3 and bcx done grew MS S aureus.  CT chest shows no PE but some vol loss and cirrhosis. Likely source was cellulitis in thigh. She did not have MRI done of back. Dced on IV ancef for planned 2-6 week course. She was readmitted soon after dc with confusion and tachycardia. Temp was elevated and fu BCX are again +.  Picc was placed 11/18.  Bcx done 11/16 just turned + 11/21.   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  She also has hx of known history of obstructive sleep apnea noncompliant to CPAP, chronic left shoulder pain, GERD, morbid , history of seizures, history of brain tumor, anxiety depression panic attacks   Past Medical History  Diagnosis Date  . Seizures ([place])   . Stroke ([place])   . Brain tumor ([place])   . Diabetes mellitus without complication ([place])   . Arthritis   . Sleep apnea   . GERD (gastroesophageal reflux disease)   . Asthma   . Long QT interval   . Hypertension   . Panic attacks   . Anxiety   . Depression   . Syncope    Past Surgical History  Procedure Laterality Date  . Csf shunt      x2 due to tumor  . Cholecystectomy    . Tonsillectomy    . Carpal tunnel release Left   . Trigger finger release Left   . Knee surgery Left   . Colonoscopy with propofol N/A 04/10/2015    Procedure: COLONOSCOPY WITH PROPOFOL;  Surgeon: Lucilla Lame, MD;  Location: [facility] ENDOSCOPY;  Service: Endoscopy;  Laterality: N/A;  . Esophagogastroduodenoscopy (egd) with propofol N/A 04/10/2015    Procedure: ESOPHAGOGASTRODUODENOSCOPY  (EGD) WITH PROPOFOL;  Surgeon: Lucilla Lame, MD;  Location: [facility] ENDOSCOPY;  Service: Endoscopy;  Laterality: N/A;   Social History  Substance Use Topics  . Smoking status: Never Smoker   . Smokeless tobacco: Never Used  . Alcohol Use: No   History reviewed. No pertinent family history.  Allergies:  Allergies  Allergen Reactions  . Sulfa Antibiotics Anaphylaxis  . Penicillins Rash and Other (See Comments)    Has patient had a PCN reaction causing immediate rash, facial/tongue/throat swelling, SOB or lightheadedness with hypotension: No Has patient had a PCN reaction causing severe rash involving mucus membranes or skin necrosis: No Has patient had a PCN reaction that required hospitalization No Has patient had a PCN reaction occurring within the last 10 years: Yes If all of the above answers are "NO", then may proceed with Cephalosporin use.    Current antibiotics: Antibiotics Given (last 72 hours)    Date/Time Action Medication Dose Rate   06/06/15 1454 Given   vancomycin (VANCOCIN) 1,500 mg in sodium chloride 0.9 % 500 mL IVPB 1,500 mg 250 mL/hr   06/06/15 1750 Given   ceFAZolin (ANCEF) IVPB 2 g/50 mL premix 2 g 100 mL/hr   06/06/15 2129 Given   ceFAZolin (ANCEF) IVPB 2 g/50 mL premix 2 g 100 mL/hr   06/07/15  0000 Given   vancomycin (VANCOCIN) IVPB 1000 mg/200 mL premix 1,000 mg 200 mL/hr   06/07/15 0513 Given   ceFAZolin (ANCEF) IVPB 2 g/50 mL premix 2 g 100 mL/hr   06/07/15 0844 Given   cefTRIAXone (ROCEPHIN) 1 g in dextrose 5 % 50 mL IVPB 1 g 100 mL/hr   06/07/15 1208 Given   vancomycin (VANCOCIN) IVPB 1000 mg/200 mL premix 1,000 mg 200 mL/hr   06/07/15 2344 Given   vancomycin (VANCOCIN) IVPB 1000 mg/200 mL premix 1,000 mg 200 mL/hr   06/08/15 1143 Given   vancomycin (VANCOCIN) IVPB 1000 mg/200 mL premix 1,000 mg 200 mL/hr      MEDICATIONS: . aspirin EC  81 mg Oral Daily  . butamben-tetracaine-benzocaine      . [START ON 06/09/2015] cefTRIAXone (ROCEPHIN)  IV  1 g  Intravenous Q24H  . clonazePAM  1 mg Oral BID  . fentaNYL      . ferrous sulfate  325 mg Oral BID WC  . heparin  5,000 Units Subcutaneous 3 times per day  . insulin aspart  0-5 Units Subcutaneous QHS  . insulin aspart  0-9 Units Subcutaneous TID WC  . lactulose  40 g Oral TID  . lisinopril  10 mg Oral Daily  . loratadine  10 mg Oral Daily  . midazolam      . pantoprazole  40 mg Oral Daily  . PARoxetine  40 mg Oral Daily  . vancomycin  1,000 mg Intravenous Q12H  . zinc gluconate  50 mg Oral Daily    Review of Systems - 11 systems reviewed and negative per HPI   OBJECTIVE: Temp:  [98.2 F (36.8 C)-98.9 F (37.2 C)] 98.2 F (36.8 C) (11/21 1244) Pulse Rate:  [98-110] 103 (11/21 1244) Resp:  [16-21] 17 (11/21 1244) BP: (136-175)/(60-95) 156/87 mmHg (11/21 1244) SpO2:  [95 %-100 %] 99 % (11/21 1244) Physical Exam  Constitutional: morbidly obese, lying in bed, awake, able to converse, knows at [facility].  HENT: [place]/AT, PERRLA, no scleral icterus Mouth/Throat: Oropharynx is clear and dry . No oropharyngeal exudate.  Cardiovascular: Normal rate, regular rhythm and normal heart sounds.  Pulmonary/Chest: Effort normal and breath sounds normal. No respiratory distress.  has no wheezes.  Neck  supple, no nuchal rigidity Abdominal: obese Soft. Bowel sounds are normal.  exhibits no distension. There is no tenderness.  Lymphadenopathy: no cervical adenopathy. No axillary adenopathy Neurological: alert and interactive Skin: brusiing on arms, legs Has skin breakdown in groin and upper thighs Psychiatric: more awake than priro PICC RUE  LABS: Results for orders placed or performed during the hospital encounter of 06/06/15 (from the past 48 hour(s))  Glucose, capillary     Status: Abnormal   Collection Time: 06/06/15  4:19 PM  Result Value Ref Range   Glucose-Capillary 110 (H) 65 - 99 mg/dL   Comment 1 Notify RN   Lactic acid, plasma     Status: Abnormal   Collection Time: 06/06/15  4:27  PM  Result Value Ref Range   Lactic Acid, Venous 2.2 (HH) 0.5 - 2.0 mmol/L    Comment: CRITICAL RESULT CALLED TO, READ BACK BY AND VERIFIED WITH  GENA DENTON AT 7858 06/06/15 SDR   Glucose, capillary     Status: Abnormal   Collection Time: 06/06/15  9:22 PM  Result Value Ref Range   Glucose-Capillary 114 (H) 65 - 99 mg/dL   Comment 1 Notify RN   Basic metabolic panel     Status: Abnormal  Collection Time: 06/07/15  4:55 AM  Result Value Ref Range   Sodium 134 (L) 135 - 145 mmol/L   Potassium 3.6 3.5 - 5.1 mmol/L   Chloride 106 101 - 111 mmol/L   CO2 22 22 - 32 mmol/L   Glucose, Bld 119 (H) 65 - 99 mg/dL   BUN 16 6 - 20 mg/dL   Creatinine, Ser 0.40 (L) 0.44 - 1.00 mg/dL   Calcium 8.7 (L) 8.9 - 10.3 mg/dL   GFR calc non Af Amer >60 >60 mL/min   GFR calc Af Amer >60 >60 mL/min    Comment: (NOTE) The eGFR has been calculated using the CKD EPI equation. This calculation has not been validated in all clinical situations. eGFR's persistently <60 mL/min signify possible Chronic Kidney Disease.    Anion gap 6 5 - 15  CBC     Status: Abnormal   Collection Time: 06/07/15  4:55 AM  Result Value Ref Range   WBC 16.5 (H) 3.6 - 11.0 K/uL   RBC 3.46 (L) 3.80 - 5.20 MIL/uL   Hemoglobin 10.0 (L) 12.0 - 16.0 g/dL   HCT 30.3 (L) 35.0 - 47.0 %   MCV 87.6 80.0 - 100.0 fL   MCH 28.8 26.0 - 34.0 pg   MCHC 32.9 32.0 - 36.0 g/dL   RDW 26.0 (H) 11.5 - 14.5 %   Platelets 139 (L) 150 - 440 K/uL  Glucose, capillary     Status: Abnormal   Collection Time: 06/07/15  7:33 AM  Result Value Ref Range   Glucose-Capillary 106 (H) 65 - 99 mg/dL  Lactic acid, plasma     Status: None   Collection Time: 06/07/15  8:59 AM  Result Value Ref Range   Lactic Acid, Venous 1.3 0.5 - 2.0 mmol/L  Glucose, capillary     Status: Abnormal   Collection Time: 06/07/15 11:33 AM  Result Value Ref Range   Glucose-Capillary 146 (H) 65 - 99 mg/dL  Glucose, capillary     Status: Abnormal   Collection Time: 06/07/15   5:01 PM  Result Value Ref Range   Glucose-Capillary 159 (H) 65 - 99 mg/dL   Comment 1 Notify RN   Glucose, capillary     Status: Abnormal   Collection Time: 06/07/15  9:23 PM  Result Value Ref Range   Glucose-Capillary 128 (H) 65 - 99 mg/dL   Comment 1 Notify RN   CBC with Differential/Platelet     Status: Abnormal   Collection Time: 06/08/15  5:04 AM  Result Value Ref Range   WBC 17.5 (H) 3.6 - 11.0 K/uL   RBC 3.19 (L) 3.80 - 5.20 MIL/uL   Hemoglobin 9.4 (L) 12.0 - 16.0 g/dL   HCT 28.4 (L) 35.0 - 47.0 %   MCV 89.0 80.0 - 100.0 fL   MCH 29.4 26.0 - 34.0 pg   MCHC 33.0 32.0 - 36.0 g/dL   RDW 25.1 (H) 11.5 - 14.5 %   Platelets 139 (L) 150 - 440 K/uL   Neutrophils Relative % 85 %   Lymphocytes Relative 8 %   Monocytes Relative 6 %   Eosinophils Relative 1 %   Basophils Relative 0 %   Neutro Abs 14.8 (H) 1.4 - 6.5 K/uL   Lymphs Abs 1.4 1.0 - 3.6 K/uL   Monocytes Absolute 1.1 (H) 0.2 - 0.9 K/uL   Eosinophils Absolute 0.2 0 - 0.7 K/uL   Basophils Absolute 0.0 0 - 0.1 K/uL  Ammonia     Status: Abnormal  Collection Time: 06/08/15  5:04 AM  Result Value Ref Range   Ammonia 43 (H) 9 - 35 umol/L  Glucose, capillary     Status: Abnormal   Collection Time: 06/08/15 11:30 AM  Result Value Ref Range   Glucose-Capillary 112 (H) 65 - 99 mg/dL   No components found for: ESR, C REACTIVE PROTEIN MICRO: Recent Results (from the past 720 hour(s))  Urine culture     Status: None   Collection Time: 06/02/15  6:38 PM  Result Value Ref Range Status   Specimen Description URINE, RANDOM  Final   Special Requests NONE  Final   Culture 30,000 COLONIES/mL STAPHYLOCOCCUS AUREUS  Final   Report Status 06/05/2015 FINAL  Final   Organism ID, Bacteria STAPHYLOCOCCUS AUREUS  Final      Susceptibility   Staphylococcus aureus - MIC*    CIPROFLOXACIN >=8 RESISTANT Resistant     ERYTHROMYCIN 4 RESISTANT Resistant     GENTAMICIN <=0.5 SENSITIVE Sensitive     OXACILLIN 1 SENSITIVE Sensitive      TRIMETH/SULFA <=10 SENSITIVE Sensitive     CLINDAMYCIN <=0.25 RESISTANT Resistant     CEFOXITIN SCREEN NEGATIVE Sensitive     Inducible Clindamycin Value in next row Resistant      POSITIVEINDUCIBLE CLINDAMYCIN RESISTANCE - A positive ICR test is indicative of inducible resistance to macrolides, lincosamides, and type B streptogramin.  This isolate is presumed to be resistant to Clindamycin, however, Clindamycin may still be effective in some patients.     TETRACYCLINE Value in next row Sensitive      SENSITIVE<=1    * 30,000 COLONIES/mL STAPHYLOCOCCUS AUREUS  Blood culture (routine x 2)     Status: None   Collection Time: 06/02/15  6:39 PM  Result Value Ref Range Status   Specimen Description BLOOD RIGHT ARM  Final   Special Requests BOTTLES DRAWN AEROBIC AND ANAEROBIC 4CC  Final   Culture  Setup Time   Final    GRAM POSITIVE COCCI IN CLUSTERS IN BOTH AEROBIC AND ANAEROBIC BOTTLES CRITICAL RESULT CALLED TO, READ BACK BY AND VERIFIED WITH: DONEESHA ROBERTSON 0932 ON 06/03/15 CTJ    Culture   Final    STAPHYLOCOCCUS AUREUS IN BOTH AEROBIC AND ANAEROBIC BOTTLES    Report Status 06/05/2015 FINAL  Final   Organism ID, Bacteria STAPHYLOCOCCUS AUREUS  Final      Susceptibility   Staphylococcus aureus - MIC*    CIPROFLOXACIN >=8 RESISTANT Resistant     ERYTHROMYCIN 4 RESISTANT Resistant     GENTAMICIN <=0.5 SENSITIVE Sensitive     OXACILLIN <=0.25 SENSITIVE Sensitive     TRIMETH/SULFA <=10 SENSITIVE Sensitive     CLINDAMYCIN <=0.25 RESISTANT Resistant     CEFOXITIN SCREEN NEGATIVE Sensitive     Inducible Clindamycin Value in next row Resistant      POSITIVEINDUCIBLE CLINDAMYCIN RESISTANCE - A positive ICR test is indicative of inducible resistance to macrolides, lincosamides, and type B streptogramin.  This isolate is presumed to be resistant to Clindamycin, however, Clindamycin may still be effective in some patients.     TETRACYCLINE Value in next row Sensitive      SENSITIVE<=1    *  STAPHYLOCOCCUS AUREUS  Blood culture (routine x 2)     Status: None   Collection Time: 06/02/15  6:39 PM  Result Value Ref Range Status   Specimen Description BLOOD RIGHT ASSIST CONTROL  Final   Special Requests BOTTLES DRAWN AEROBIC AND ANAEROBIC 4CC  Final   Culture  Setup Time  Final    GRAM POSITIVE COCCI IN CLUSTERS IN BOTH AEROBIC AND ANAEROBIC BOTTLES CRITICAL VALUE NOTED.  VALUE IS CONSISTENT WITH PREVIOUSLY REPORTED AND CALLED VALUE.    Culture   Final    STAPHYLOCOCCUS AUREUS IN BOTH AEROBIC AND ANAEROBIC BOTTLES    Report Status 06/05/2015 FINAL  Final   Organism ID, Bacteria STAPHYLOCOCCUS AUREUS  Final      Susceptibility   Staphylococcus aureus - MIC*    CIPROFLOXACIN >=8 RESISTANT Resistant     ERYTHROMYCIN 4 RESISTANT Resistant     GENTAMICIN <=0.5 SENSITIVE Sensitive     OXACILLIN <=0.25 SENSITIVE Sensitive     TRIMETH/SULFA <=10 SENSITIVE Sensitive     CLINDAMYCIN <=0.25 RESISTANT Resistant     CEFOXITIN SCREEN NEGATIVE Sensitive     Inducible Clindamycin Value in next row Resistant      POSITIVEINDUCIBLE CLINDAMYCIN RESISTANCE - A positive ICR test is indicative of inducible resistance to macrolides, lincosamides, and type B streptogramin.  This isolate is presumed to be resistant to Clindamycin, however, Clindamycin may still be effective in some patients.     TETRACYCLINE Value in next row Sensitive      SENSITIVE<=1    * STAPHYLOCOCCUS AUREUS  Culture, blood (routine x 2)     Status: None (Preliminary result)   Collection Time: 06/03/15  3:34 PM  Result Value Ref Range Status   Specimen Description BLOOD LEFT ARM  Final   Special Requests BOTTLES DRAWN AEROBIC AND ANAEROBIC 4CC  Final   Culture  Setup Time   Final    GRAM POSITIVE COCCI ANAEROBIC BOTTLE ONLY CRITICAL RESULT CALLED TO, READ BACK BY AND VERIFIED WITH: RN ANABELLA ROBINSON 06/08/15 0800    Culture   Final    GRAM POSITIVE COCCI ANAEROBIC BOTTLE ONLY IDENTIFICATION TO FOLLOW    Report  Status PENDING  Incomplete  Culture, blood (routine x 2)     Status: None (Preliminary result)   Collection Time: 06/03/15  3:46 PM  Result Value Ref Range Status   Specimen Description BLOOD LEFT ARM  Final   Special Requests BOTTLES DRAWN AEROBIC AND ANAEROBIC 4CC  Final   Culture NO GROWTH 4 DAYS  Final   Report Status PENDING  Incomplete  Culture, blood (routine x 2)     Status: None (Preliminary result)   Collection Time: 06/06/15 11:05 AM  Result Value Ref Range Status   Specimen Description BLOOD RIGHT PICC  Final   Special Requests   Final    BOTTLES DRAWN AEROBIC AND ANAEROBIC ANAEROBIC 3ML AEROBIC 6ML   Culture  Setup Time   Final    GRAM POSITIVE COCCI IN CLUSTERS IN BOTH AEROBIC AND ANAEROBIC BOTTLES CRITICAL RESULT CALLED TO, READ BACK BY AND VERIFIED WITH: EMILY HARRON AT 0915 ON 06/07/15 CTJ    Culture   Final    STAPHYLOCOCCUS AUREUS IN BOTH AEROBIC AND ANAEROBIC BOTTLES SUSCEPTIBILITIES TO FOLLOW    Report Status PENDING  Incomplete  Urine culture     Status: None   Collection Time: 06/06/15 11:05 AM  Result Value Ref Range Status   Specimen Description URINE, RANDOM  Final   Special Requests NONE  Final   Culture NO GROWTH 2 DAYS  Final   Report Status 06/08/2015 FINAL  Final  Culture, blood (routine x 2)     Status: None (Preliminary result)   Collection Time: 06/06/15 11:48 AM  Result Value Ref Range Status   Specimen Description BLOOD LEFT [facility]  Final   Special  Requests   Final    BOTTLES DRAWN AEROBIC AND ANAEROBIC  AER 3CC ANA 1CC   Culture  Setup Time   Final    GRAM POSITIVE COCCI IN CLUSTERS IN BOTH AEROBIC AND ANAEROBIC BOTTLES CRITICAL RESULT CALLED TO, READ BACK BY AND VERIFIED WITH: EMILY HARRON AT 5643 ON 06/07/15 CTJ    Culture   Final    STAPHYLOCOCCUS AUREUS IN BOTH AEROBIC AND ANAEROBIC BOTTLES SUSCEPTIBILITIES TO FOLLOW    Report Status PENDING  Incomplete  C difficile quick scan w PCR reflex     Status: None   Collection Time:  06/06/15 11:48 AM  Result Value Ref Range Status   C Diff antigen NEGATIVE NEGATIVE Final   C Diff toxin NEGATIVE NEGATIVE Final   C Diff interpretation Negative for C. difficile  Final    IMAGING: Dg Chest 1 View  06/05/2015  CLINICAL DATA:  Right-sided PICC placement EXAM: CHEST 1 VIEW COMPARISON:  06/02/2015 FINDINGS: Right PICC tip projects in the lower superior vena cava, well positioned. Lung volumes remain low. There is mild basilar atelectasis. No convincing pneumonia or edema. No pleural effusion or pneumothorax. Cardiac silhouette is mildly enlarged. IMPRESSION: 1. Right PICC is well positioned with its tip in the lower superior vena cava. 2. No acute findings in the lungs. No other change from the prior study. Electronically Signed   By: Lajean Manes M.D.   On: 06/05/2015 10:49   Dg Chest 2 View  06/02/2015  CLINICAL DATA:  Back pain, chronic right leg pain EXAM: CHEST  2 VIEW COMPARISON:  None. FINDINGS: Cardiomegaly is noted. There is elevation of the right hemidiaphragm. No acute infiltrate or pleural effusion. No pulmonary edema. Mild degenerative changes mid and lower thoracic spine. IMPRESSION: Cardiomegaly. No active disease. Elevation of the right hemidiaphragm. Electronically Signed   By: Lahoma Crocker M.D.   On: 06/02/2015 14:58   Dg Lumbar Spine Complete  06/02/2015  CLINICAL DATA:  Back pain, chronic right leg pain EXAM: LUMBAR SPINE - COMPLETE 4+ VIEW COMPARISON:  None. FINDINGS: Four views of lumbar spine submitted. There is moderate disc space flattening at L5-S1 level. About 8 mm anterolisthesis L5 on S1 vertebral body. Facet degenerative changes L4 and L5 level. Disc space flattening with mild anterior spurring and endplate sclerotic changes at T12-L1 level. No acute fracture. Moderate gas throughout the colon. IMPRESSION: No acute fracture. There is about 8 mm anterolisthesis L5 on S1 vertebral body. Disc space flattening at T12-L1 and L5-S1 level. Electronically Signed    By: Lahoma Crocker M.D.   On: 06/02/2015 15:00   Ct Head Wo Contrast  06/06/2015  CLINICAL DATA:  Pt presents from [facility] with c/o AMS. Per EMS report pt was d/c from hospital yesterday for sepsis and admitted to [facility]. Pt tachycardic upon arrival with incomprehensible speech. Daughter states pt history of brain tumor, seizures and stroke. EXAM: CT HEAD WITHOUT CONTRAST TECHNIQUE: Contiguous axial images were obtained from the base of the skull through the vertex without intravenous contrast. COMPARISON:  05/11/2015 FINDINGS: Bilateral ventriculostomy is extend from the parietal bones to the anterior aspects of both lateral ventricles, without change. Ventricles are enlarged, similar to the prior study, consistent with diffuse atrophy. There is no convincing hydrocephalus. There are no parenchymal masses or mass effect. There is no evidence of a cortical infarct. There are no extra-axial masses or abnormal fluid collections. There is no intracranial hemorrhage. The visualized sinuses and mastoid air cells are clear. IMPRESSION: 1.  No acute intracranial abnormalities. 2. Bilateral ventriculostomy is are stable. The degree of ventricular enlargement is stable. Electronically Signed   By: Lajean Manes M.D.   On: 06/06/2015 12:36   Ct Head Wo Contrast  05/11/2015  CLINICAL DATA:  64 year old female with previous shunt placement and new daily persistent headaches. Initial encounter. EXAM: CT HEAD WITHOUT CONTRAST TECHNIQUE: Contiguous axial images were obtained from the base of the skull through the vertex without intravenous contrast. COMPARISON:  Noncontrast head CT 04/14/2012. FINDINGS: Bilateral posterior ventriculostomy catheters track through the lateral ventricles and appears stable in configuration since 2013. In the occipital scalp soft tissues in the midline these appear connected to a right cisterna magna catheter which courses into the dorsal left CSF space at the level of the  cervicomedullary junction. The tip of this catheter is not included, but visualized portions appear stable since 2013. Lateral ventricle size and configuration is stable. The temporal horns are decompressed as before. The third and fourth ventricles are decompressed as before. The cerebral aqueduct appears stable. No intracranial mass or mass effect identified. Dystrophic basal ganglia and right cerebellar nucleus calcifications greater on the left are stable. No acute intracranial hemorrhage identified. No cortically based acute infarct identified. Calcified atherosclerosis at the skull base. Gray-white matter differentiation is stable and within normal limits. Stable visualized osseous structures. Visualized paranasal sinuses and mastoids are clear. Stable visualized orbit and scalp soft tissues. IMPRESSION: Stable CT appearance of the brain since 2013. No acute intracranial abnormality. Electronically Signed   By: Genevie Ann M.D.   On: 05/11/2015 15:55   Ct Angio Chest Pe W/cm &/or Wo Cm  06/02/2015  CLINICAL DATA:  Shortness of breath with elevated troponin and back pain. EXAM: CT ANGIOGRAPHY CHEST WITH CONTRAST TECHNIQUE: Multidetector CT imaging of the chest was performed using the standard protocol during bolus administration of intravenous contrast. Multiplanar CT image reconstructions and MIPs were obtained to evaluate the vascular anatomy. CONTRAST:  93m OMNIPAQUE IOHEXOL 350 MG/ML SOLN COMPARISON:  Chest radiograph 06/02/2015 FINDINGS: No evidence for a pulmonary embolism. No significant pericardial or pleural fluid. Subcarinal tissue measures up to 1.3 cm in the short axis. Otherwise, there is no significant chest lymphadenopathy. There is a small amount of perihepatic ascites and the liver has a nodular contour. In addition, the spleen is enlarged measuring 18.2 cm in the AP dimension. Gallbladder has been removed. Small lymph nodes versus varices in the gastrohepatic ligament region. Cannot exclude  small esophageal varices. Limited evaluation of the portal venous system on this examination. The trachea and mainstem bronchi are patent. Volume loss in the right lower lobe. There is also volume loss in the left lower lobe. No significant airspace disease or consolidation in the lungs. Degenerative changes at the glenohumeral joints are incompletely visualized. No acute bone abnormality. Review of the MIP images confirms the above findings. IMPRESSION: No evidence for a pulmonary embolism. Volume loss in the lower lobes, right side greater the left. Cirrhosis with portal hypertension. Portal hypertension demonstrated by perihepatic ascites, splenomegaly and possible varices. Electronically Signed   By: AMarkus DaftM.D.   On: 06/02/2015 17:22   Dg Chest Port 1 View  06/06/2015  CLINICAL DATA:  Confusion. EXAM: PORTABLE CHEST 1 VIEW COMPARISON:  06/05/2015 FINDINGS: Rotational artifact. Right arm PICC line is noted with tip in the SVC. Heart size is moderately enlarged. The lung volumes are low. Pulmonary vascular congestion noted. There is no pleural effusion or edema. IMPRESSION: 1. Cardiac enlargement and low  lung volumes. 2. Pulmonary vascular congestion. Electronically Signed   By: Kerby Moors M.D.   On: 06/06/2015 12:22   Dg Knee Complete 4 Views Right  05/31/2015  CLINICAL DATA:  Fall onto right knee 3 days ago with severe pain with weight-bearing. Initial encounter. EXAM: RIGHT KNEE - COMPLETE 4+ VIEW COMPARISON:  None. FINDINGS: A total knee arthroplasty is well seated. No periprosthetic fracture or malalignment. No evidence of joint effusion. Osteopenic appearance of the bones. IMPRESSION: 1. No acute finding. 2. Unremarkable total knee arthroplasty. Electronically Signed   By: Monte Fantasia M.D.   On: 05/31/2015 04:21    Assessment:   Sharon Kirby is a 64 y.o. female with DM, Htn, anxiety, depression initially admitted with falls and Low back pain as well as + troponins and wbc 12.7.  She spiked temp to 102.3 and bcx done grew MS S aureus.  CT chest shows no PE but some vol loss and cirrhosis. Likely source was cellulitis in thigh. She did not have MRI done of back. Dced on IV ancef for planned 2-6 week course. She was readmitted soon after dc with confusion and tachycardia. Temp was elevated and fu BCX are again +.  Picc was placed 11/18.  Bcx done 11/16 just turned + 11/21 (today)  Recommendations Bacteremia- I suspect the picc was placed while she was still bacteremia. See above (Picc placed 11/18 but cx done 11/16 turned + afterwards) TEE neg WIll remove picc, place PIV and repeat bcx to document clearance Replace picc once bcx negative  Thank you ver much for allowing me to participate in the care of this patient. Please call with questions.   Cheral Marker. Ola Spurr, MD

## 2015-06-08 NOTE — Clinical Social Work Placement (Signed)
   CLINICAL SOCIAL WORK PLACEMENT  NOTE  Date:  06/08/2015  Patient Details  Name: Sharon Kirby MRN: [id number] Date of Birth: 11/09/1950  Clinical Social Work is seeking post-discharge placement for this patient at the [place] level of care (*CSW will initial, date and re-position this form in  chart as items are completed):  Yes   Patient/family provided with [facility]'s list of facilities offering this level of care within the geographic area requested by the patient (or if unable, by the patient's family).  Yes   Patient/family informed of their freedom to choose among providers that offer the needed level of care, that participate in Medicare, Medicaid or managed care program needed by the patient, have an available bed and are willing to accept the patient.  Yes   Patient/family informed of [facility]'s ownership interest in [facility] and [facility], as well as of the fact that they are under no obligation to receive care at these facilities.  PASRR submitted to EDS on       PASRR number received on       Existing PASRR number confirmed on 06/08/15     FL2 transmitted to all facilities in geographic area requested by pt/family on 06/01/15     FL2 transmitted to all facilities within larger geographic area on       Patient informed that his/her managed care company has contracts with or will negotiate with certain facilities, including the following:            Patient/family informed of bed offers received.  Patient chooses bed at       Physician recommends and patient chooses bed at      Patient to be transferred to   on  .  Patient to be transferred to facility by       Patient family notified on   of transfer.  Name of family member notified:        PHYSICIAN Please sign FL2     Additional Comment:    _______________________________________________ Loralyn Freshwater, LCSW 06/08/2015, 3:46  PM

## 2015-06-08 NOTE — Progress Notes (Signed)
Patient: Sharon Kirby / Admit Date: 06/06/2015 / Date of Encounter: 06/08/2015, 8:14 AM   Subjective: No chest pain or palpitations. Notes cough and back pain when supine. She is for TEE today. 2/2 blood cultures from 11/19 positive for GPC in clusters. Repeat blood cultures pending preliminary report at this time. WBC trending up at this time from 16.5-->17.5.     Review of Systems: Review of Systems  Constitutional: Positive for fever, chills and malaise/fatigue. Negative for weight loss and diaphoresis.  HENT: Negative for congestion.   Eyes: Negative for discharge and redness.  Respiratory: Positive for cough and shortness of breath. Negative for hemoptysis, sputum production and wheezing.   Cardiovascular: Negative for chest pain, palpitations, orthopnea, claudication, leg swelling and PND.  Gastrointestinal: Negative for heartburn, nausea, vomiting and abdominal pain.  Musculoskeletal: Negative for falls.  Skin: Negative for rash.  Neurological: Positive for weakness. Negative for sensory change, speech change and focal weakness.  Endo/Heme/Allergies: Does not bruise/bleed easily.  Psychiatric/Behavioral: The patient is not nervous/anxious.     Objective: Telemetry: not on tele Physical Exam: Blood pressure 142/60, pulse 106, temperature 98.9 F (37.2 C), temperature source Oral, resp. rate 18, height 5\' 1"  (1.549 m), weight 309 lb 9.6 oz (140.434 kg), SpO2 98 %. Body mass index is 58.53 kg/(m^2). General: Well developed, well nourished, in no acute distress. Head: Normocephalic, atraumatic, sclera non-icteric, no xanthomas, nares are without discharge. Neck: Negative for carotid bruits. JVP not elevated. Lungs: Clear bilaterally to auscultation without wheezes, rales, or rhonchi. Breathing is unlabored. Heart: RRR S1 S2. I/VI systolic murmur RUSB. No rubs or gallops.  Abdomen: Morbidly obese, soft, non-tender, non-distended with normoactive bowel sounds. No  rebound/guarding. Extremities: No clubbing or cyanosis. No edema. No splinter hemorrhages, Janeway lesions, or Osler nodes.  Neuro: Alert and oriented X 3. Moves all extremities spontaneously. Psych:  Responds to questions appropriately with a normal affect.   Intake/Output Summary (Last 24 hours) at 06/08/15 0814 Last data filed at 06/08/15 0600  Gross per 24 hour  Intake   1973 ml  Output    650 ml  Net   1323 ml    Inpatient Medications:  . aspirin EC  81 mg Oral Daily  . butamben-tetracaine-benzocaine      . cefTRIAXone (ROCEPHIN)  IV  1 g Intravenous Q24H  . clonazePAM  1 mg Oral BID  . fentaNYL      . ferrous sulfate  325 mg Oral BID WC  . heparin  5,000 Units Subcutaneous 3 times per day  . insulin aspart  0-5 Units Subcutaneous QHS  . insulin aspart  0-9 Units Subcutaneous TID WC  . lactulose  40 g Oral TID  . lidocaine      . lisinopril  10 mg Oral Daily  . loratadine  10 mg Oral Daily  . midazolam      . pantoprazole  40 mg Oral Daily  . PARoxetine  40 mg Oral Daily  . vancomycin  1,000 mg Intravenous Q12H  . zinc gluconate  50 mg Oral Daily   Infusions:  . sodium chloride 50 mL/hr at 06/07/15 2344    Labs:  Recent Labs  06/06/15 1104 06/07/15 0455  NA 134* 134*  K 4.5 3.6  CL 102 106  CO2 24 22  GLUCOSE 142* 119*  BUN 15 16  CREATININE 0.39* 0.40*  CALCIUM 9.5 8.7*    Recent Labs  06/06/15 1104  AST 159*  ALT 98*  ALKPHOS 466*  BILITOT 1.8*  PROT 6.4*  ALBUMIN 2.2*    Recent Labs  06/06/15 1104 06/07/15 0455 06/08/15 0504  WBC 16.2* 16.5* 17.5*  NEUTROABS 13.0*  --  14.8*  HGB 10.7* 10.0* 9.4*  HCT 32.8* 30.3* 28.4*  MCV 87.2 87.6 89.0  PLT 146* 139* 139*   No results for input(s): CKTOTAL, CKMB, TROPONINI in the last 72 hours. Invalid input(s): POCBNP No results for input(s): HGBA1C in the last 72 hours.   Weights: Filed Weights   06/06/15 1049 06/06/15 1147 06/06/15 1900  Weight: 308 lb (139.708 kg) 311 lb (141.069 kg)  309 lb 9.6 oz (140.434 kg)     Radiology/Studies:  Dg Chest 1 View  06/05/2015  CLINICAL DATA:  Right-sided PICC placement EXAM: CHEST 1 VIEW COMPARISON:  06/02/2015 FINDINGS: Right PICC tip projects in the lower superior vena cava, well positioned. Lung volumes remain low. There is mild basilar atelectasis. No convincing pneumonia or edema. No pleural effusion or pneumothorax. Cardiac silhouette is mildly enlarged. IMPRESSION: 1. Right PICC is well positioned with its tip in the lower superior vena cava. 2. No acute findings in the lungs. No other change from the prior study. Electronically Signed   By: Lajean Manes M.D.   On: 06/05/2015 10:49   Dg Chest 2 View  06/02/2015  CLINICAL DATA:  Back pain, chronic right leg pain EXAM: CHEST  2 VIEW COMPARISON:  None. FINDINGS: Cardiomegaly is noted. There is elevation of the right hemidiaphragm. No acute infiltrate or pleural effusion. No pulmonary edema. Mild degenerative changes mid and lower thoracic spine. IMPRESSION: Cardiomegaly. No active disease. Elevation of the right hemidiaphragm. Electronically Signed   By: Lahoma Crocker M.D.   On: 06/02/2015 14:58   Dg Lumbar Spine Complete  06/02/2015  CLINICAL DATA:  Back pain, chronic right leg pain EXAM: LUMBAR SPINE - COMPLETE 4+ VIEW COMPARISON:  None. FINDINGS: Four views of lumbar spine submitted. There is moderate disc space flattening at L5-S1 level. About 8 mm anterolisthesis L5 on S1 vertebral body. Facet degenerative changes L4 and L5 level. Disc space flattening with mild anterior spurring and endplate sclerotic changes at T12-L1 level. No acute fracture. Moderate gas throughout the colon. IMPRESSION: No acute fracture. There is about 8 mm anterolisthesis L5 on S1 vertebral body. Disc space flattening at T12-L1 and L5-S1 level. Electronically Signed   By: Lahoma Crocker M.D.   On: 06/02/2015 15:00   Ct Head Wo Contrast  06/06/2015  CLINICAL DATA:  Pt presents from [facility] with c/o AMS.  Per EMS report pt was d/c from hospital yesterday for sepsis and admitted to [facility]. Pt tachycardic upon arrival with incomprehensible speech. Daughter states pt history of brain tumor, seizures and stroke. EXAM: CT HEAD WITHOUT CONTRAST TECHNIQUE: Contiguous axial images were obtained from the base of the skull through the vertex without intravenous contrast. COMPARISON:  05/11/2015 FINDINGS: Bilateral ventriculostomy is extend from the parietal bones to the anterior aspects of both lateral ventricles, without change. Ventricles are enlarged, similar to the prior study, consistent with diffuse atrophy. There is no convincing hydrocephalus. There are no parenchymal masses or mass effect. There is no evidence of a cortical infarct. There are no extra-axial masses or abnormal fluid collections. There is no intracranial hemorrhage. The visualized sinuses and mastoid air cells are clear. IMPRESSION: 1. No acute intracranial abnormalities. 2. Bilateral ventriculostomy is are stable. The degree of ventricular enlargement is stable. Electronically Signed   By: Lajean Manes M.D.   On: 06/06/2015  12:36   Ct Head Wo Contrast  05/11/2015  CLINICAL DATA:  64 year old female with previous shunt placement and new daily persistent headaches. Initial encounter. EXAM: CT HEAD WITHOUT CONTRAST TECHNIQUE: Contiguous axial images were obtained from the base of the skull through the vertex without intravenous contrast. COMPARISON:  Noncontrast head CT 04/14/2012. FINDINGS: Bilateral posterior ventriculostomy catheters track through the lateral ventricles and appears stable in configuration since 2013. In the occipital scalp soft tissues in the midline these appear connected to a right cisterna magna catheter which courses into the dorsal left CSF space at the level of the cervicomedullary junction. The tip of this catheter is not included, but visualized portions appear stable since 2013. Lateral ventricle size and  configuration is stable. The temporal horns are decompressed as before. The third and fourth ventricles are decompressed as before. The cerebral aqueduct appears stable. No intracranial mass or mass effect identified. Dystrophic basal ganglia and right cerebellar nucleus calcifications greater on the left are stable. No acute intracranial hemorrhage identified. No cortically based acute infarct identified. Calcified atherosclerosis at the skull base. Gray-white matter differentiation is stable and within normal limits. Stable visualized osseous structures. Visualized paranasal sinuses and mastoids are clear. Stable visualized orbit and scalp soft tissues. IMPRESSION: Stable CT appearance of the brain since 2013. No acute intracranial abnormality. Electronically Signed   By: Genevie Ann M.D.   On: 05/11/2015 15:55   Ct Angio Chest Pe W/cm &/or Wo Cm  06/02/2015  CLINICAL DATA:  Shortness of breath with elevated troponin and back pain. EXAM: CT ANGIOGRAPHY CHEST WITH CONTRAST TECHNIQUE: Multidetector CT imaging of the chest was performed using the standard protocol during bolus administration of intravenous contrast. Multiplanar CT image reconstructions and MIPs were obtained to evaluate the vascular anatomy. CONTRAST:  72mL OMNIPAQUE IOHEXOL 350 MG/ML SOLN COMPARISON:  Chest radiograph 06/02/2015 FINDINGS: No evidence for a pulmonary embolism. No significant pericardial or pleural fluid. Subcarinal tissue measures up to 1.3 cm in the short axis. Otherwise, there is no significant chest lymphadenopathy. There is a small amount of perihepatic ascites and the liver has a nodular contour. In addition, the spleen is enlarged measuring 18.2 cm in the AP dimension. Gallbladder has been removed. Small lymph nodes versus varices in the gastrohepatic ligament region. Cannot exclude small esophageal varices. Limited evaluation of the portal venous system on this examination. The trachea and mainstem bronchi are patent. Volume  loss in the right lower lobe. There is also volume loss in the left lower lobe. No significant airspace disease or consolidation in the lungs. Degenerative changes at the glenohumeral joints are incompletely visualized. No acute bone abnormality. Review of the MIP images confirms the above findings. IMPRESSION: No evidence for a pulmonary embolism. Volume loss in the lower lobes, right side greater the left. Cirrhosis with portal hypertension. Portal hypertension demonstrated by perihepatic ascites, splenomegaly and possible varices. Electronically Signed   By: Markus Daft M.D.   On: 06/02/2015 17:22   Dg Chest Port 1 View  06/06/2015  CLINICAL DATA:  Confusion. EXAM: PORTABLE CHEST 1 VIEW COMPARISON:  06/05/2015 FINDINGS: Rotational artifact. Right arm PICC line is noted with tip in the SVC. Heart size is moderately enlarged. The lung volumes are low. Pulmonary vascular congestion noted. There is no pleural effusion or edema. IMPRESSION: 1. Cardiac enlargement and low lung volumes. 2. Pulmonary vascular congestion. Electronically Signed   By: Kerby Moors M.D.   On: 06/06/2015 12:22   Dg Knee Complete 4 Views Right  05/31/2015  CLINICAL DATA:  Fall onto right knee 3 days ago with severe pain with weight-bearing. Initial encounter. EXAM: RIGHT KNEE - COMPLETE 4+ VIEW COMPARISON:  None. FINDINGS: A total knee arthroplasty is well seated. No periprosthetic fracture or malalignment. No evidence of joint effusion. Osteopenic appearance of the bones. IMPRESSION: 1. No acute finding. 2. Unremarkable total knee arthroplasty. Electronically Signed   By: Monte Fantasia M.D.   On: 05/31/2015 04:21     Assessment and Plan   1. Recurrent MSSA sepsis: -She is for TEE today to evaluate for bacterial endocarditis   -TTE showed EF 60-65%, normal wall motion, GR1DD, calcified mitral annulus, normal RV systolic function, PASP 37 mm Hg -After careful review of history and examination, the risks and benefits of  transesophageal echocardiogram have been explained including risks of esophageal damage, perforation (1:10,000 risk), bleeding, pharyngeal hematoma as well as other potential complications associated with conscious sedation including aspiration, arrhythmia, respiratory failure and death. Alternatives to treatment were discussed, questions were answered. Patient is willing to proceed.    Melvern Banker, PA-C Pager: [phone] 06/08/2015, 8:14 AM

## 2015-06-08 NOTE — Progress Notes (Signed)
*  PRELIMINARY RESULTS* Echocardiogram Echocardiogram Transesophageal has been performed.  Sharon Kirby 06/08/2015, 9:11 AM

## 2015-06-08 NOTE — Progress Notes (Signed)
*  PRELIMINARY RESULTS* Echocardiogram Echocardiogram Transesophageal has been performed.  Sharon Kirby 06/08/2015, 9:12 AM

## 2015-06-08 NOTE — Progress Notes (Signed)
ANTIBIOTIC CONSULT NOTE - INITIAL  Pharmacy Consult for cefazolin dosing Indication: bactermia  Allergies  Allergen Reactions  . Sulfa Antibiotics Anaphylaxis  . Penicillins Rash and Other (See Comments)    Has patient had a PCN reaction causing immediate rash, facial/tongue/throat swelling, SOB or lightheadedness with hypotension: No Has patient had a PCN reaction causing severe rash involving mucus membranes or skin necrosis: No Has patient had a PCN reaction that required hospitalization No Has patient had a PCN reaction occurring within the last 10 years: Yes If all of the above answers are "NO", then may proceed with Cephalosporin use.    Patient Measurements: Height: 5\' 1"  (154.9 cm) Weight: (!) 309 lb 9.6 oz (140.434 kg) IBW/kg (Calculated) : 47.8  Vital Signs: Temp: 98.2 F (36.8 C) (11/21 1244) Temp Source: Oral (11/21 1244) BP: 156/87 mmHg (11/21 1244) Pulse Rate: 103 (11/21 1244)  Labs:  Recent Labs  06/06/15 1104 06/07/15 0455 06/08/15 0504  WBC 16.2* 16.5* 17.5*  HGB 10.7* 10.0* 9.4*  PLT 146* 139* 139*  CREATININE 0.39* 0.40*  --    Estimated Creatinine Clearance: 95.1 mL/min (by C-G formula based on Cr of 0.4). No results for input(s): VANCOTROUGH, VANCOPEAK, VANCORANDOM, GENTTROUGH, GENTPEAK, GENTRANDOM, TOBRATROUGH, TOBRAPEAK, TOBRARND, AMIKACINPEAK, AMIKACINTROU, AMIKACIN in the last 72 hours.   Microbiology: Recent Results (from the past 720 hour(s))  Urine culture     Status: None   Collection Time: 06/02/15  6:38 PM  Result Value Ref Range Status   Specimen Description URINE, RANDOM  Final   Special Requests NONE  Final   Culture 30,000 COLONIES/mL STAPHYLOCOCCUS AUREUS  Final   Report Status 06/05/2015 FINAL  Final   Organism ID, Bacteria STAPHYLOCOCCUS AUREUS  Final      Susceptibility   Staphylococcus aureus - MIC*    CIPROFLOXACIN >=8 RESISTANT Resistant     ERYTHROMYCIN 4 RESISTANT Resistant     GENTAMICIN <=0.5 SENSITIVE Sensitive      OXACILLIN 1 SENSITIVE Sensitive     TRIMETH/SULFA <=10 SENSITIVE Sensitive     CLINDAMYCIN <=0.25 RESISTANT Resistant     CEFOXITIN SCREEN NEGATIVE Sensitive     Inducible Clindamycin Value in next row Resistant      POSITIVEINDUCIBLE CLINDAMYCIN RESISTANCE - A positive ICR test is indicative of inducible resistance to macrolides, lincosamides, and type B streptogramin.  This isolate is presumed to be resistant to Clindamycin, however, Clindamycin may still be effective in some patients.     TETRACYCLINE Value in next row Sensitive      SENSITIVE<=1    * 30,000 COLONIES/mL STAPHYLOCOCCUS AUREUS  Blood culture (routine x 2)     Status: None   Collection Time: 06/02/15  6:39 PM  Result Value Ref Range Status   Specimen Description BLOOD RIGHT ARM  Final   Special Requests BOTTLES DRAWN AEROBIC AND ANAEROBIC 4CC  Final   Culture  Setup Time   Final    GRAM POSITIVE COCCI IN CLUSTERS IN BOTH AEROBIC AND ANAEROBIC BOTTLES CRITICAL RESULT CALLED TO, READ BACK BY AND VERIFIED WITH: DONEESHA ROBERTSON [id number] ON 06/03/15 CTJ    Culture   Final    STAPHYLOCOCCUS AUREUS IN BOTH AEROBIC AND ANAEROBIC BOTTLES    Report Status 06/05/2015 FINAL  Final   Organism ID, Bacteria STAPHYLOCOCCUS AUREUS  Final      Susceptibility   Staphylococcus aureus - MIC*    CIPROFLOXACIN >=8 RESISTANT Resistant     ERYTHROMYCIN 4 RESISTANT Resistant     GENTAMICIN <=0.5 SENSITIVE Sensitive  OXACILLIN <=0.25 SENSITIVE Sensitive     TRIMETH/SULFA <=10 SENSITIVE Sensitive     CLINDAMYCIN <=0.25 RESISTANT Resistant     CEFOXITIN SCREEN NEGATIVE Sensitive     Inducible Clindamycin Value in next row Resistant      POSITIVEINDUCIBLE CLINDAMYCIN RESISTANCE - A positive ICR test is indicative of inducible resistance to macrolides, lincosamides, and type B streptogramin.  This isolate is presumed to be resistant to Clindamycin, however, Clindamycin may still be effective in some patients.     TETRACYCLINE Value in  next row Sensitive      SENSITIVE<=1    * STAPHYLOCOCCUS AUREUS  Blood culture (routine x 2)     Status: None   Collection Time: 06/02/15  6:39 PM  Result Value Ref Range Status   Specimen Description BLOOD RIGHT ASSIST CONTROL  Final   Special Requests BOTTLES DRAWN AEROBIC AND ANAEROBIC 4CC  Final   Culture  Setup Time   Final    GRAM POSITIVE COCCI IN CLUSTERS IN BOTH AEROBIC AND ANAEROBIC BOTTLES CRITICAL VALUE NOTED.  VALUE IS CONSISTENT WITH PREVIOUSLY REPORTED AND CALLED VALUE.    Culture   Final    STAPHYLOCOCCUS AUREUS IN BOTH AEROBIC AND ANAEROBIC BOTTLES    Report Status 06/05/2015 FINAL  Final   Organism ID, Bacteria STAPHYLOCOCCUS AUREUS  Final      Susceptibility   Staphylococcus aureus - MIC*    CIPROFLOXACIN >=8 RESISTANT Resistant     ERYTHROMYCIN 4 RESISTANT Resistant     GENTAMICIN <=0.5 SENSITIVE Sensitive     OXACILLIN <=0.25 SENSITIVE Sensitive     TRIMETH/SULFA <=10 SENSITIVE Sensitive     CLINDAMYCIN <=0.25 RESISTANT Resistant     CEFOXITIN SCREEN NEGATIVE Sensitive     Inducible Clindamycin Value in next row Resistant      POSITIVEINDUCIBLE CLINDAMYCIN RESISTANCE - A positive ICR test is indicative of inducible resistance to macrolides, lincosamides, and type B streptogramin.  This isolate is presumed to be resistant to Clindamycin, however, Clindamycin may still be effective in some patients.     TETRACYCLINE Value in next row Sensitive      SENSITIVE<=1    * STAPHYLOCOCCUS AUREUS  Culture, blood (routine x 2)     Status: None (Preliminary result)   Collection Time: 06/03/15  3:34 PM  Result Value Ref Range Status   Specimen Description BLOOD LEFT ARM  Final   Special Requests BOTTLES DRAWN AEROBIC AND ANAEROBIC 4CC  Final   Culture  Setup Time   Final    GRAM POSITIVE COCCI ANAEROBIC BOTTLE ONLY CRITICAL RESULT CALLED TO, READ BACK BY AND VERIFIED WITH: RN ANABELLA ROBINSON 06/08/15 0800    Culture   Final    GRAM POSITIVE COCCI ANAEROBIC BOTTLE  ONLY IDENTIFICATION TO FOLLOW    Report Status PENDING  Incomplete  Culture, blood (routine x 2)     Status: None (Preliminary result)   Collection Time: 06/03/15  3:46 PM  Result Value Ref Range Status   Specimen Description BLOOD LEFT ARM  Final   Special Requests BOTTLES DRAWN AEROBIC AND ANAEROBIC 4CC  Final   Culture NO GROWTH 4 DAYS  Final   Report Status PENDING  Incomplete  Culture, blood (routine x 2)     Status: None (Preliminary result)   Collection Time: 06/06/15 11:05 AM  Result Value Ref Range Status   Specimen Description BLOOD RIGHT PICC  Final   Special Requests   Final    BOTTLES DRAWN AEROBIC AND ANAEROBIC ANAEROBIC 3ML AEROBIC 6ML  Culture  Setup Time   Final    GRAM POSITIVE COCCI IN CLUSTERS IN BOTH AEROBIC AND ANAEROBIC BOTTLES CRITICAL RESULT CALLED TO, READ BACK BY AND VERIFIED WITH: EMILY HARRON AT [id number] ON 06/07/15 CTJ    Culture   Final    STAPHYLOCOCCUS AUREUS IN BOTH AEROBIC AND ANAEROBIC BOTTLES SUSCEPTIBILITIES TO FOLLOW    Report Status PENDING  Incomplete  Urine culture     Status: None   Collection Time: 06/06/15 11:05 AM  Result Value Ref Range Status   Specimen Description URINE, RANDOM  Final   Special Requests NONE  Final   Culture NO GROWTH 2 DAYS  Final   Report Status 06/08/2015 FINAL  Final  Culture, blood (routine x 2)     Status: None (Preliminary result)   Collection Time: 06/06/15 11:48 AM  Result Value Ref Range Status   Specimen Description BLOOD LEFT AC  Final   Special Requests   Final    BOTTLES DRAWN AEROBIC AND ANAEROBIC  AER 3CC ANA 1CC   Culture  Setup Time   Final    GRAM POSITIVE COCCI IN CLUSTERS IN BOTH AEROBIC AND ANAEROBIC BOTTLES CRITICAL RESULT CALLED TO, READ BACK BY AND VERIFIED WITH: EMILY HARRON AT 0915 ON 06/07/15 CTJ    Culture   Final    STAPHYLOCOCCUS AUREUS IN BOTH AEROBIC AND ANAEROBIC BOTTLES SUSCEPTIBILITIES TO FOLLOW    Report Status PENDING  Incomplete  C difficile quick scan w PCR reflex      Status: None   Collection Time: 06/06/15 11:48 AM  Result Value Ref Range Status   C Diff antigen NEGATIVE NEGATIVE Final   C Diff toxin NEGATIVE NEGATIVE Final   C Diff interpretation Negative for C. difficile  Final   Assessment: Pharmacy consulted to dose cefazolin for MSSA bacteremia in this 64 year old female recently discharged on cefazolin for the same. Now readmitted with staph bacteremia (sens pending). Started vancomycin on admission x 3 days. Changing back to cefazolin per ID - suspect previous PICC line placed prior to cultures clearing.   Plan:  Ordered cefazolin 2gm IV Q8H based on indication and renal function.  Pharmacy to follow per consult  Rexene Edison, PharmD Clinical Pharmacist 06/08/2015 3:00 PM

## 2015-06-08 NOTE — Progress Notes (Signed)
Patient was transported to TEE without  primary nurse's approval.

## 2015-06-08 NOTE — Clinical Social Work Note (Signed)
Clinical Social Work Assessment  Patient Details  Name: Sharon Kirby MRN: [id number] Date of Birth: [birth year]  Date of referral:  06/08/15               Reason for consult:  Facility Placement, Other (Comment Required) (From [facility])                Permission sought to share information with:  Chartered certified accountant granted to share information::  Yes, Verbal Permission Granted  Name::      Sharon::   Kirby   Relationship::     Contact Information:     Housing/Transportation Living arrangements for the past 2 months:  [place], [place] of Information:  Patient, Adult Children Patient Interpreter Needed:  None Criminal Activity/Legal Involvement Pertinent to Current Situation/Hospitalization:  No - Comment as needed Significant Relationships:  Adult Children Lives with:  Self, Facility Resident Do you feel safe going back to the place where you live?  Yes Need for family participation in patient care:  Yes (Comment)  Care giving concerns:  Patient was at [facility] for short term rehab.   Social Worker assessment / plan:  Holiday representative (CSW) received consult that patient is from [facility]. CSW attempted to meet with patient however she appeared confused. Patient was oriented to self. Patient did report that she lived with her daughter and gave CSW permission to call her children. Patient reported that she would be agreeable to return to rehab. CSW contacted patient's daughter Sharon Kirby [phone]. Per daughter she and her brother Sharon Kirby [phone] have dual POA. Per Sharon Kirby patient was at [facility] for less then 24 hours before being sent back to [facility]. Sharon Kirby reported that she does not want patient to return to [facility] and prefers [facility]. Per daughter patient's brother Sharon Kirby made the decision to move her to [facility] last time and was not familiar with the facilities. Per Sharon Kirby patient was at [facility] in 2006 when she had a knee replacement. CSW explained to daughter that [facility] will have to approve SNF stay. CSW asked MD to order PT consult. CSW also contacted Sharon Kirby and made her aware of above.   FL2 complete and faxed out. CSW will follow up with bed offers when available. Per RN patient will not be ready for D/C for a few days. CSW will continue to follow and assist as needed.      Employment status:  Disabled (Comment on whether or not currently receiving Disability), Retired Nurse, adult PT Recommendations:  Not assessed at this time (CSW asked MD to order PT consult. ) Information / Referral to community resources:  [place]  Patient/Family's Response to care:  Patient's daughter is agreeable to SNF search and prefers [facility]   Patient/Family's Understanding of and Emotional Response to Diagnosis, Current Treatment, and Prognosis: Patient and daughter were pleasant.   Emotional Assessment Appearance:  Appears stated age Attitude/Demeanor/Rapport:    Affect (typically observed):  Pleasant Orientation:  Oriented to Self, Oriented to Place, Fluctuating Orientation (Suspected and/or reported Sundowners) Alcohol / Substance use:  Not Applicable Psych involvement (Current and /or in the community):  No (Comment)  Discharge Needs  Concerns to be addressed:  Discharge Planning Concerns Readmission within the last 30 days:  No Current discharge risk:  Dependent with Mobility Barriers to Discharge:  Continued Medical Work up  Sharon Freshwater, LCSW 06/08/2015, 3:49 PM

## 2015-06-08 NOTE — Care Management Important Message (Signed)
Important Message  Patient Details  Name: Sharon Kirby MRN: [id number] Date of Birth: [birth year]   Medicare Important Message Given:  Yes    Sharon Kirby 06/08/2015, 10:23 AM

## 2015-06-08 NOTE — NC FL2 (Signed)
[place] LEVEL OF CARE SCREENING TOOL     IDENTIFICATION  Patient Name: Sharon Kirby Birthdate: Aug 22, 1950 Sex: female Admission Date (Current Location): 06/06/2015  [place] and [place] Number:  [facility] )   Facility and Address:  [facility], [address], [place], [place] [address]      Provider Number: [id number]  Attending Physician Name and Address:  Hillary Bow, MD  Relative Name and Phone Number:       Current Level of Care: Hospital Recommended Level of Care: [place] Prior Approval Number:    Date Approved/Denied:   PASRR Number:  ( [id number] )  Discharge Plan: SNF    Current Diagnoses: Patient Active Problem List   Diagnosis Date Noted  . Bacterial endocarditis   . Sepsis ([place]) 06/06/2015  . Acute delirium 06/05/2015  . Hyponatremia 06/02/2015  . Acute blood loss anemia 04/13/2015  . Iron deficiency anemia   . Angiodysplasia of intestinal tract   . GI bleed 04/09/2015    Orientation ACTIVITIES/SOCIAL BLADDER RESPIRATION    Self, Time, Situation, Place  Active Incontinent, Indwelling catheter Normal  BEHAVIORAL SYMPTOMS/MOOD NEUROLOGICAL BOWEL NUTRITION STATUS   (none )  (none ) Incontinent (Rectal Tube ) Diet (Diet: Heart Healthy/ Card Modified )  PHYSICIAN VISITS COMMUNICATION OF NEEDS Height & Weight Skin  30 days Verbally 5\' 1"  (154.9 cm) 309 lbs. Normal          AMBULATORY STATUS RESPIRATION    Assist extensive Normal      Personal Care Assistance Level of Assistance  Bathing, Feeding, Dressing Bathing Assistance: Limited assistance Feeding assistance: Independent Dressing Assistance: Limited assistance      Functional Limitations Info  Sight, Hearing, Speech Sight Info: Adequate Hearing Info: Adequate Speech Info: Adequate       SPECIAL CARE FACTORS FREQUENCY  PT (By licensed PT), OT (By licensed OT)     PT Frequency:  (5) OT Frequency:  (5)            Additional Factors Info  Code Status, Allergies, Insulin Sliding Scale Code Status Info:  (Full Code. ) Allergies Info:  (Sulfa Antibiotics, Penicillins)   Insulin Sliding Scale Info:  (Novolog Insulin Injections 3 times daily. )       Current Medications (06/08/2015): Current Facility-Administered Medications  Medication Dose Route Frequency Provider Last Rate Last Dose  . 0.9 %  sodium chloride infusion   Intravenous Continuous Hillary Bow, MD 50 mL/hr at 06/07/15 2344    . acetaminophen (TYLENOL) tablet 650 mg  650 mg Oral Q6H PRN Demetrios Loll, MD       Or  . acetaminophen (TYLENOL) suppository 650 mg  650 mg Rectal Q6H PRN Demetrios Loll, MD      . albuterol (PROVENTIL) (2.5 MG/3ML) 0.083% nebulizer solution 2.5 mg  2.5 mg Nebulization Q2H PRN Demetrios Loll, MD      . aspirin EC tablet 81 mg  81 mg Oral Daily Demetrios Loll, MD   81 mg at 06/08/15 1140  . butamben-tetracaine-benzocaine (CETACAINE) 08-19-12 % spray           . ceFAZolin (ANCEF) IVPB 2 g/50 mL premix  2 g Intravenous 3 times per day Adrian Prows, MD      . clonazePAM Bobbye [place]) tablet 1 mg  1 mg Oral BID Demetrios Loll, MD   1 mg at 06/07/15 2127  . dimenhyDRINATE (DRAMAMINE) tablet 50 mg  50 mg Oral Q8H PRN Demetrios Loll, MD      .  docusate sodium (COLACE) capsule 100 mg  100 mg Oral Q12H PRN Demetrios Loll, MD      . fentaNYL (SUBLIMAZE) 100 MCG/2ML injection           . ferrous sulfate tablet 325 mg  325 mg Oral BID WC Demetrios Loll, MD   325 mg at 06/08/15 1139  . heparin injection 5,000 Units  5,000 Units Subcutaneous 3 times per day Demetrios Loll, MD   5,000 Units at 06/08/15 1431  . HYDROcodone-acetaminophen (NORCO/VICODIN) 5-325 MG per tablet 1 tablet  1 tablet Oral Q6H PRN Demetrios Loll, MD   1 tablet at 06/08/15 0357  . hydrOXYzine (ATARAX/VISTARIL) tablet 25 mg  25 mg Oral Q8H PRN Demetrios Loll, MD      . insulin aspart (novoLOG) injection 0-5 Units  0-5 Units Subcutaneous QHS Demetrios Loll, MD   0 Units at 06/06/15 2200  . insulin aspart  (novoLOG) injection 0-9 Units  0-9 Units Subcutaneous TID WC Demetrios Loll, MD   2 Units at 06/07/15 1712  . lactulose (CHRONULAC) 10 GM/15ML solution 40 g  40 g Oral TID Demetrios Loll, MD   40 g at 06/08/15 1141  . lisinopril (PRINIVIL,ZESTRIL) tablet 10 mg  10 mg Oral Daily Demetrios Loll, MD   10 mg at 06/08/15 1141  . loratadine (CLARITIN) tablet 10 mg  10 mg Oral Daily Demetrios Loll, MD   10 mg at 06/08/15 1140  . methocarbamol (ROBAXIN) tablet 500 mg  500 mg Oral Q6H PRN Demetrios Loll, MD      . midazolam (VERSED) 5 MG/5ML injection           . ondansetron (ZOFRAN) tablet 4 mg  4 mg Oral Q6H PRN Demetrios Loll, MD       Or  . ondansetron [facility]) injection 4 mg  4 mg Intravenous Q6H PRN Demetrios Loll, MD      . oxyCODONE-acetaminophen (PERCOCET/ROXICET) 5-325 MG per tablet 1 tablet  1 tablet Oral Q6H PRN Demetrios Loll, MD   1 tablet at 06/08/15 1139  . pantoprazole (PROTONIX) EC tablet 40 mg  40 mg Oral Daily Demetrios Loll, MD   40 mg at 06/08/15 1140  . PARoxetine (PAXIL) tablet 40 mg  40 mg Oral Daily Demetrios Loll, MD   40 mg at 06/08/15 1140  . zinc gluconate tablet 50 mg  50 mg Oral Daily Demetrios Loll, MD   50 mg at 06/08/15 1141   Do not use this list as official medication orders. Please verify with discharge summary.  Discharge Medications:   Medication List    ASK your doctor about these medications        aspirin EC 81 MG tablet  Take 81 mg by mouth daily.     CALCIUM 600+D 600-800 MG-UNIT Tabs  Generic drug:  Calcium Carb-Cholecalciferol  Take 1 tablet by mouth daily.     ceFAZolin 2 g in dextrose 5 % 50 mL ivpb  Inject 2 g into the vein every 8 (eight) hours.     cetirizine 10 MG tablet  Commonly known as:  ZYRTEC  Take 10 mg by mouth daily.     clonazePAM 1 MG tablet  Commonly known as:  KLONOPIN  Take 1 mg by mouth 2 (two) times daily.     dimenhyDRINATE 50 MG tablet  Commonly known as:  DRAMAMINE  Take 50 mg by mouth every 8 (eight) hours as needed for nausea.     docusate sodium 100 MG capsule   Commonly known  as:  COLACE  Take 100 mg by mouth every 12 (twelve) hours as needed for mild constipation or moderate constipation.     ferrous sulfate 325 (65 FE) MG tablet  Take 1 tablet (325 mg total) by mouth 2 (two) times daily with a meal.     glipiZIDE 10 MG 24 hr tablet  Commonly known as:  GLUCOTROL XL  Take 10 mg by mouth daily.     HYDROcodone-acetaminophen 5-325 MG tablet  Commonly known as:  NORCO/VICODIN  Take 1 tablet by mouth every 6 (six) hours as needed for moderate pain.     hydrOXYzine 25 MG tablet  Commonly known as:  ATARAX/VISTARIL  Take 25 mg by mouth every 8 (eight) hours as needed for itching.     ibuprofen 800 MG tablet  Commonly known as:  ADVIL,MOTRIN  Take 800 mg by mouth every 8 (eight) hours as needed for mild pain.     lactulose 10 GM/15ML solution  Commonly known as:  CHRONULAC  Take 40 g by mouth 3 (three) times daily.     lisinopril 10 MG tablet  Commonly known as:  PRINIVIL,ZESTRIL  Take 10 mg by mouth daily.     metFORMIN 1000 MG tablet  Commonly known as:  GLUCOPHAGE  Take 1,000 mg by mouth 2 (two) times daily with a meal.     methocarbamol 500 MG tablet  Commonly known as:  ROBAXIN  Take 500 mg by mouth every 6 (six) hours as needed for muscle spasms.     multivitamin with minerals Tabs tablet  Take 1 tablet by mouth daily.     omeprazole 20 MG capsule  Commonly known as:  PRILOSEC  Take 20 mg by mouth daily.     oxyCODONE-acetaminophen 5-325 MG tablet  Commonly known as:  ROXICET  Take 1 tablet by mouth every 6 (six) hours as needed for severe pain.     PARoxetine 40 MG tablet  Commonly known as:  PAXIL  Take 40 mg by mouth daily.     Potassium Gluconate 550 MG Tabs  Take 550 mg by mouth daily.     Turmeric Curcumin 500 MG Caps  Take 500 mg by mouth daily.     Vitamin D 2000 UNITS Caps  Take 2,000 Units by mouth daily.     zinc gluconate 50 MG tablet  Take 50 mg by mouth daily.        Relevant Imaging  Results:  Relevant Lab Results:  Recent Labs    Additional Information  (SSN: [id number])  Loralyn Freshwater, LCSW

## 2015-06-08 NOTE — Plan of Care (Signed)
Problem: Education: Goal: Knowledge of [place] [facility] Education information/materials will improve Outcome: Not Progressing Patient is confused  Problem: Safety: Goal: Ability to remain free from injury will improve Outcome: Progressing confused  Problem: Physical Regulation: Goal: Will remain free from infection Outcome: Not Progressing Bacteremia

## 2015-06-09 DIAGNOSIS — L8990 Pressure ulcer of unspecified site, unspecified stage: Secondary | ICD-10-CM | POA: Insufficient documentation

## 2015-06-09 LAB — CULTURE, BLOOD (ROUTINE X 2)

## 2015-06-09 LAB — COMPREHENSIVE METABOLIC PANEL
ALT: 57 U/L — ABNORMAL HIGH (ref 14–54)
AST: 79 U/L — AB (ref 15–41)
Albumin: 2 g/dL — ABNORMAL LOW (ref 3.5–5.0)
Alkaline Phosphatase: 378 U/L — ABNORMAL HIGH (ref 38–126)
Anion gap: 7 (ref 5–15)
BUN: 32 mg/dL — AB (ref 6–20)
CHLORIDE: 109 mmol/L (ref 101–111)
CO2: 18 mmol/L — AB (ref 22–32)
Calcium: 8.4 mg/dL — ABNORMAL LOW (ref 8.9–10.3)
Creatinine, Ser: 1.14 mg/dL — ABNORMAL HIGH (ref 0.44–1.00)
GFR calc Af Amer: 58 mL/min — ABNORMAL LOW (ref >60)
GFR, EST NON AFRICAN AMERICAN: 50 mL/min — AB (ref >60)
Glucose, Bld: 144 mg/dL — ABNORMAL HIGH (ref 65–99)
POTASSIUM: 4.2 mmol/L (ref 3.5–5.1)
SODIUM: 134 mmol/L — AB (ref 135–145)
Total Bilirubin: 1.1 mg/dL (ref 0.3–1.2)
Total Protein: 5.9 g/dL — ABNORMAL LOW (ref 6.5–8.1)

## 2015-06-09 LAB — CBC WITH DIFFERENTIAL/PLATELET
Basophils Absolute: 0 10*3/uL (ref 0–0.1)
Basophils Relative: 0 %
Eosinophils Absolute: 0.2 10*3/uL (ref 0–0.7)
Eosinophils Relative: 1 %
HCT: 26.9 % — ABNORMAL LOW (ref 35.0–47.0)
HEMOGLOBIN: 8.8 g/dL — AB (ref 12.0–16.0)
LYMPHS ABS: 1.1 10*3/uL (ref 1.0–3.6)
MCH: 29.3 pg (ref 26.0–34.0)
MCHC: 32.8 g/dL (ref 32.0–36.0)
MCV: 89.3 fL (ref 80.0–100.0)
Monocytes Absolute: 0.7 10*3/uL (ref 0.2–0.9)
Monocytes Relative: 5 %
Neutro Abs: 13.6 10*3/uL — ABNORMAL HIGH (ref 1.4–6.5)
Platelets: 130 10*3/uL — ABNORMAL LOW (ref 150–440)
RBC: 3.01 MIL/uL — AB (ref 3.80–5.20)
RDW: 24.6 % — ABNORMAL HIGH (ref 11.5–14.5)
WBC: 15.6 10*3/uL — AB (ref 3.6–11.0)

## 2015-06-09 LAB — GLUCOSE, CAPILLARY
GLUCOSE-CAPILLARY: 143 mg/dL — AB (ref 65–99)
GLUCOSE-CAPILLARY: 133 mg/dL — AB (ref 65–99)
GLUCOSE-CAPILLARY: 126 mg/dL — AB (ref 65–99)
Glucose-Capillary: 147 mg/dL — ABNORMAL HIGH (ref 65–99)

## 2015-06-09 LAB — PATHOLOGIST SMEAR REVIEW

## 2015-06-09 MED ORDER — METOPROLOL TARTRATE 25 MG PO TABS
25.0000 mg | ORAL_TABLET | Freq: Two times a day (BID) | ORAL | Status: DC
  Administered 2015-06-09 – 2015-06-15 (×12): 25 mg via ORAL
  Filled 2015-06-09 (×12): qty 1

## 2015-06-09 NOTE — Progress Notes (Signed)
[place] INFECTIOUS DISEASE PROGRESS NOTE Date of Admission:  06/06/2015     ID: Sharon Kirby is a 64 y.o. female with MSSA bacteremia  Principal Problem:   Sepsis ([place]) Active Problems:   Bacterial endocarditis   Pressure ulcer   Subjective: Picc line removed, no fevers, CT back done and reveiwed. WBC 15.   ROS  Eleven systems are reviewed and negative except per hpi  Medications:  Antibiotics Given (last 72 hours)    Date/Time Action Medication Dose Rate   06/06/15 1750 Given   ceFAZolin (ANCEF) IVPB 2 g/50 mL premix 2 g 100 mL/hr   06/06/15 2129 Given   ceFAZolin (ANCEF) IVPB 2 g/50 mL premix 2 g 100 mL/hr   06/07/15 0000 Given   vancomycin (VANCOCIN) IVPB 1000 mg/200 mL premix 1,000 mg 200 mL/hr   06/07/15 0513 Given   ceFAZolin (ANCEF) IVPB 2 g/50 mL premix 2 g 100 mL/hr   06/07/15 0844 Given   cefTRIAXone (ROCEPHIN) 1 g in dextrose 5 % 50 mL IVPB 1 g 100 mL/hr   06/07/15 1208 Given   vancomycin (VANCOCIN) IVPB 1000 mg/200 mL premix 1,000 mg 200 mL/hr   06/07/15 2344 Given   vancomycin (VANCOCIN) IVPB 1000 mg/200 mL premix 1,000 mg 200 mL/hr   06/08/15 1143 Given   vancomycin (VANCOCIN) IVPB 1000 mg/200 mL premix 1,000 mg 200 mL/hr   06/08/15 1550 Given   ceFAZolin (ANCEF) IVPB 2 g/50 mL premix 2 g 100 mL/hr   06/08/15 2232 Given   ceFAZolin (ANCEF) IVPB 2 g/50 mL premix 2 g 100 mL/hr   06/09/15 0514 Given   ceFAZolin (ANCEF) IVPB 2 g/50 mL premix 2 g 100 mL/hr   06/09/15 1350 Given   ceFAZolin (ANCEF) IVPB 2 g/50 mL premix 2 g 100 mL/hr     . aspirin EC  81 mg Oral Daily  .  ceFAZolin (ANCEF) IV  2 g Intravenous 3 times per day  . clonazePAM  1 mg Oral BID  . ferrous sulfate  325 mg Oral BID WC  . heparin  5,000 Units Subcutaneous 3 times per day  . insulin aspart  0-5 Units Subcutaneous QHS  . insulin aspart  0-9 Units Subcutaneous TID WC  . lactulose  40 g Oral TID  . lisinopril  10 mg Oral Daily  . loratadine  10 mg Oral Daily  . metoprolol  tartrate  25 mg Oral BID  . pantoprazole  40 mg Oral Daily  . PARoxetine  40 mg Oral Daily  . zinc gluconate  50 mg Oral Daily    Objective: Vital signs in last 24 hours: Temp:  [98 F (36.7 C)-98.2 F (36.8 C)] 98 F (36.7 C) (11/22 1211) Pulse Rate:  [78-103] 78 (11/22 1211) Resp:  [18] 18 (11/22 1211) BP: (109-170)/(52-81) 109/52 mmHg (11/22 1211) SpO2:  [97 %-100 %] 97 % (11/22 1211) Constitutional: morbidly obese, lying in bed, awake, able to converse, knows at [facility].  HENT: Oriental/AT, PERRLA, no scleral icterus Mouth/Throat: Oropharynx is clear and dry . No oropharyngeal exudate.  Cardiovascular: Normal rate, regular rhythm and normal heart sounds.  Pulmonary/Chest: Effort normal and breath sounds normal. No respiratory distress. has no wheezes.  Neck supple, no nuchal rigidity Abdominal: obese Soft. Bowel sounds are normal. exhibits no distension. There is no tenderness.  Lymphadenopathy: no cervical adenopathy. No axillary adenopathy Neurological: alert and interactive Skin: brusiing on arms, legs Mild skin breakdown in groin and upper thighs - R upper post thigh with 1x  2 cm shallow ulcer with clean base Psychiatric: more awake than priro   Lab Results  Recent Labs  06/07/15 0455 06/08/15 0504 06/09/15 0422 06/09/15 0719  WBC 16.5* 17.5*  --  15.6*  HGB 10.0* 9.4*  --  8.8*  HCT 30.3* 28.4*  --  26.9*  NA 134*  --  134*  --   K 3.6  --  4.2  --   CL 106  --  109  --   CO2 22  --  18*  --   BUN 16  --  32*  --   CREATININE 0.40*  --  1.14*  --     Microbiology:  Results for orders placed or performed during the hospital encounter of 06/06/15  Culture, blood (routine x 2)     Status: None   Collection Time: 06/06/15 11:05 AM  Result Value Ref Range Status   Specimen Description BLOOD RIGHT PICC  Final   Special Requests   Final    BOTTLES DRAWN AEROBIC AND ANAEROBIC ANAEROBIC 3ML AEROBIC 6ML   Culture  Setup Time   Final    GRAM POSITIVE COCCI IN  CLUSTERS IN BOTH AEROBIC AND ANAEROBIC BOTTLES CRITICAL RESULT CALLED TO, READ BACK BY AND VERIFIED WITH: EMILY HARRON AT [id number] ON 06/07/15 CTJ    Culture   Final    STAPHYLOCOCCUS AUREUS IN BOTH AEROBIC AND ANAEROBIC BOTTLES    Report Status 06/09/2015 FINAL  Final   Organism ID, Bacteria STAPHYLOCOCCUS AUREUS  Final      Susceptibility   Staphylococcus aureus - MIC*    CIPROFLOXACIN >=8 RESISTANT Resistant     ERYTHROMYCIN 4 RESISTANT Resistant     GENTAMICIN <=0.5 SENSITIVE Sensitive     OXACILLIN <=0.25 SENSITIVE Sensitive     TRIMETH/SULFA <=10 SENSITIVE Sensitive     CLINDAMYCIN <=0.25 RESISTANT Resistant     CEFOXITIN SCREEN NEGATIVE Sensitive     Inducible Clindamycin Value in next row Resistant      POSITIVEINDUCIBLE CLINDAMYCIN RESISTANCE - A positive ICR test is indicative of inducible resistance to macrolides, lincosamides, and type B streptogramin.  This isolate is presumed to be resistant to Clindamycin, however, Clindamycin may still be effective in some patients.     TETRACYCLINE Value in next row Sensitive      SENSITIVE<=1    * STAPHYLOCOCCUS AUREUS  Urine culture     Status: None   Collection Time: 06/06/15 11:05 AM  Result Value Ref Range Status   Specimen Description URINE, RANDOM  Final   Special Requests NONE  Final   Culture NO GROWTH 2 DAYS  Final   Report Status 06/08/2015 FINAL  Final  Culture, blood (routine x 2)     Status: None   Collection Time: 06/06/15 11:48 AM  Result Value Ref Range Status   Specimen Description BLOOD LEFT AC  Final   Special Requests   Final    BOTTLES DRAWN AEROBIC AND ANAEROBIC  AER 3CC ANA 1CC   Culture  Setup Time   Final    GRAM POSITIVE COCCI IN CLUSTERS IN BOTH AEROBIC AND ANAEROBIC BOTTLES CRITICAL RESULT CALLED TO, READ BACK BY AND VERIFIED WITH: EMILY HARRON AT [id number] ON 06/07/15 CTJ    Culture   Final    STAPHYLOCOCCUS AUREUS IN BOTH AEROBIC AND ANAEROBIC BOTTLES    Report Status 06/09/2015 FINAL  Final    Organism ID, Bacteria STAPHYLOCOCCUS AUREUS  Final      Susceptibility   Staphylococcus aureus - MIC*  CIPROFLOXACIN >=8 RESISTANT Resistant     ERYTHROMYCIN 4 RESISTANT Resistant     GENTAMICIN <=0.5 SENSITIVE Sensitive     OXACILLIN <=0.25 SENSITIVE Sensitive     TRIMETH/SULFA <=10 SENSITIVE Sensitive     CLINDAMYCIN <=0.25 RESISTANT Resistant     CEFOXITIN SCREEN NEGATIVE Sensitive     Inducible Clindamycin Value in next row Resistant      POSITIVEINDUCIBLE CLINDAMYCIN RESISTANCE - A positive ICR test is indicative of inducible resistance to macrolides, lincosamides, and type B streptogramin.  This isolate is presumed to be resistant to Clindamycin, however, Clindamycin may still be effective in some patients.     TETRACYCLINE Value in next row Sensitive      SENSITIVE<=1    * STAPHYLOCOCCUS AUREUS  C difficile quick scan w PCR reflex     Status: None   Collection Time: 06/06/15 11:48 AM  Result Value Ref Range Status   C Diff antigen NEGATIVE NEGATIVE Final   C Diff toxin NEGATIVE NEGATIVE Final   C Diff interpretation Negative for C. difficile  Final    Studies/Results: Ct Lumbar Spine W Contrast  06/08/2015  CLINICAL DATA:  Severe low back pain.  Recent MSSA infection. EXAM: CT LUMBAR SPINE WITH CONTRAST TECHNIQUE: Multidetector CT imaging of the lumbar spine was performed with intravenous contrast administration. Multiplanar CT image reconstructions were also generated. CONTRAST:  179mL OMNIPAQUE IOHEXOL 350 MG/ML SOLN COMPARISON:  Lumbar spine radiographs 06/02/2015. FINDINGS: Lumbar spine is imaged from the midbody of T11 through L5 to S1. Vertebral body heights are maintained. Grade 1 anterolisthesis at L5-S1 measures L5-S1 measures 6.5 mm. This appears to be degenerative. Atherosclerotic calcifications are present in the aorta without aneurysm. Surgical clips are present at the gallbladder fossa. Limited imaging of the abdomen is otherwise unremarkable. T12-L1: Mild endplate  degenerative changes are present. There is no significant stenosis. L1-2:  Negative. L2-3: Mild facet degenerative changes are noted. A leftward disc bulge is present. There is no significant stenosis. L3-4: A mild leftward disc protrusion is present. Mild to moderate facet hypertrophy is worse on the right. There is no significant stenosis. L4-5: Advanced facet hypertrophy is present. A leftward disc protrusion is present. Mild subarticular and foraminal narrowing is worse on the left. L5-S1: There is uncovering of the leftward disc protrusion. The facets are fused. There is no significant stenosis. IMPRESSION: 1. 6.5 mm anterolisthesis at L5-S1 with fusion of the posterior elements. There is no focal stenosis at this level. 2. Advanced facet hypertrophy and a leftward disc protrusion at L4-5 results in mild subarticular and foraminal narrowing, worse on the left. 3. Leftward disc protrusion and right greater than left facet hypertrophy at L3-4 without significant focal stenosis. Electronically Signed   By: San Morelle M.D.   On: 06/08/2015 15:31    Assessment/Plan: Sabena A Ingemi is a 64 y.o. female with DM, Htn, anxiety, depression initially admitted with falls and Low back pain as well as + troponins and wbc 12.7. She spiked temp to 102.3 and bcx done grew MS S aureus. CT chest shows no PE but some vol loss and cirrhosis. Likely source was cellulitis in thigh. She did not have MRI done of back. Dced on IV ancef for planned 2-6 week course. She was readmitted soon after dc with confusion and tachycardia. Temp was elevated and fu BCX are again +. Picc was placed 11/18. Bcx done 11/16 just turned + 11/21 (today)  Recommendations Bacteremia- I suspect the picc was placed while she was still  bacteremia.  (Picc placed 11/18 but cx done 11/16 turned + afterwards) TEE neg. PICC removed 11/21. BCx 11/20 negative to date  -cont ancef Replace picc once bcx negative > 72 hours Then will plan on  6 weeks IV ancef given persistent bacteremia, back pain with inability to get MRI. Thank you very much for the consult. Will follow with you.  [place], [place]   06/09/2015, 3:17 PM

## 2015-06-09 NOTE — Evaluation (Signed)
Physical Therapy Evaluation Patient Details Name: Sharon Kirby MRN: [id number] DOB: 01-31-1951 Today's Date: 06/09/2015   History of Present Illness  Pt presented from [facility] due to falls, low back pain, and confusion. She was admitted for sepsis, acute metabolic encephalopathy, and hyponatremia. She was recently at [facility] and discharged to SNF. She was at [facility] for less than 24 hours before returning. During prior hospital visit pt presented to ER after fall 3 days prior with significant R knee pain and inability to mobilize in home environment since.  R knee x-ray negative for acute injury; lumbar spine negative for acute injury, significant for 48mm anterolisthesis L5-S1. Attempted to perform evaluation late yesterday afternoon but she was covered in stool and staff was unable to immediately assist in cleaning patient. During evaluation today pt is AOx2 so history supplemented from medical record  Clinical Impression  Pt presents with severe deconditioning due to prolonged bed rest and comorbid conditions. She requires total assist for bed mobility and attempted transfers due to poor command follow, weakness, and poor sitting balance. Pt is unable to stand or ambulate on this date. She does complete bed exercises and overall cognition appears slightly improved from attempted evaluation yesterday PM. No family present to supplement history so medical record utilized for most information. Pt will need to return to SNF at discharge in order to safely return to prior level of function. Pt will benefit from skilled PT services to address deficits in strength, balance, and mobility in order to return to full function at home.     Follow Up Recommendations SNF    Equipment Recommendations  None recommended by PT    Recommendations for Other Services       Precautions / Restrictions Precautions Precautions: Fall Restrictions Weight Bearing Restrictions: No       Mobility  Bed Mobility Overal bed mobility: +2 for physical assistance;Needs Assistance Bed Mobility: Supine to Sit;Sit to Supine     Supine to sit: Total assist;+2 for physical assistance Sit to supine: Total assist;+2 for physical assistance   General bed mobility comments: Pt is extremely weak with poor command follow and poor ability to stay upright at EOB  Transfers Overall transfer level: Needs assistance Equipment used: Rolling walker (2 wheeled) Transfers: Sit to/from Stand Sit to Stand: +2 physical assistance;Total assist         General transfer comment: Pt unable to come to upright standing despite heavy assist from therapist +2. Pt is very deconditioned in LEs with poor command follow and poor safety awareness. Unable to ambulate at this time  Ambulation/Gait             General Gait Details: Unable  Stairs            Wheelchair Mobility    Modified Rankin (Stroke Patients Only)       Balance Overall balance assessment: Needs assistance   Sitting balance-Leahy Scale: Poor Sitting balance - Comments: Pt requires heavy assist to remain upright at EOB alone with bilateral UE support     Standing balance-Leahy Scale: Poor                               Pertinent Vitals/Pain Pain Assessment: 0-10 Pain Score: 6  Pain Location: Bilateral knees Pain Intervention(s): Limited activity within patient's tolerance;Monitored during session    Home Living Family/patient expects to be discharged to:: Skilled nursing facility Living Arrangements: Children Available Help  at Discharge: Family;Available 24 hours/day Type of Home: House Home Access: Ramped entrance (+1 step)     Home Layout: One level Home Equipment: Walker - 2 wheels;Walker - 4 wheels;Cane - single point;Electric scooter;Hospital bed;Shower seat - built in      Prior Function Level of Independence: Needs assistance   Gait / Transfers Assistance Needed: Patient was  ambulating short distances (bed to bathroom) with assistance and rollator (per previous documentation)  ADL's / Homemaking Assistance Needed: Patient was requiring assistance with preparing meals/bathing/toileting, etc. (per previous documentation)  Comments: Since fall prior to admission, unable for OOB or household mobilization due to back and R knee pain     Hand Dominance        Extremity/Trunk Assessment   Upper Extremity Assessment: RUE deficits/detail;LUE deficits/detail RUE Deficits / Details: Bilateral severe loss of shoulder flexion AROM with pain. Elbow and hand strength appears grossly deconditioned         Lower Extremity Assessment: Generalized weakness;RLE deficits/detail;LLE deficits/detail RLE Deficits / Details: grossly at least 3/5 strength all movements, not specifically tested, no apparent sensation or coordination deficits; may present with functional limitations during activity.       Communication   Communication: No difficulties  Cognition Arousal/Alertness: Lethargic Behavior During Therapy: Anxious Overall Cognitive Status: No family/caregiver present to determine baseline cognitive functioning (Pt is AOx2 at time of evaluation)                      General Comments      Exercises General Exercises - Lower Extremity Ankle Circles/Pumps: Strengthening;Both;Supine;10 reps Heel Slides: Strengthening;Both;10 reps;Supine Hip ABduction/ADduction: Strengthening;Both;10 reps;Supine Straight Leg Raises: Strengthening;Both;10 reps;Supine      Assessment/Plan    PT Assessment Patient needs continued PT services  PT Diagnosis Difficulty walking;Generalized weakness;Acute pain   PT Problem List Decreased strength;Decreased range of motion;Decreased activity tolerance;Decreased balance;Decreased mobility;Decreased knowledge of use of DME;Pain;Decreased safety awareness;Decreased cognition;Obesity  PT Treatment Interventions DME instruction;Stair  training;Gait training;Functional mobility training;Therapeutic activities;Therapeutic exercise;Balance training;Patient/family education;Cognitive remediation;Neuromuscular re-education;Wheelchair mobility training   PT Goals (Current goals can be found in the Care Plan section) Acute Rehab PT Goals Patient Stated Goal: "I want to keep trying to stand up" PT Goal Formulation: With patient Time For Goal Achievement: 06/16/15 Potential to Achieve Goals: Fair    Frequency Min 2X/week   Barriers to discharge Decreased caregiver support      Co-evaluation               End of Session Equipment Utilized During Treatment: Gait belt Activity Tolerance: Patient limited by fatigue Patient left: in bed;with bed alarm set;with call bell/phone within reach           Time: 1140-1205 PT Time Calculation (min) (ACUTE ONLY): 25 min   Charges:   PT Evaluation $Initial PT Evaluation Tier I: 1 Procedure PT Treatments $Therapeutic Exercise: 8-22 mins   PT G Codes:       Lyndel Safe Huprich PT, DPT   Huprich,Jason 06/09/2015, 1:46 PM

## 2015-06-09 NOTE — Progress Notes (Signed)
[facility] [place] NAME: Sharon Kirby    MR#:  [id number]  DATE OF BIRTH:  [birth year]  SUBJECTIVE:  CHIEF COMPLAINT:   Chief Complaint  Patient presents with  . Altered Mental Status  . Tachycardia   Sleeping comfortably. Tearful on waking her up saying she hurts all over.  Confused.  PICC line removed REVIEW OF SYSTEMS:  Review of Systems  Constitutional: Negative for fever, weight loss, malaise/fatigue and diaphoresis.  HENT: Negative for ear discharge, ear pain, hearing loss, nosebleeds, sore throat and tinnitus.   Eyes: Negative for blurred vision and pain.  Respiratory: Negative for cough, hemoptysis, shortness of breath and wheezing.   Cardiovascular: Negative for chest pain, palpitations, orthopnea and leg swelling.  Gastrointestinal: Negative for heartburn, nausea, vomiting, abdominal pain, diarrhea, constipation and blood in stool.  Genitourinary: Negative for dysuria, urgency and frequency.  Musculoskeletal: Positive for myalgias, back pain, joint pain and neck pain.  Skin: Negative for itching and rash.  Neurological: Negative for dizziness, tingling, tremors, focal weakness, seizures, weakness and headaches.  Psychiatric/Behavioral: Negative for depression. The patient is not nervous/anxious.    DRUG ALLERGIES:   Allergies  Allergen Reactions  . Sulfa Antibiotics Anaphylaxis  . Penicillins Rash and Other (See Comments)    Has patient had a PCN reaction causing immediate rash, facial/tongue/throat swelling, SOB or lightheadedness with hypotension: No Has patient had a PCN reaction causing severe rash involving mucus membranes or skin necrosis: No Has patient had a PCN reaction that required hospitalization No Has patient had a PCN reaction occurring within the last 10 years: Yes If all of the above answers are "NO", then may proceed with Cephalosporin use.   VITALS:  Blood pressure 109/52, pulse 78,  temperature 98 F (36.7 C), temperature source Oral, resp. rate 18, height 5\' 1"  (1.549 m), weight 140.434 kg (309 lb 9.6 oz), SpO2 97 %. PHYSICAL EXAMINATION:  Physical Exam   GENERAL:  64 y.o.-year-old patient lying in the bed , tearful. Morbidly obese. EYES: Pupils equal, round, reactive to light and accommodation. No scleral icterus. Extraocular muscles intact.  HEENT: Head atraumatic, normocephalic. Oropharynx and nasopharynx clear.  NECK:  Supple, no jugular venous distention. No thyroid enlargement, no tenderness.  LUNGS: Normal breath sounds bilaterally, no wheezing, rales, rhonchi. No use of accessory muscles of respiration.  CARDIOVASCULAR: S1, S2 normal. No murmurs, rubs, or gallops.  ABDOMEN: Soft, nontender, nondistended. Bowel sounds present. No organomegaly or mass. Rectal tube EXTREMITIES: No cyanosis, clubbing or edema b/l.    NEUROLOGIC: Cranial nerves II through XII are intact. No focal Motor or sensory deficits b/l.   PSYCHIATRIC: The patient is drowzy, confused. SKIN: No obvious rash, lesion, or ulcer.    LABORATORY PANEL:   CBC  Recent Labs Lab 06/09/15 0719  WBC 15.6*  HGB 8.8*  HCT 26.9*  PLT 130*   ------------------------------------------------------------------------------------------------------------------ Chemistries   Recent Labs Lab 06/09/15 0422  NA 134*  K 4.2  CL 109  CO2 18*  GLUCOSE 144*  BUN 32*  CREATININE 1.14*  CALCIUM 8.4*  AST 79*  ALT 57*  ALKPHOS 378*  BILITOT 1.1   RADIOLOGY:  Ct Lumbar Spine W Contrast  06/08/2015  CLINICAL DATA:  Severe low back pain.  Recent MSSA infection. EXAM: CT LUMBAR SPINE WITH CONTRAST TECHNIQUE: Multidetector CT imaging of the lumbar spine was performed with intravenous contrast administration. Multiplanar CT image reconstructions were also generated. CONTRAST:  18mL OMNIPAQUE IOHEXOL 350 MG/ML SOLN COMPARISON:  Lumbar spine radiographs 06/02/2015. FINDINGS: Lumbar spine is imaged from the  midbody of T11 through L5 to S1. Vertebral body heights are maintained. Grade 1 anterolisthesis at L5-S1 measures L5-S1 measures 6.5 mm. This appears to be degenerative. Atherosclerotic calcifications are present in the aorta without aneurysm. Surgical clips are present at the gallbladder fossa. Limited imaging of the abdomen is otherwise unremarkable. T12-L1: Mild endplate degenerative changes are present. There is no significant stenosis. L1-2:  Negative. L2-3: Mild facet degenerative changes are noted. A leftward disc bulge is present. There is no significant stenosis. L3-4: A mild leftward disc protrusion is present. Mild to moderate facet hypertrophy is worse on the right. There is no significant stenosis. L4-5: Advanced facet hypertrophy is present. A leftward disc protrusion is present. Mild subarticular and foraminal narrowing is worse on the left. L5-S1: There is uncovering of the leftward disc protrusion. The facets are fused. There is no significant stenosis. IMPRESSION: 1. 6.5 mm anterolisthesis at L5-S1 with fusion of the posterior elements. There is no focal stenosis at this level. 2. Advanced facet hypertrophy and a leftward disc protrusion at L4-5 results in mild subarticular and foraminal narrowing, worse on the left. 3. Leftward disc protrusion and right greater than left facet hypertrophy at L3-4 without significant focal stenosis. Electronically Signed   By: San Morelle M.D.   On: 06/08/2015 15:31   ASSESSMENT AND PLAN:  64 y.o. female with a known history of diabetic mellitus, hypertension, anxiety and depression and multiple other medical problems is brought into the ED due to fever, confusion. Recent admission for MSSA bacteremia  * GPCC bacteremia Recent MSSA bacteremia on Cefazolin thru PICC. Discharged on 11/18. Bcx again growing MSSA. On cefazolin Will request ID consult. Discussed with Dr. Ola Spurr TEE showed no endocarditis. MRI lumbar could not be done due to her  obesity. CT lumbar with contrast showed no abcess/infection. Repeat Bcx from 11/20 pending.  * Cirrhosis of Liver with hepatic encephalopathy On lactulose  * Chronic pain syndrome  * Diabetes mellitus Continue sliding scale insulin  * Essential hypertension Continue home medication list no pedal and titrate as needed basis  * DVT prophylaxis with Heprain SQ  * History of depression and anxiety Continue home medications Klonopin  All the records are reviewed and case discussed with Care Management/Social Worker. Management plans discussed with the patient, family and they are in agreement.  CODE STATUS: Full Code  TOTAL TIME TAKING CARE OF THIS PATIENT: 35 minutes.    Hillary Bow R M.D on 06/09/2015 at 12:56 PM  Between 7am to 6pm - Pager - [phone]  After 6pm go to [email or web address] - password [email or web address] [facility]  Office  [phone]  CC: Primary care physician; Madelyn Brunner, MD

## 2015-06-10 LAB — BASIC METABOLIC PANEL
ANION GAP: 10 (ref 5–15)
BUN: 51 mg/dL — ABNORMAL HIGH (ref 6–20)
CALCIUM: 8.8 mg/dL — AB (ref 8.9–10.3)
CO2: 16 mmol/L — ABNORMAL LOW (ref 22–32)
Chloride: 112 mmol/L — ABNORMAL HIGH (ref 101–111)
Creatinine, Ser: 2.05 mg/dL — ABNORMAL HIGH (ref 0.44–1.00)
GFR, EST AFRICAN AMERICAN: 28 mL/min — AB (ref >60)
GFR, EST NON AFRICAN AMERICAN: 24 mL/min — AB (ref >60)
GLUCOSE: 184 mg/dL — AB (ref 65–99)
Potassium: 5.2 mmol/L — ABNORMAL HIGH (ref 3.5–5.1)
Sodium: 138 mmol/L (ref 135–145)

## 2015-06-10 LAB — CBC WITH DIFFERENTIAL/PLATELET
Basophils Absolute: 0.1 10*3/uL (ref 0–0.1)
Basophils Relative: 1 %
Eosinophils Absolute: 0.2 10*3/uL (ref 0–0.7)
Eosinophils Relative: 1 %
HEMATOCRIT: 29.2 % — AB (ref 35.0–47.0)
Hemoglobin: 9.7 g/dL — ABNORMAL LOW (ref 12.0–16.0)
LYMPHS ABS: 1.2 10*3/uL (ref 1.0–3.6)
LYMPHS PCT: 8 %
MCH: 29.8 pg (ref 26.0–34.0)
MCHC: 33.3 g/dL (ref 32.0–36.0)
MCV: 89.4 fL (ref 80.0–100.0)
MONO ABS: 0.8 10*3/uL (ref 0.2–0.9)
MONOS PCT: 5 %
NEUTROS ABS: 13.6 10*3/uL — AB (ref 1.4–6.5)
Neutrophils Relative %: 85 %
Platelets: 118 10*3/uL — ABNORMAL LOW (ref 150–440)
RBC: 3.27 MIL/uL — ABNORMAL LOW (ref 3.80–5.20)
RDW: 23.7 % — AB (ref 11.5–14.5)
WBC: 15.8 10*3/uL — ABNORMAL HIGH (ref 3.6–11.0)

## 2015-06-10 LAB — GLUCOSE, CAPILLARY
GLUCOSE-CAPILLARY: 132 mg/dL — AB (ref 65–99)
GLUCOSE-CAPILLARY: 140 mg/dL — AB (ref 65–99)
Glucose-Capillary: 125 mg/dL — ABNORMAL HIGH (ref 65–99)
Glucose-Capillary: 167 mg/dL — ABNORMAL HIGH (ref 65–99)

## 2015-06-10 LAB — CREATININE, SERUM
Creatinine, Ser: 1.8 mg/dL — ABNORMAL HIGH (ref 0.44–1.00)
GFR calc non Af Amer: 29 mL/min — ABNORMAL LOW (ref >60)
GFR, EST AFRICAN AMERICAN: 33 mL/min — AB (ref >60)

## 2015-06-10 LAB — CULTURE, BLOOD (ROUTINE X 2): Culture: NO GROWTH

## 2015-06-10 MED ORDER — RIFAXIMIN 550 MG PO TABS
550.0000 mg | ORAL_TABLET | Freq: Two times a day (BID) | ORAL | Status: DC
  Administered 2015-06-10 – 2015-06-15 (×11): 550 mg via ORAL
  Filled 2015-06-10 (×11): qty 1

## 2015-06-10 MED ORDER — SODIUM CHLORIDE 0.9 % IV SOLN
INTRAVENOUS | Status: DC
  Administered 2015-06-10 – 2015-06-12 (×4): via INTRAVENOUS

## 2015-06-10 NOTE — Care Management Important Message (Signed)
Important Message  Patient Details  Name: Sharon Kirby MRN: [id number] Date of Birth: Nov 27, 1950   Medicare Important Message Given:  Yes    [name] 06/10/2015, 10:02 AM

## 2015-06-10 NOTE — Progress Notes (Signed)
[facility] INFECTIOUS DISEASE PROGRESS NOTE Date of Admission:  06/06/2015     ID: Sharon Kirby is a 64 y.o. female with MSSA bacteremia  Principal Problem:   Sepsis ([place]) Active Problems:   Bacterial endocarditis   Pressure ulcer   Subjective: No fevers, back pain stable. WBC still 15. More awake. eating  ROS  Eleven systems are reviewed and negative except per hpi  Medications:  Antibiotics Given (last 72 hours)    Date/Time Action Medication Dose Rate   06/07/15 2344 Given   vancomycin (VANCOCIN) IVPB 1000 mg/200 mL premix 1,000 mg 200 mL/hr   06/08/15 1143 Given   vancomycin (VANCOCIN) IVPB 1000 mg/200 mL premix 1,000 mg 200 mL/hr   06/08/15 1550 Given   ceFAZolin (ANCEF) IVPB 2 g/50 mL premix 2 g 100 mL/hr   06/08/15 2232 Given   ceFAZolin (ANCEF) IVPB 2 g/50 mL premix 2 g 100 mL/hr   06/09/15 0514 Given   ceFAZolin (ANCEF) IVPB 2 g/50 mL premix 2 g 100 mL/hr   06/09/15 1350 Given   ceFAZolin (ANCEF) IVPB 2 g/50 mL premix 2 g 100 mL/hr   06/09/15 2131 Given   ceFAZolin (ANCEF) IVPB 2 g/50 mL premix 2 g 100 mL/hr   06/10/15 0644 Given   ceFAZolin (ANCEF) IVPB 2 g/50 mL premix 2 g 100 mL/hr     . aspirin EC  81 mg Oral Daily  .  ceFAZolin (ANCEF) IV  2 g Intravenous 3 times per day  . clonazePAM  1 mg Oral BID  . ferrous sulfate  325 mg Oral BID WC  . heparin  5,000 Units Subcutaneous 3 times per day  . insulin aspart  0-5 Units Subcutaneous QHS  . insulin aspart  0-9 Units Subcutaneous TID WC  . lisinopril  10 mg Oral Daily  . loratadine  10 mg Oral Daily  . metoprolol tartrate  25 mg Oral BID  . pantoprazole  40 mg Oral Daily  . PARoxetine  40 mg Oral Daily  . rifaximin  550 mg Oral BID  . zinc gluconate  50 mg Oral Daily    Objective: Vital signs in last 24 hours: Temp:  [98 F (36.7 C)-98.4 F (36.9 C)] 98 F (36.7 C) (11/23 1349) Pulse Rate:  [64-96] 78 (11/23 1349) Resp:  [18-19] 18 (11/23 1349) BP: (90-150)/(38-84) 127/55 mmHg (11/23  1349) SpO2:  [96 %-98 %] 96 % (11/23 1349) Constitutional: morbidly obese, lying in bed, awake, able to converse, knows at [facility].  HENT: [place]/AT, PERRLA, no scleral icterus Mouth/Throat: Oropharynx is clear and dry . No oropharyngeal exudate.  Cardiovascular: Normal rate, regular rhythm and normal heart sounds.  Pulmonary/Chest: Effort normal and breath sounds normal. No respiratory distress. has no wheezes.  Neck supple, no nuchal rigidity Abdominal: obese Soft. Bowel sounds are normal. exhibits no distension. There is no tenderness.  Lymphadenopathy: no cervical adenopathy. No axillary adenopathy Neurological: alert and interactive, knows Nov and thanksgiving tomorrow with prompting, knows at [facility] Skin: brusiing on arms, legs Mild skin breakdown in groin and upper thighs - R upper post thigh with 1x 2 cm shallow ulcer with clean base Psychiatric: more awake than prior   Lab Results  Recent Labs  06/09/15 0422 06/09/15 0719 06/10/15 0502 06/10/15 0740  WBC  --  15.6*  --  15.8*  HGB  --  8.8*  --  9.7*  HCT  --  26.9*  --  29.2*  NA 134*  --   --   --  K 4.2  --   --   --   CL 109  --   --   --   CO2 18*  --   --   --   BUN 32*  --   --   --   CREATININE 1.14*  --  1.80*  --     Microbiology:  Results for orders placed or performed during the hospital encounter of 06/06/15  Culture, blood (routine x 2)     Status: None   Collection Time: 06/06/15 11:05 AM  Result Value Ref Range Status   Specimen Description BLOOD RIGHT PICC  Final   Special Requests   Final    BOTTLES DRAWN AEROBIC AND ANAEROBIC ANAEROBIC 3ML AEROBIC 6ML   Culture  Setup Time   Final    GRAM POSITIVE COCCI IN CLUSTERS IN BOTH AEROBIC AND ANAEROBIC BOTTLES CRITICAL RESULT CALLED TO, READ BACK BY AND VERIFIED WITH: EMILY HARRON AT [id number] ON 06/07/15 CTJ    Culture   Final    STAPHYLOCOCCUS AUREUS IN BOTH AEROBIC AND ANAEROBIC BOTTLES    Report Status 06/09/2015 FINAL  Final   Organism ID,  Bacteria STAPHYLOCOCCUS AUREUS  Final      Susceptibility   Staphylococcus aureus - MIC*    CIPROFLOXACIN >=8 RESISTANT Resistant     ERYTHROMYCIN 4 RESISTANT Resistant     GENTAMICIN <=0.5 SENSITIVE Sensitive     OXACILLIN <=0.25 SENSITIVE Sensitive     TRIMETH/SULFA <=10 SENSITIVE Sensitive     CLINDAMYCIN <=0.25 RESISTANT Resistant     CEFOXITIN SCREEN NEGATIVE Sensitive     Inducible Clindamycin Value in next row Resistant      POSITIVEINDUCIBLE CLINDAMYCIN RESISTANCE - A positive ICR test is indicative of inducible resistance to macrolides, lincosamides, and type B streptogramin.  This isolate is presumed to be resistant to Clindamycin, however, Clindamycin may still be effective in some patients.     TETRACYCLINE Value in next row Sensitive      SENSITIVE<=1    * STAPHYLOCOCCUS AUREUS  Urine culture     Status: None   Collection Time: 06/06/15 11:05 AM  Result Value Ref Range Status   Specimen Description URINE, RANDOM  Final   Special Requests NONE  Final   Culture NO GROWTH 2 DAYS  Final   Report Status 06/08/2015 FINAL  Final  Culture, blood (routine x 2)     Status: None   Collection Time: 06/06/15 11:48 AM  Result Value Ref Range Status   Specimen Description BLOOD LEFT AC  Final   Special Requests   Final    BOTTLES DRAWN AEROBIC AND ANAEROBIC  AER 3CC ANA 1CC   Culture  Setup Time   Final    GRAM POSITIVE COCCI IN CLUSTERS IN BOTH AEROBIC AND ANAEROBIC BOTTLES CRITICAL RESULT CALLED TO, READ BACK BY AND VERIFIED WITH: EMILY HARRON AT [id number] ON 06/07/15 CTJ    Culture   Final    STAPHYLOCOCCUS AUREUS IN BOTH AEROBIC AND ANAEROBIC BOTTLES    Report Status 06/09/2015 FINAL  Final   Organism ID, Bacteria STAPHYLOCOCCUS AUREUS  Final      Susceptibility   Staphylococcus aureus - MIC*    CIPROFLOXACIN >=8 RESISTANT Resistant     ERYTHROMYCIN 4 RESISTANT Resistant     GENTAMICIN <=0.5 SENSITIVE Sensitive     OXACILLIN <=0.25 SENSITIVE Sensitive     TRIMETH/SULFA <=10  SENSITIVE Sensitive     CLINDAMYCIN <=0.25 RESISTANT Resistant     CEFOXITIN SCREEN NEGATIVE Sensitive  Inducible Clindamycin Value in next row Resistant      POSITIVEINDUCIBLE CLINDAMYCIN RESISTANCE - A positive ICR test is indicative of inducible resistance to macrolides, lincosamides, and type B streptogramin.  This isolate is presumed to be resistant to Clindamycin, however, Clindamycin may still be effective in some patients.     TETRACYCLINE Value in next row Sensitive      SENSITIVE<=1    * STAPHYLOCOCCUS AUREUS  C difficile quick scan w PCR reflex     Status: None   Collection Time: 06/06/15 11:48 AM  Result Value Ref Range Status   C Diff antigen NEGATIVE NEGATIVE Final   C Diff toxin NEGATIVE NEGATIVE Final   C Diff interpretation Negative for C. difficile  Final  Culture, blood (routine x 2)     Status: None (Preliminary result)   Collection Time: 06/07/15 12:08 PM  Result Value Ref Range Status   Specimen Description BLOOD LEFT WRIST  Final   Special Requests BOTTLES DRAWN AEROBIC AND ANAEROBIC  1CC  Final   Culture NO GROWTH 3 DAYS  Final   Report Status PENDING  Incomplete  Culture, blood (routine x 2)     Status: None (Preliminary result)   Collection Time: 06/07/15 12:08 PM  Result Value Ref Range Status   Specimen Description BLOOD PICC LINE  Final   Special Requests   Final    BOTTLES DRAWN AEROBIC AND ANAEROBIC  AER [place] ANA [place]   Culture NO GROWTH 3 DAYS  Final   Report Status PENDING  Incomplete    Studies/Results:  Dg Chest 1 View  06/05/2015  CLINICAL DATA:  Right-sided PICC placement EXAM: CHEST 1 VIEW COMPARISON:  06/02/2015 FINDINGS: Right PICC tip projects in the lower superior vena cava, well positioned. Lung volumes remain low. There is mild basilar atelectasis. No convincing pneumonia or edema. No pleural effusion or pneumothorax. Cardiac silhouette is mildly enlarged. IMPRESSION: 1. Right PICC is well positioned with its tip in the lower superior  vena cava. 2. No acute findings in the lungs. No other change from the prior study. Electronically Signed   By: Lajean Manes M.D.   On: 06/05/2015 10:49   Dg Chest 2 View  06/02/2015  CLINICAL DATA:  Back pain, chronic right leg pain EXAM: CHEST  2 VIEW COMPARISON:  None. FINDINGS: Cardiomegaly is noted. There is elevation of the right hemidiaphragm. No acute infiltrate or pleural effusion. No pulmonary edema. Mild degenerative changes mid and lower thoracic spine. IMPRESSION: Cardiomegaly. No active disease. Elevation of the right hemidiaphragm. Electronically Signed   By: Lahoma Crocker M.D.   On: 06/02/2015 14:58   Dg Lumbar Spine Complete  06/02/2015  CLINICAL DATA:  Back pain, chronic right leg pain EXAM: LUMBAR SPINE - COMPLETE 4+ VIEW COMPARISON:  None. FINDINGS: Four views of lumbar spine submitted. There is moderate disc space flattening at L5-S1 level. About 8 mm anterolisthesis L5 on S1 vertebral body. Facet degenerative changes L4 and L5 level. Disc space flattening with mild anterior spurring and endplate sclerotic changes at T12-L1 level. No acute fracture. Moderate gas throughout the colon. IMPRESSION: No acute fracture. There is about 8 mm anterolisthesis L5 on S1 vertebral body. Disc space flattening at T12-L1 and L5-S1 level. Electronically Signed   By: Lahoma Crocker M.D.   On: 06/02/2015 15:00   Ct Head Wo Contrast  06/06/2015  CLINICAL DATA:  Pt presents from [facility] with c/o AMS. Per EMS report pt was d/c from hospital yesterday for sepsis and  admitted to [facility]. Pt tachycardic upon arrival with incomprehensible speech. Daughter states pt history of brain tumor, seizures and stroke. EXAM: CT HEAD WITHOUT CONTRAST TECHNIQUE: Contiguous axial images were obtained from the base of the skull through the vertex without intravenous contrast. COMPARISON:  05/11/2015 FINDINGS: Bilateral ventriculostomy is extend from the parietal bones to the anterior aspects of both  lateral ventricles, without change. Ventricles are enlarged, similar to the prior study, consistent with diffuse atrophy. There is no convincing hydrocephalus. There are no parenchymal masses or mass effect. There is no evidence of a cortical infarct. There are no extra-axial masses or abnormal fluid collections. There is no intracranial hemorrhage. The visualized sinuses and mastoid air cells are clear. IMPRESSION: 1. No acute intracranial abnormalities. 2. Bilateral ventriculostomy is are stable. The degree of ventricular enlargement is stable. Electronically Signed   By: Lajean Manes M.D.   On: 06/06/2015 12:36   Ct Head Wo Contrast  05/11/2015  CLINICAL DATA:  64 year old female with previous shunt placement and new daily persistent headaches. Initial encounter. EXAM: CT HEAD WITHOUT CONTRAST TECHNIQUE: Contiguous axial images were obtained from the base of the skull through the vertex without intravenous contrast. COMPARISON:  Noncontrast head CT 04/14/2012. FINDINGS: Bilateral posterior ventriculostomy catheters track through the lateral ventricles and appears stable in configuration since 2013. In the occipital scalp soft tissues in the midline these appear connected to a right cisterna magna catheter which courses into the dorsal left CSF space at the level of the cervicomedullary junction. The tip of this catheter is not included, but visualized portions appear stable since 2013. Lateral ventricle size and configuration is stable. The temporal horns are decompressed as before. The third and fourth ventricles are decompressed as before. The cerebral aqueduct appears stable. No intracranial mass or mass effect identified. Dystrophic basal ganglia and right cerebellar nucleus calcifications greater on the left are stable. No acute intracranial hemorrhage identified. No cortically based acute infarct identified. Calcified atherosclerosis at the skull base. Gray-white matter differentiation is stable and  within normal limits. Stable visualized osseous structures. Visualized paranasal sinuses and mastoids are clear. Stable visualized orbit and scalp soft tissues. IMPRESSION: Stable CT appearance of the brain since 2013. No acute intracranial abnormality. Electronically Signed   By: Genevie Ann M.D.   On: 05/11/2015 15:55   Ct Angio Chest Pe W/cm &/or Wo Cm  06/02/2015  CLINICAL DATA:  Shortness of breath with elevated troponin and back pain. EXAM: CT ANGIOGRAPHY CHEST WITH CONTRAST TECHNIQUE: Multidetector CT imaging of the chest was performed using the standard protocol during bolus administration of intravenous contrast. Multiplanar CT image reconstructions and MIPs were obtained to evaluate the vascular anatomy. CONTRAST:  72mL OMNIPAQUE IOHEXOL 350 MG/ML SOLN COMPARISON:  Chest radiograph 06/02/2015 FINDINGS: No evidence for a pulmonary embolism. No significant pericardial or pleural fluid. Subcarinal tissue measures up to 1.3 cm in the short axis. Otherwise, there is no significant chest lymphadenopathy. There is a small amount of perihepatic ascites and the liver has a nodular contour. In addition, the spleen is enlarged measuring 18.2 cm in the AP dimension. Gallbladder has been removed. Small lymph nodes versus varices in the gastrohepatic ligament region. Cannot exclude small esophageal varices. Limited evaluation of the portal venous system on this examination. The trachea and mainstem bronchi are patent. Volume loss in the right lower lobe. There is also volume loss in the left lower lobe. No significant airspace disease or consolidation in the lungs. Degenerative changes at the glenohumeral joints are  incompletely visualized. No acute bone abnormality. Review of the MIP images confirms the above findings. IMPRESSION: No evidence for a pulmonary embolism. Volume loss in the lower lobes, right side greater the left. Cirrhosis with portal hypertension. Portal hypertension demonstrated by perihepatic ascites,  splenomegaly and possible varices. Electronically Signed   By: Markus Daft M.D.   On: 06/02/2015 17:22   Ct Lumbar Spine W Contrast  06/08/2015  CLINICAL DATA:  Severe low back pain.  Recent MSSA infection. EXAM: CT LUMBAR SPINE WITH CONTRAST TECHNIQUE: Multidetector CT imaging of the lumbar spine was performed with intravenous contrast administration. Multiplanar CT image reconstructions were also generated. CONTRAST:  119mL OMNIPAQUE IOHEXOL 350 MG/ML SOLN COMPARISON:  Lumbar spine radiographs 06/02/2015. FINDINGS: Lumbar spine is imaged from the midbody of T11 through L5 to S1. Vertebral body heights are maintained. Grade 1 anterolisthesis at L5-S1 measures L5-S1 measures 6.5 mm. This appears to be degenerative. Atherosclerotic calcifications are present in the aorta without aneurysm. Surgical clips are present at the gallbladder fossa. Limited imaging of the abdomen is otherwise unremarkable. T12-L1: Mild endplate degenerative changes are present. There is no significant stenosis. L1-2:  Negative. L2-3: Mild facet degenerative changes are noted. A leftward disc bulge is present. There is no significant stenosis. L3-4: A mild leftward disc protrusion is present. Mild to moderate facet hypertrophy is worse on the right. There is no significant stenosis. L4-5: Advanced facet hypertrophy is present. A leftward disc protrusion is present. Mild subarticular and foraminal narrowing is worse on the left. L5-S1: There is uncovering of the leftward disc protrusion. The facets are fused. There is no significant stenosis. IMPRESSION: 1. 6.5 mm anterolisthesis at L5-S1 with fusion of the posterior elements. There is no focal stenosis at this level. 2. Advanced facet hypertrophy and a leftward disc protrusion at L4-5 results in mild subarticular and foraminal narrowing, worse on the left. 3. Leftward disc protrusion and right greater than left facet hypertrophy at L3-4 without significant focal stenosis. Electronically  Signed   By: San Morelle M.D.   On: 06/08/2015 15:31   Dg Chest Port 1 View  06/06/2015  CLINICAL DATA:  Confusion. EXAM: PORTABLE CHEST 1 VIEW COMPARISON:  06/05/2015 FINDINGS: Rotational artifact. Right arm PICC line is noted with tip in the SVC. Heart size is moderately enlarged. The lung volumes are low. Pulmonary vascular congestion noted. There is no pleural effusion or edema. IMPRESSION: 1. Cardiac enlargement and low lung volumes. 2. Pulmonary vascular congestion. Electronically Signed   By: Kerby Moors M.D.   On: 06/06/2015 12:22   Dg Knee Complete 4 Views Right  05/31/2015  CLINICAL DATA:  Fall onto right knee 3 days ago with severe pain with weight-bearing. Initial encounter. EXAM: RIGHT KNEE - COMPLETE 4+ VIEW COMPARISON:  None. FINDINGS: A total knee arthroplasty is well seated. No periprosthetic fracture or malalignment. No evidence of joint effusion. Osteopenic appearance of the bones. IMPRESSION: 1. No acute finding. 2. Unremarkable total knee arthroplasty. Electronically Signed   By: Monte Fantasia M.D.   On: 05/31/2015 04:21    Assessment/Plan: Sharon Kirby is a 64 y.o. female with DM, Htn, anxiety, depression initially admitted with falls and Low back pain as well as + troponins and wbc 12.7. She spiked temp to 102.3 and bcx done grew MS S aureus. CT chest shows no PE but some vol loss and cirrhosis. Likely source was cellulitis in thigh. She did not have MRI done of back. Dced on IV ancef for planned 2-6 week  course. She was readmitted soon after dc with confusion and tachycardia. Temp was elevated and fu BCX are again +. Picc was placed 11/18. Bcx done 11/16 just turned + 11/21  Recommendations Bacteremia- I suspect the picc was placed while she was still bacteremia.  (Picc placed 11/18 but cx done 11/16 turned + afterwards) TEE neg. PICC removed 11/21. BCx 11/20 negative to date -cont ancef Replace picc once bcx negative > 72 hours - can place  11/24 Then will plan on 6 weeks IV ancef given persistent bacteremia, back pain with inability to get MRI. I have filled out abx order sheet. I can see after dc in 3 weeks Thank you very much for the consult. Will follow with you.  [place], [place]   06/10/2015, 2:19 PM

## 2015-06-10 NOTE — Progress Notes (Signed)
Notified Dr Ether Griffins of pt low urine output from foley (100 ml); bladder scan revealed 113 ml retained; Dr acknowledged, ordered to resume IV fluids, NS @ 50 ml/hr

## 2015-06-10 NOTE — Progress Notes (Signed)
Notified Dr Ether Griffins to enquire about order to remove rectal tube; notifed Dr that since order was received, pt had put out approx 200 ml stool into rectal tube; asked Dr if she still wanted tube d/c; Dr ordered to keep tube in place and not d/c

## 2015-06-10 NOTE — Progress Notes (Signed)
ANTIBIOTIC CONSULT NOTE - INITIAL  Pharmacy Consult for cefazolin dosing Indication: bactermia  Allergies  Allergen Reactions  . Sulfa Antibiotics Anaphylaxis  . Penicillins Rash and Other (See Comments)    Has patient had a PCN reaction causing immediate rash, facial/tongue/throat swelling, SOB or lightheadedness with hypotension: No Has patient had a PCN reaction causing severe rash involving mucus membranes or skin necrosis: No Has patient had a PCN reaction that required hospitalization No Has patient had a PCN reaction occurring within the last 10 years: Yes If all of the above answers are "NO", then may proceed with Cephalosporin use.    Patient Measurements: Height: 5\' 1"  (154.9 cm) Weight: (!) 309 lb 9.6 oz (140.434 kg) IBW/kg (Calculated) : 47.8  Vital Signs: Temp: 98.2 F (36.8 C) (11/23 0516) Temp Source: Oral (11/23 0516) BP: 146/59 mmHg (11/23 0953) Pulse Rate: 64 (11/23 0953)  Labs:  Recent Labs  06/08/15 0504 06/09/15 0422 06/09/15 0719 06/10/15 0502 06/10/15 0740  WBC 17.5*  --  15.6*  --  15.8*  HGB 9.4*  --  8.8*  --  9.7*  PLT 139*  --  130*  --  118*  CREATININE  --  1.14*  --  1.80*  --    Estimated Creatinine Clearance: 42.3 mL/min (by C-G formula based on Cr of 1.8). No results for input(s): VANCOTROUGH, VANCOPEAK, VANCORANDOM, GENTTROUGH, GENTPEAK, GENTRANDOM, TOBRATROUGH, TOBRAPEAK, TOBRARND, AMIKACINPEAK, AMIKACINTROU, AMIKACIN in the last 72 hours.   Microbiology: Recent Results (from the past 720 hour(s))  Urine culture     Status: None   Collection Time: 06/02/15  6:38 PM  Result Value Ref Range Status   Specimen Description URINE, RANDOM  Final   Special Requests NONE  Final   Culture 30,000 COLONIES/mL STAPHYLOCOCCUS AUREUS  Final   Report Status 06/05/2015 FINAL  Final   Organism ID, Bacteria STAPHYLOCOCCUS AUREUS  Final      Susceptibility   Staphylococcus aureus - MIC*    CIPROFLOXACIN >=8 RESISTANT Resistant     ERYTHROMYCIN  4 RESISTANT Resistant     GENTAMICIN <=0.5 SENSITIVE Sensitive     OXACILLIN 1 SENSITIVE Sensitive     TRIMETH/SULFA <=10 SENSITIVE Sensitive     CLINDAMYCIN <=0.25 RESISTANT Resistant     CEFOXITIN SCREEN NEGATIVE Sensitive     Inducible Clindamycin Value in next row Resistant      POSITIVEINDUCIBLE CLINDAMYCIN RESISTANCE - A positive ICR test is indicative of inducible resistance to macrolides, lincosamides, and type B streptogramin.  This isolate is presumed to be resistant to Clindamycin, however, Clindamycin may still be effective in some patients.     TETRACYCLINE Value in next row Sensitive      SENSITIVE<=1    * 30,000 COLONIES/mL STAPHYLOCOCCUS AUREUS  Blood culture (routine x 2)     Status: None   Collection Time: 06/02/15  6:39 PM  Result Value Ref Range Status   Specimen Description BLOOD RIGHT ARM  Final   Special Requests BOTTLES DRAWN AEROBIC AND ANAEROBIC 4CC  Final   Culture  Setup Time   Final    GRAM POSITIVE COCCI IN CLUSTERS IN BOTH AEROBIC AND ANAEROBIC BOTTLES CRITICAL RESULT CALLED TO, READ BACK BY AND VERIFIED WITH: DONEESHA ROBERTSON [id number] ON 06/03/15 CTJ    Culture   Final    STAPHYLOCOCCUS AUREUS IN BOTH AEROBIC AND ANAEROBIC BOTTLES    Report Status 06/05/2015 FINAL  Final   Organism ID, Bacteria STAPHYLOCOCCUS AUREUS  Final      Susceptibility  Staphylococcus aureus - MIC*    CIPROFLOXACIN >=8 RESISTANT Resistant     ERYTHROMYCIN 4 RESISTANT Resistant     GENTAMICIN <=0.5 SENSITIVE Sensitive     OXACILLIN <=0.25 SENSITIVE Sensitive     TRIMETH/SULFA <=10 SENSITIVE Sensitive     CLINDAMYCIN <=0.25 RESISTANT Resistant     CEFOXITIN SCREEN NEGATIVE Sensitive     Inducible Clindamycin Value in next row Resistant      POSITIVEINDUCIBLE CLINDAMYCIN RESISTANCE - A positive ICR test is indicative of inducible resistance to macrolides, lincosamides, and type B streptogramin.  This isolate is presumed to be resistant to Clindamycin, however, Clindamycin may  still be effective in some patients.     TETRACYCLINE Value in next row Sensitive      SENSITIVE<=1    * STAPHYLOCOCCUS AUREUS  Blood culture (routine x 2)     Status: None   Collection Time: 06/02/15  6:39 PM  Result Value Ref Range Status   Specimen Description BLOOD RIGHT ASSIST CONTROL  Final   Special Requests BOTTLES DRAWN AEROBIC AND ANAEROBIC 4CC  Final   Culture  Setup Time   Final    GRAM POSITIVE COCCI IN CLUSTERS IN BOTH AEROBIC AND ANAEROBIC BOTTLES CRITICAL VALUE NOTED.  VALUE IS CONSISTENT WITH PREVIOUSLY REPORTED AND CALLED VALUE.    Culture   Final    STAPHYLOCOCCUS AUREUS IN BOTH AEROBIC AND ANAEROBIC BOTTLES    Report Status 06/05/2015 FINAL  Final   Organism ID, Bacteria STAPHYLOCOCCUS AUREUS  Final      Susceptibility   Staphylococcus aureus - MIC*    CIPROFLOXACIN >=8 RESISTANT Resistant     ERYTHROMYCIN 4 RESISTANT Resistant     GENTAMICIN <=0.5 SENSITIVE Sensitive     OXACILLIN <=0.25 SENSITIVE Sensitive     TRIMETH/SULFA <=10 SENSITIVE Sensitive     CLINDAMYCIN <=0.25 RESISTANT Resistant     CEFOXITIN SCREEN NEGATIVE Sensitive     Inducible Clindamycin Value in next row Resistant      POSITIVEINDUCIBLE CLINDAMYCIN RESISTANCE - A positive ICR test is indicative of inducible resistance to macrolides, lincosamides, and type B streptogramin.  This isolate is presumed to be resistant to Clindamycin, however, Clindamycin may still be effective in some patients.     TETRACYCLINE Value in next row Sensitive      SENSITIVE<=1    * STAPHYLOCOCCUS AUREUS  Culture, blood (routine x 2)     Status: None   Collection Time: 06/03/15  3:34 PM  Result Value Ref Range Status   Specimen Description BLOOD LEFT ARM  Final   Special Requests BOTTLES DRAWN AEROBIC AND ANAEROBIC 4CC  Final   Culture  Setup Time   Final    GRAM POSITIVE COCCI ANAEROBIC BOTTLE ONLY CRITICAL RESULT CALLED TO, READ BACK BY AND VERIFIED WITH: RN ANABELLA ROBINSON 06/08/15 0800    Culture  STAPHYLOCOCCUS AUREUS ANAEROBIC BOTTLE ONLY   Final   Report Status 06/10/2015 FINAL  Final   Organism ID, Bacteria STAPHYLOCOCCUS AUREUS  Final      Susceptibility   Staphylococcus aureus - MIC*    CIPROFLOXACIN >=8 RESISTANT Resistant     ERYTHROMYCIN 4 RESISTANT Resistant     GENTAMICIN 1 SENSITIVE Sensitive     OXACILLIN <=0.25 SENSITIVE Sensitive     TRIMETH/SULFA <=10 SENSITIVE Sensitive     CLINDAMYCIN <=0.25 RESISTANT Resistant     CEFOXITIN SCREEN NEGATIVE Sensitive     Inducible Clindamycin Value in next row Resistant      POSITIVEINDUCIBLE CLINDAMYCIN RESISTANCE - A positive  ICR test is indicative of inducible resistance to macrolides, lincosamides, and type B streptogramin.  This isolate is presumed to be resistant to Clindamycin, however, Clindamycin may still be effective in some patients.     TETRACYCLINE Value in next row Sensitive      SENSITIVE<=1    * STAPHYLOCOCCUS AUREUS  Culture, blood (routine x 2)     Status: None   Collection Time: 06/03/15  3:46 PM  Result Value Ref Range Status   Specimen Description BLOOD LEFT ARM  Final   Special Requests BOTTLES DRAWN AEROBIC AND ANAEROBIC 4CC  Final   Culture NO GROWTH 7 DAYS  Final   Report Status 06/10/2015 FINAL  Final  Culture, blood (routine x 2)     Status: None   Collection Time: 06/06/15 11:05 AM  Result Value Ref Range Status   Specimen Description BLOOD RIGHT PICC  Final   Special Requests   Final    BOTTLES DRAWN AEROBIC AND ANAEROBIC ANAEROBIC 3ML AEROBIC 6ML   Culture  Setup Time   Final    GRAM POSITIVE COCCI IN CLUSTERS IN BOTH AEROBIC AND ANAEROBIC BOTTLES CRITICAL RESULT CALLED TO, READ BACK BY AND VERIFIED WITH: EMILY HARRON AT [id number] ON 06/07/15 CTJ    Culture   Final    STAPHYLOCOCCUS AUREUS IN BOTH AEROBIC AND ANAEROBIC BOTTLES    Report Status 06/09/2015 FINAL  Final   Organism ID, Bacteria STAPHYLOCOCCUS AUREUS  Final      Susceptibility   Staphylococcus aureus - MIC*    CIPROFLOXACIN  >=8 RESISTANT Resistant     ERYTHROMYCIN 4 RESISTANT Resistant     GENTAMICIN <=0.5 SENSITIVE Sensitive     OXACILLIN <=0.25 SENSITIVE Sensitive     TRIMETH/SULFA <=10 SENSITIVE Sensitive     CLINDAMYCIN <=0.25 RESISTANT Resistant     CEFOXITIN SCREEN NEGATIVE Sensitive     Inducible Clindamycin Value in next row Resistant      POSITIVEINDUCIBLE CLINDAMYCIN RESISTANCE - A positive ICR test is indicative of inducible resistance to macrolides, lincosamides, and type B streptogramin.  This isolate is presumed to be resistant to Clindamycin, however, Clindamycin may still be effective in some patients.     TETRACYCLINE Value in next row Sensitive      SENSITIVE<=1    * STAPHYLOCOCCUS AUREUS  Urine culture     Status: None   Collection Time: 06/06/15 11:05 AM  Result Value Ref Range Status   Specimen Description URINE, RANDOM  Final   Special Requests NONE  Final   Culture NO GROWTH 2 DAYS  Final   Report Status 06/08/2015 FINAL  Final  Culture, blood (routine x 2)     Status: None   Collection Time: 06/06/15 11:48 AM  Result Value Ref Range Status   Specimen Description BLOOD LEFT AC  Final   Special Requests   Final    BOTTLES DRAWN AEROBIC AND ANAEROBIC  AER 3CC ANA 1CC   Culture  Setup Time   Final    GRAM POSITIVE COCCI IN CLUSTERS IN BOTH AEROBIC AND ANAEROBIC BOTTLES CRITICAL RESULT CALLED TO, READ BACK BY AND VERIFIED WITH: EMILY HARRON AT [id number] ON 06/07/15 CTJ    Culture   Final    STAPHYLOCOCCUS AUREUS IN BOTH AEROBIC AND ANAEROBIC BOTTLES    Report Status 06/09/2015 FINAL  Final   Organism ID, Bacteria STAPHYLOCOCCUS AUREUS  Final      Susceptibility   Staphylococcus aureus - MIC*    CIPROFLOXACIN >=8 RESISTANT Resistant  ERYTHROMYCIN 4 RESISTANT Resistant     GENTAMICIN <=0.5 SENSITIVE Sensitive     OXACILLIN <=0.25 SENSITIVE Sensitive     TRIMETH/SULFA <=10 SENSITIVE Sensitive     CLINDAMYCIN <=0.25 RESISTANT Resistant     CEFOXITIN SCREEN NEGATIVE Sensitive      Inducible Clindamycin Value in next row Resistant      POSITIVEINDUCIBLE CLINDAMYCIN RESISTANCE - A positive ICR test is indicative of inducible resistance to macrolides, lincosamides, and type B streptogramin.  This isolate is presumed to be resistant to Clindamycin, however, Clindamycin may still be effective in some patients.     TETRACYCLINE Value in next row Sensitive      SENSITIVE<=1    * STAPHYLOCOCCUS AUREUS  C difficile quick scan w PCR reflex     Status: None   Collection Time: 06/06/15 11:48 AM  Result Value Ref Range Status   C Diff antigen NEGATIVE NEGATIVE Final   C Diff toxin NEGATIVE NEGATIVE Final   C Diff interpretation Negative for C. difficile  Final  Culture, blood (routine x 2)     Status: None (Preliminary result)   Collection Time: 06/07/15 12:08 PM  Result Value Ref Range Status   Specimen Description BLOOD LEFT WRIST  Final   Special Requests BOTTLES DRAWN AEROBIC AND ANAEROBIC  1CC  Final   Culture NO GROWTH 3 DAYS  Final   Report Status PENDING  Incomplete  Culture, blood (routine x 2)     Status: None (Preliminary result)   Collection Time: 06/07/15 12:08 PM  Result Value Ref Range Status   Specimen Description BLOOD PICC LINE  Final   Special Requests   Final    BOTTLES DRAWN AEROBIC AND ANAEROBIC  AER 15CC ANA [place]   Culture NO GROWTH 3 DAYS  Final   Report Status PENDING  Incomplete   Assessment: Pharmacy consulted to dose cefazolin for MSSA bacteremia in this 64 year old female recently discharged on cefazolin for the same. Now readmitted with staph bacteremia (sens pending). Started vancomycin on admission x 3 days. Changing back to cefazolin per ID - suspect previous PICC line placed prior to cultures clearing.  Renal function worsening, Scr 0.4 >> 1.8 today, CrCl: 42  Plan:  Renal function worse, does not require dose reduction at this time. Will continue to follow. Will continue current orders for cefazolin 2gm IV Q8H.  Per ID planning for  treatment x 6 weeks.   Pharmacy to follow per consult  Rexene Edison, PharmD Clinical Pharmacist 06/10/2015 10:01 AM

## 2015-06-10 NOTE — Progress Notes (Signed)
Infectious Disease Long Term IV Antibiotic Orders  Diagnosis: MSSA bacteremia, Cellulitis and wound of thigh. TEE negative. BAck pain but too large for MRI machine  Culture results MSSA  Allergies:  Allergies  Allergen Reactions  . Sulfa Antibiotics Anaphylaxis  . Penicillins Rash and Other (See Comments)    Has patient had a PCN reaction causing immediate rash, facial/tongue/throat swelling, SOB or lightheadedness with hypotension: No Has patient had a PCN reaction causing severe rash involving mucus membranes or skin necrosis: No Has patient had a PCN reaction that required hospitalization No Has patient had a PCN reaction occurring within the last 10 years: Yes If all of the above answers are "NO", then may proceed with Cephalosporin use.    Discharge antibiotics  Cefazolin      2 grams every  8 hours  PICC Care per protocol Labs weekly while on IV antibiotics     FAX weekly labs to [phone] CBC w diff   Comprehensive met panel  Planned duration of antibiotics 6 weeks from 11/20  Stop date 07/19/14  Follow up clinic date TBD - within 3 weeks of dc FAX weekly labs to [phone]  Leonel Ramsay, MD

## 2015-06-10 NOTE — Plan of Care (Signed)
Problem: Education: Goal: Knowledge of [facility] Education information/materials will improve Outcome: Not Progressing Confused at times     Problem: Bowel/Gastric: Goal: Will not experience complications related to bowel motility Outcome: Not Progressing Rectal tube in place

## 2015-06-10 NOTE — Progress Notes (Signed)
[place] [facility] [place] NAME: Sharon Kirby    MR#:  [id number]  DATE OF BIRTH:  06-Oct-1950  SUBJECTIVE:  CHIEF COMPLAINT:   Chief Complaint  Patient presents with  . Altered Mental Status  . Tachycardia   Comfortable. Denies any significant pain at present, little diarrhea. However, results to be still in as well as Foley catheter. Minimal movement in bed or getting up. Morbidly obese. Blood cultures are negative as of 20th of November 2016. Blood cultures were positive for meth methicillin sensitive Savoca's orders, now on Ancef, PICC line removed 06/08/2015. Suspected bacterial endocarditis.   PICC line removed REVIEW OF SYSTEMS:  Review of Systems  Constitutional: Negative for fever, weight loss, malaise/fatigue and diaphoresis.  HENT: Negative for ear discharge, ear pain, hearing loss, nosebleeds, sore throat and tinnitus.   Eyes: Negative for blurred vision and pain.  Respiratory: Negative for cough, hemoptysis, shortness of breath and wheezing.   Cardiovascular: Negative for chest pain, palpitations, orthopnea and leg swelling.  Gastrointestinal: Negative for heartburn, nausea, vomiting, abdominal pain, diarrhea, constipation and blood in stool.  Genitourinary: Negative for dysuria, urgency and frequency.  Musculoskeletal: Positive for myalgias, back pain, joint pain and neck pain.  Skin: Negative for itching and rash.  Neurological: Negative for dizziness, tingling, tremors, focal weakness, seizures, weakness and headaches.  Psychiatric/Behavioral: Negative for depression. The patient is not nervous/anxious.    DRUG ALLERGIES:   Allergies  Allergen Reactions  . Sulfa Antibiotics Anaphylaxis  . Penicillins Rash and Other (See Comments)    Has patient had a PCN reaction causing immediate rash, facial/tongue/throat swelling, SOB or lightheadedness with hypotension: No Has patient had a PCN reaction causing severe rash  involving mucus membranes or skin necrosis: No Has patient had a PCN reaction that required hospitalization No Has patient had a PCN reaction occurring within the last 10 years: Yes If all of the above answers are "NO", then may proceed with Cephalosporin use.   VITALS:  Blood pressure 127/55, pulse 78, temperature 98 F (36.7 C), temperature source Oral, resp. rate 18, height 5\' 1"  (1.549 m), weight 140.434 kg (309 lb 9.6 oz), SpO2 96 %. PHYSICAL EXAMINATION:  Physical Exam   GENERAL:  64 y.o.-year-old patient lying in the bed , comfortable. Morbidly obese. EYES: Pupils equal, round, reactive to light and accommodation. No scleral icterus. Extraocular muscles intact.  HEENT: Head atraumatic, normocephalic. Oropharynx and nasopharynx clear.  NECK:  Supple, no jugular venous distention. No thyroid enlargement, no tenderness.  LUNGS: Normal breath sounds bilaterally, no wheezing, rales, rhonchi. No use of accessory muscles of respiration.  CARDIOVASCULAR: S1, S2 normal. No murmurs, rubs, or gallops.  ABDOMEN: Soft, nontender, nondistended. Bowel sounds present. No organomegaly or mass. Rectal tube, Foley catheter EXTREMITIES: No cyanosis, clubbing or edema b/l.    NEUROLOGIC: Cranial nerves II through XII are intact. No focal Motor or sensory deficits b/l.   PSYCHIATRIC: The patient is drowzy, confused. SKIN: No obvious rash, lesion, or ulcer.    LABORATORY PANEL:   CBC  Recent Labs Lab 06/10/15 0740  WBC 15.8*  HGB 9.7*  HCT 29.2*  PLT 118*   ------------------------------------------------------------------------------------------------------------------ Chemistries   Recent Labs Lab 06/09/15 0422 06/10/15 0502  NA 134*  --   K 4.2  --   CL 109  --   CO2 18*  --   GLUCOSE 144*  --   BUN 32*  --   CREATININE 1.14* 1.80*  CALCIUM 8.4*  --  AST 79*  --   ALT 57*  --   ALKPHOS 378*  --   BILITOT 1.1  --    RADIOLOGY:  No results found. ASSESSMENT AND PLAN:  64  y.o. female with a known history of diabetic mellitus, hypertension, anxiety and depression and multiple other medical problems is brought into the ED due to fever, confusion. Recent admission for MSSA bacteremia  * MSSA bacteremia Recent MSSA bacteremia on Cefazolin thru PICC. Discharged on 11/18. Bcx again growing MSSA. On cefazolin TEE showed no endocarditis. MRI lumbar could not be done due to her obesity. CT lumbar with contrast showed no abcess/infection. Repeat Bcx from 11/20 negative for 3 days. Place PICC line tomorrow.  * Cirrhosis of Liver with hepatic encephalopathy Dc lactulose, start xifaxan, discontinue rectal tube if the diarrhea subsides, hopefully tomorrow  * Chronic pain syndrome, no change in pain medication doses  * Diabetes mellitus Continue sliding scale insulin  * Essential hypertension Continue home medication list no pedal and titrate as needed basis, well-controlled at present  * DVT prophylaxis with Heprain SQ  * History of depression and anxiety Continue home medications Klonopin  *Generalized weakness, initiate physical therapy All the records are reviewed and case discussed with Care Management/Social Worker. Management plans discussed with the patient, family and they are in agreement.  CODE STATUS: Full Code  TOTAL TIME TAKING CARE OF THIS PATIENT: 35 minutes.    Theodoro Grist M.D on 06/10/2015 at 3:52 PM  Between 7am to 6pm - Pager - [phone]  After 6pm go to [email or web address] - password [email or web address] [facility]  Office  [phone]  CC: Primary care physician; Madelyn Brunner, MD

## 2015-06-11 ENCOUNTER — Inpatient Hospital Stay: Payer: Commercial Managed Care - HMO

## 2015-06-11 LAB — GLUCOSE, CAPILLARY
GLUCOSE-CAPILLARY: 108 mg/dL — AB (ref 65–99)
GLUCOSE-CAPILLARY: 150 mg/dL — AB (ref 65–99)
Glucose-Capillary: 160 mg/dL — ABNORMAL HIGH (ref 65–99)
Glucose-Capillary: 152 mg/dL — ABNORMAL HIGH (ref 65–99)

## 2015-06-11 MED ORDER — SODIUM CHLORIDE 0.9 % IJ SOLN
10.0000 mL | Freq: Two times a day (BID) | INTRAMUSCULAR | Status: DC
  Administered 2015-06-11 – 2015-06-14 (×6): 10 mL

## 2015-06-11 MED ORDER — ALUM & MAG HYDROXIDE-SIMETH 200-200-20 MG/5ML PO SUSP
30.0000 mL | ORAL | Status: DC | PRN
  Administered 2015-06-11 – 2015-06-13 (×3): 30 mL via ORAL
  Filled 2015-06-11 (×3): qty 30

## 2015-06-11 NOTE — Progress Notes (Signed)
[facility] [place] NAME: Sharon Kirby    MR#:  [id number]  DATE OF BIRTH:  Mar 21, 1951  SUBJECTIVE:  CHIEF COMPLAINT:   Chief Complaint  Patient presents with  . Altered Mental Status  . Tachycardia   Comfortable. Denies any significant pain at present, diarrhea , still significant. Has rectal tube as well as Foley catheter due to minimal movement in bed or getting up. Morbidly obese. Blood cultures are negative as of 20th of November 2016. Blood cultures were positive for meth methicillin sensitive Staphylococcus aureus , now on Ancef, PICC line removed 06/08/2015. Suspected bacterial endocarditis. PICC line to be inserted today 24th of November 2016 . Patient was noted to have poor urinary output and has a elevated creatinine today , IV fluids to be advanced   PICC line removed REVIEW OF SYSTEMS:  Review of Systems  Constitutional: Negative for fever, weight loss, malaise/fatigue and diaphoresis.  HENT: Negative for ear discharge, ear pain, hearing loss, nosebleeds, sore throat and tinnitus.   Eyes: Negative for blurred vision and pain.  Respiratory: Negative for cough, hemoptysis, shortness of breath and wheezing.   Cardiovascular: Negative for chest pain, palpitations, orthopnea and leg swelling.  Gastrointestinal: Negative for heartburn, nausea, vomiting, abdominal pain, diarrhea, constipation and blood in stool.  Genitourinary: Negative for dysuria, urgency and frequency.  Musculoskeletal: Positive for myalgias, back pain, joint pain and neck pain.  Skin: Negative for itching and rash.  Neurological: Negative for dizziness, tingling, tremors, focal weakness, seizures, weakness and headaches.  Psychiatric/Behavioral: Negative for depression. The patient is not nervous/anxious.    DRUG ALLERGIES:   Allergies  Allergen Reactions  . Sulfa Antibiotics Anaphylaxis  . Penicillins Rash and Other (See Comments)    Has patient had  a PCN reaction causing immediate rash, facial/tongue/throat swelling, SOB or lightheadedness with hypotension: No Has patient had a PCN reaction causing severe rash involving mucus membranes or skin necrosis: No Has patient had a PCN reaction that required hospitalization No Has patient had a PCN reaction occurring within the last 10 years: Yes If all of the above answers are "NO", then may proceed with Cephalosporin use.   VITALS:  Blood pressure 137/99, pulse 76, temperature 98 F (36.7 C), temperature source Oral, resp. rate 19, height 5\' 1"  (1.549 m), weight 140.434 kg (309 lb 9.6 oz), SpO2 98 %. PHYSICAL EXAMINATION:  Physical Exam   GENERAL:  64 y.o.-year-old patient lying in the bed , comfortable. Morbidly obese. weak and uncomfortable  EYES: Pupils equal, round, reactive to light and accommodation. No scleral icterus. Extraocular muscles intact.  HEENT: Head atraumatic, normocephalic. Oropharynx and nasopharynx clear.  NECK:  Supple, no jugular venous distention. No thyroid enlargement, no tenderness.  LUNGS: Normal breath sounds bilaterally, no wheezing, rales, rhonchi. No use of accessory muscles of respiration.  CARDIOVASCULAR: S1, S2 normal. No murmurs, rubs, or gallops.  ABDOMEN: Soft, nontender, nondistended. Bowel sounds present. No organomegaly or mass. Rectal tube, containing liquidy stool, Foley catheter is minimal amount of urine  EXTREMITIES: No cyanosis, clubbing or edema b/l.    NEUROLOGIC: Cranial nerves II through XII are intact. No focal Motor or sensory deficits b/l.   PSYCHIATRIC: The patient is alert, oriented 3 . SKIN: No obvious rash, lesion, or ulcer.    LABORATORY PANEL:   CBC  Recent Labs Lab 06/10/15 0740  WBC 15.8*  HGB 9.7*  HCT 29.2*  PLT 118*   ------------------------------------------------------------------------------------------------------------------ Chemistries   Recent Labs Lab 06/09/15  0422  06/10/15 1546  NA 134*  --  138   K 4.2  --  5.2*  CL 109  --  112*  CO2 18*  --  16*  GLUCOSE 144*  --  184*  BUN 32*  --  51*  CREATININE 1.14*  < > 2.05*  CALCIUM 8.4*  --  8.8*  AST 79*  --   --   ALT 57*  --   --   ALKPHOS 378*  --   --   BILITOT 1.1  --   --   < > = values in this interval not displayed. RADIOLOGY:  No results found. ASSESSMENT AND PLAN:  64 y.o. female with a known history of diabetic mellitus, hypertension, anxiety and depression and multiple other medical problems is brought into the ED due to fever, confusion. Recent admission for MSSA bacteremia  * MSSA bacteremia, Recent MSSA bacteremia on Cefazolin thru PICC. Discharged on 11/18. Bcx again growing MSSA.  back on cefazolin . Repeated blood cultures 11/20 are negative. Place PICC line today   TEE showed no endocarditis. MRI lumbar could not be done due to her obesity. CT lumbar with contrast showed no abcess/infection. . 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    *DVT prophylaxis with Heprain SQ  * History of depression and anxiety Continue home medications Klonopin, relatively well controlled   *Generaliweakness, continue physical therapy, may need to skilled nursing facility placement All the records are reviewed and case discussed with Care Management/Social Worker. Management plans discussed with the patient, family and they are in agreement.  CODE STATUS: Full Code  TOTAL TIME TAKING CARE OF THIS PATIENT: 35 minutes.    Theodoro Grist M.D on 06/11/2015 at 12:53  PM  Between 7am to 6pm - Pager - [phone]  After 6pm go to [email or web address] - password [email or web address] [facility]  Office  [phone]  CC: Primary care physician; Madelyn Brunner, MD

## 2015-06-11 NOTE — Plan of Care (Signed)
Problem: Education: Goal: Knowledge of  General Education information/materials will improve Outcome: Not Progressing Confused at times     Problem: Pain Managment: Goal: General experience of comfort will improve Outcome: Not Progressing Chronic pain syndrome     Problem: Activity: Goal: Risk for activity intolerance will decrease Outcome: Not Progressing Pt unable to get OOB at this time     Problem: Bowel/Gastric: Goal: Will not experience complications related to bowel motility Outcome: Not Progressing Rectal tube in place

## 2015-06-11 NOTE — Plan of Care (Signed)
Problem: Education: Goal: Knowledge of [place] [facility] Education information/materials will improve Outcome: Not Progressing Pt confused     Problem: Health Behavior/Discharge Planning: Goal: Ability to manage health-related needs will improve Outcome: Not Progressing Pt confused

## 2015-06-12 LAB — BASIC METABOLIC PANEL
ANION GAP: 3 — AB (ref 5–15)
BUN: 53 mg/dL — AB (ref 6–20)
CALCIUM: 6.7 mg/dL — AB (ref 8.9–10.3)
CO2: 17 mmol/L — AB (ref 22–32)
Chloride: 115 mmol/L — ABNORMAL HIGH (ref 101–111)
Creatinine, Ser: 1.46 mg/dL — ABNORMAL HIGH (ref 0.44–1.00)
GFR calc Af Amer: 43 mL/min — ABNORMAL LOW (ref >60)
GFR, EST NON AFRICAN AMERICAN: 37 mL/min — AB (ref >60)
GLUCOSE: 135 mg/dL — AB (ref 65–99)
Potassium: 3.2 mmol/L — ABNORMAL LOW (ref 3.5–5.1)
Sodium: 135 mmol/L (ref 135–145)

## 2015-06-12 LAB — GLUCOSE, CAPILLARY
GLUCOSE-CAPILLARY: 133 mg/dL — AB (ref 65–99)
GLUCOSE-CAPILLARY: 138 mg/dL — AB (ref 65–99)
Glucose-Capillary: 135 mg/dL — ABNORMAL HIGH (ref 65–99)
Glucose-Capillary: 149 mg/dL — ABNORMAL HIGH (ref 65–99)

## 2015-06-12 LAB — CBC
HCT: 24.9 % — ABNORMAL LOW (ref 35.0–47.0)
HEMOGLOBIN: 8.3 g/dL — AB (ref 12.0–16.0)
MCH: 29.6 pg (ref 26.0–34.0)
MCHC: 33.1 g/dL (ref 32.0–36.0)
MCV: 89.5 fL (ref 80.0–100.0)
Platelets: 139 10*3/uL — ABNORMAL LOW (ref 150–440)
RBC: 2.79 MIL/uL — ABNORMAL LOW (ref 3.80–5.20)
RDW: 23.1 % — AB (ref 11.5–14.5)
WBC: 10.7 10*3/uL (ref 3.6–11.0)

## 2015-06-12 LAB — CULTURE, BLOOD (ROUTINE X 2)
CULTURE: NO GROWTH
CULTURE: NO GROWTH

## 2015-06-12 LAB — MAGNESIUM: MAGNESIUM: 1.8 mg/dL (ref 1.7–2.4)

## 2015-06-12 MED ORDER — LOPERAMIDE HCL 2 MG PO CAPS
2.0000 mg | ORAL_CAPSULE | ORAL | Status: DC | PRN
  Filled 2015-06-12: qty 1

## 2015-06-12 MED ORDER — POTASSIUM CHLORIDE IN NACL 20-0.9 MEQ/L-% IV SOLN
INTRAVENOUS | Status: DC
  Administered 2015-06-12 – 2015-06-13 (×3): via INTRAVENOUS
  Filled 2015-06-12 (×8): qty 1000

## 2015-06-12 NOTE — Progress Notes (Signed)
ANTIBIOTIC CONSULT NOTE - INITIAL  Pharmacy Consult for cefazolin dosing Indication: bactermia  Allergies  Allergen Reactions  . Sulfa Antibiotics Anaphylaxis  . Penicillins Rash and Other (See Comments)    Has patient had a PCN reaction causing immediate rash, facial/tongue/throat swelling, SOB or lightheadedness with hypotension: No Has patient had a PCN reaction causing severe rash involving mucus membranes or skin necrosis: No Has patient had a PCN reaction that required hospitalization No Has patient had a PCN reaction occurring within the last 10 years: Yes If all of the above answers are "NO", then may proceed with Cephalosporin use.    Patient Measurements: Height: 5\' 1"  (154.9 cm) Weight: (!) 309 lb 9.6 oz (140.434 kg) IBW/kg (Calculated) : 47.8  Vital Signs: Temp: 98.1 F (36.7 C) (11/25 0447) Temp Source: Oral (11/25 0447) BP: 132/72 mmHg (11/25 0447) Pulse Rate: 73 (11/25 0447)  Labs:  Recent Labs  06/10/15 0502 06/10/15 0740 06/10/15 1546 06/12/15 0519  WBC  --  15.8*  --  10.7  HGB  --  9.7*  --  8.3*  PLT  --  118*  --  139*  CREATININE 1.80*  --  2.05* 1.46*   Estimated Creatinine Clearance: 52.1 mL/min (by C-G formula based on Cr of 1.46). No results for input(s): VANCOTROUGH, VANCOPEAK, VANCORANDOM, GENTTROUGH, GENTPEAK, GENTRANDOM, TOBRATROUGH, TOBRAPEAK, TOBRARND, AMIKACINPEAK, AMIKACINTROU, AMIKACIN in the last 72 hours.   Microbiology: Recent Results (from the past 720 hour(s))  Urine culture     Status: None   Collection Time: 06/02/15  6:38 PM  Result Value Ref Range Status   Specimen Description URINE, RANDOM  Final   Special Requests NONE  Final   Culture 30,000 COLONIES/mL STAPHYLOCOCCUS AUREUS  Final   Report Status 06/05/2015 FINAL  Final   Organism ID, Bacteria STAPHYLOCOCCUS AUREUS  Final      Susceptibility   Staphylococcus aureus - MIC*    CIPROFLOXACIN >=8 RESISTANT Resistant     ERYTHROMYCIN 4 RESISTANT Resistant    GENTAMICIN <=0.5 SENSITIVE Sensitive     OXACILLIN 1 SENSITIVE Sensitive     TRIMETH/SULFA <=10 SENSITIVE Sensitive     CLINDAMYCIN <=0.25 RESISTANT Resistant     CEFOXITIN SCREEN NEGATIVE Sensitive     Inducible Clindamycin Value in next row Resistant      POSITIVEINDUCIBLE CLINDAMYCIN RESISTANCE - A positive ICR test is indicative of inducible resistance to macrolides, lincosamides, and type B streptogramin.  This isolate is presumed to be resistant to Clindamycin, however, Clindamycin may still be effective in some patients.     TETRACYCLINE Value in next row Sensitive      SENSITIVE<=1    * 30,000 COLONIES/mL STAPHYLOCOCCUS AUREUS  Blood culture (routine x 2)     Status: None   Collection Time: 06/02/15  6:39 PM  Result Value Ref Range Status   Specimen Description BLOOD RIGHT ARM  Final   Special Requests BOTTLES DRAWN AEROBIC AND ANAEROBIC 4CC  Final   Culture  Setup Time   Final    GRAM POSITIVE COCCI IN CLUSTERS IN BOTH AEROBIC AND ANAEROBIC BOTTLES CRITICAL RESULT CALLED TO, READ BACK BY AND VERIFIED WITH: DONEESHA ROBERTSON [id number] ON 06/03/15 CTJ    Culture   Final    STAPHYLOCOCCUS AUREUS IN BOTH AEROBIC AND ANAEROBIC BOTTLES    Report Status 06/05/2015 FINAL  Final   Organism ID, Bacteria STAPHYLOCOCCUS AUREUS  Final      Susceptibility   Staphylococcus aureus - MIC*    CIPROFLOXACIN >=8 RESISTANT Resistant  ERYTHROMYCIN 4 RESISTANT Resistant     GENTAMICIN <=0.5 SENSITIVE Sensitive     OXACILLIN <=0.25 SENSITIVE Sensitive     TRIMETH/SULFA <=10 SENSITIVE Sensitive     CLINDAMYCIN <=0.25 RESISTANT Resistant     CEFOXITIN SCREEN NEGATIVE Sensitive     Inducible Clindamycin Value in next row Resistant      POSITIVEINDUCIBLE CLINDAMYCIN RESISTANCE - A positive ICR test is indicative of inducible resistance to macrolides, lincosamides, and type B streptogramin.  This isolate is presumed to be resistant to Clindamycin, however, Clindamycin may still be effective in some  patients.     TETRACYCLINE Value in next row Sensitive      SENSITIVE<=1    * STAPHYLOCOCCUS AUREUS  Blood culture (routine x 2)     Status: None   Collection Time: 06/02/15  6:39 PM  Result Value Ref Range Status   Specimen Description BLOOD RIGHT ASSIST CONTROL  Final   Special Requests BOTTLES DRAWN AEROBIC AND ANAEROBIC 4CC  Final   Culture  Setup Time   Final    GRAM POSITIVE COCCI IN CLUSTERS IN BOTH AEROBIC AND ANAEROBIC BOTTLES CRITICAL VALUE NOTED.  VALUE IS CONSISTENT WITH PREVIOUSLY REPORTED AND CALLED VALUE.    Culture   Final    STAPHYLOCOCCUS AUREUS IN BOTH AEROBIC AND ANAEROBIC BOTTLES    Report Status 06/05/2015 FINAL  Final   Organism ID, Bacteria STAPHYLOCOCCUS AUREUS  Final      Susceptibility   Staphylococcus aureus - MIC*    CIPROFLOXACIN >=8 RESISTANT Resistant     ERYTHROMYCIN 4 RESISTANT Resistant     GENTAMICIN <=0.5 SENSITIVE Sensitive     OXACILLIN <=0.25 SENSITIVE Sensitive     TRIMETH/SULFA <=10 SENSITIVE Sensitive     CLINDAMYCIN <=0.25 RESISTANT Resistant     CEFOXITIN SCREEN NEGATIVE Sensitive     Inducible Clindamycin Value in next row Resistant      POSITIVEINDUCIBLE CLINDAMYCIN RESISTANCE - A positive ICR test is indicative of inducible resistance to macrolides, lincosamides, and type B streptogramin.  This isolate is presumed to be resistant to Clindamycin, however, Clindamycin may still be effective in some patients.     TETRACYCLINE Value in next row Sensitive      SENSITIVE<=1    * STAPHYLOCOCCUS AUREUS  Culture, blood (routine x 2)     Status: None   Collection Time: 06/03/15  3:34 PM  Result Value Ref Range Status   Specimen Description BLOOD LEFT ARM  Final   Special Requests BOTTLES DRAWN AEROBIC AND ANAEROBIC 4CC  Final   Culture  Setup Time   Final    GRAM POSITIVE COCCI ANAEROBIC BOTTLE ONLY CRITICAL RESULT CALLED TO, READ BACK BY AND VERIFIED WITH: RN ANABELLA ROBINSON 06/08/15 0800    Culture STAPHYLOCOCCUS AUREUS ANAEROBIC  BOTTLE ONLY   Final   Report Status 06/10/2015 FINAL  Final   Organism ID, Bacteria STAPHYLOCOCCUS AUREUS  Final      Susceptibility   Staphylococcus aureus - MIC*    CIPROFLOXACIN >=8 RESISTANT Resistant     ERYTHROMYCIN 4 RESISTANT Resistant     GENTAMICIN 1 SENSITIVE Sensitive     OXACILLIN <=0.25 SENSITIVE Sensitive     TRIMETH/SULFA <=10 SENSITIVE Sensitive     CLINDAMYCIN <=0.25 RESISTANT Resistant     CEFOXITIN SCREEN NEGATIVE Sensitive     Inducible Clindamycin Value in next row Resistant      POSITIVEINDUCIBLE CLINDAMYCIN RESISTANCE - A positive ICR test is indicative of inducible resistance to macrolides, lincosamides, and type B streptogramin.  This isolate is presumed to be resistant to Clindamycin, however, Clindamycin may still be effective in some patients.     TETRACYCLINE Value in next row Sensitive      SENSITIVE<=1    * STAPHYLOCOCCUS AUREUS  Culture, blood (routine x 2)     Status: None   Collection Time: 06/03/15  3:46 PM  Result Value Ref Range Status   Specimen Description BLOOD LEFT ARM  Final   Special Requests BOTTLES DRAWN AEROBIC AND ANAEROBIC 4CC  Final   Culture NO GROWTH 7 DAYS  Final   Report Status 06/10/2015 FINAL  Final  Culture, blood (routine x 2)     Status: None   Collection Time: 06/06/15 11:05 AM  Result Value Ref Range Status   Specimen Description BLOOD RIGHT PICC  Final   Special Requests   Final    BOTTLES DRAWN AEROBIC AND ANAEROBIC ANAEROBIC 3ML AEROBIC 6ML   Culture  Setup Time   Final    GRAM POSITIVE COCCI IN CLUSTERS IN BOTH AEROBIC AND ANAEROBIC BOTTLES CRITICAL RESULT CALLED TO, READ BACK BY AND VERIFIED WITH: EMILY HARRON AT [id number] ON 06/07/15 CTJ    Culture   Final    STAPHYLOCOCCUS AUREUS IN BOTH AEROBIC AND ANAEROBIC BOTTLES    Report Status 06/09/2015 FINAL  Final   Organism ID, Bacteria STAPHYLOCOCCUS AUREUS  Final      Susceptibility   Staphylococcus aureus - MIC*    CIPROFLOXACIN >=8 RESISTANT Resistant      ERYTHROMYCIN 4 RESISTANT Resistant     GENTAMICIN <=0.5 SENSITIVE Sensitive     OXACILLIN <=0.25 SENSITIVE Sensitive     TRIMETH/SULFA <=10 SENSITIVE Sensitive     CLINDAMYCIN <=0.25 RESISTANT Resistant     CEFOXITIN SCREEN NEGATIVE Sensitive     Inducible Clindamycin Value in next row Resistant      POSITIVEINDUCIBLE CLINDAMYCIN RESISTANCE - A positive ICR test is indicative of inducible resistance to macrolides, lincosamides, and type B streptogramin.  This isolate is presumed to be resistant to Clindamycin, however, Clindamycin may still be effective in some patients.     TETRACYCLINE Value in next row Sensitive      SENSITIVE<=1    * STAPHYLOCOCCUS AUREUS  Urine culture     Status: None   Collection Time: 06/06/15 11:05 AM  Result Value Ref Range Status   Specimen Description URINE, RANDOM  Final   Special Requests NONE  Final   Culture NO GROWTH 2 DAYS  Final   Report Status 06/08/2015 FINAL  Final  Culture, blood (routine x 2)     Status: None   Collection Time: 06/06/15 11:48 AM  Result Value Ref Range Status   Specimen Description BLOOD LEFT AC  Final   Special Requests   Final    BOTTLES DRAWN AEROBIC AND ANAEROBIC  AER 3CC ANA 1CC   Culture  Setup Time   Final    GRAM POSITIVE COCCI IN CLUSTERS IN BOTH AEROBIC AND ANAEROBIC BOTTLES CRITICAL RESULT CALLED TO, READ BACK BY AND VERIFIED WITH: EMILY HARRON AT [id number] ON 06/07/15 CTJ    Culture   Final    STAPHYLOCOCCUS AUREUS IN BOTH AEROBIC AND ANAEROBIC BOTTLES    Report Status 06/09/2015 FINAL  Final   Organism ID, Bacteria STAPHYLOCOCCUS AUREUS  Final      Susceptibility   Staphylococcus aureus - MIC*    CIPROFLOXACIN >=8 RESISTANT Resistant     ERYTHROMYCIN 4 RESISTANT Resistant     GENTAMICIN <=0.5 SENSITIVE Sensitive  OXACILLIN <=0.25 SENSITIVE Sensitive     TRIMETH/SULFA <=10 SENSITIVE Sensitive     CLINDAMYCIN <=0.25 RESISTANT Resistant     CEFOXITIN SCREEN NEGATIVE Sensitive     Inducible Clindamycin Value  in next row Resistant      POSITIVEINDUCIBLE CLINDAMYCIN RESISTANCE - A positive ICR test is indicative of inducible resistance to macrolides, lincosamides, and type B streptogramin.  This isolate is presumed to be resistant to Clindamycin, however, Clindamycin may still be effective in some patients.     TETRACYCLINE Value in next row Sensitive      SENSITIVE<=1    * STAPHYLOCOCCUS AUREUS  C difficile quick scan w PCR reflex     Status: None   Collection Time: 06/06/15 11:48 AM  Result Value Ref Range Status   C Diff antigen NEGATIVE NEGATIVE Final   C Diff toxin NEGATIVE NEGATIVE Final   C Diff interpretation Negative for C. difficile  Final  Culture, blood (routine x 2)     Status: None   Collection Time: 06/07/15 12:08 PM  Result Value Ref Range Status   Specimen Description BLOOD LEFT WRIST  Final   Special Requests BOTTLES DRAWN AEROBIC AND ANAEROBIC  1CC  Final   Culture NO GROWTH 5 DAYS  Final   Report Status 06/12/2015 FINAL  Final  Culture, blood (routine x 2)     Status: None   Collection Time: 06/07/15 12:08 PM  Result Value Ref Range Status   Specimen Description BLOOD PICC LINE  Final   Special Requests   Final    BOTTLES DRAWN AEROBIC AND ANAEROBIC  AER [place] ANA [place]   Culture NO GROWTH 5 DAYS  Final   Report Status 06/12/2015 FINAL  Final   Assessment: Pharmacy consulted to dose cefazolin for MSSA bacteremia in this 64 year old female.  Plan:  Will continue current orders for cefazolin 2gm IV Q8H.  Per ID planning for treatment x 6 weeks.   Pharmacy to follow per consult  Rexene Edison, PharmD Clinical Pharmacist 06/12/2015 10:28 AM

## 2015-06-12 NOTE — Clinical Social Work Note (Signed)
Sharon Kirby with [employer] [place] has called CSW with auth for STR: [id number] so that if patient needs to go to rehab over the weekend, she can go to [place]. Shela Leff MSW,LCSW [phone]

## 2015-06-12 NOTE — Progress Notes (Signed)
[place] [facility] [place] NAME: Sharon Kirby    MR#:  [id number]  DATE OF BIRTH:  04-Dec-1950  SUBJECTIVE:  CHIEF COMPLAINT:   Chief Complaint  Patient presents with  . Altered Mental Status  . Tachycardia   Comfortable. Denies any significant pain at present, diarrhea , still significant. Has rectal tube as well as Foley catheter due to minimal movement in bed or getting up. Morbidly obese. Blood cultures are negative as of 20th of November 2016. Blood cultures were positive for meth methicillin sensitive Staphylococcus aureus , now on Ancef, PICC line removed 06/08/2015. Suspected bacterial endocarditis. PICC line to be inserted today 24th of November 2016 . Patient was noted to have poor urinary output and had a elevated creatinine yesterday, IV fluids, where advanced with some improvement of creatinine as well as urinary output. Still has diarrheal stool. C. difficile negative  PICC line removed REVIEW OF SYSTEMS:  Review of Systems  Constitutional: Negative for fever, weight loss, malaise/fatigue and diaphoresis.  HENT: Negative for ear discharge, ear pain, hearing loss, nosebleeds, sore throat and tinnitus.   Eyes: Negative for blurred vision and pain.  Respiratory: Negative for cough, hemoptysis, shortness of breath and wheezing.   Cardiovascular: Negative for chest pain, palpitations, orthopnea and leg swelling.  Gastrointestinal: Negative for heartburn, nausea, vomiting, abdominal pain, diarrhea, constipation and blood in stool.  Genitourinary: Negative for dysuria, urgency and frequency.  Musculoskeletal: Positive for myalgias, back pain, joint pain and neck pain.  Skin: Negative for itching and rash.  Neurological: Negative for dizziness, tingling, tremors, focal weakness, seizures, weakness and headaches.  Psychiatric/Behavioral: Negative for depression. The patient is not nervous/anxious.    DRUG ALLERGIES:   Allergies   Allergen Reactions  . Sulfa Antibiotics Anaphylaxis  . Penicillins Rash and Other (See Comments)    Has patient had a PCN reaction causing immediate rash, facial/tongue/throat swelling, SOB or lightheadedness with hypotension: No Has patient had a PCN reaction causing severe rash involving mucus membranes or skin necrosis: No Has patient had a PCN reaction that required hospitalization No Has patient had a PCN reaction occurring within the last 10 years: Yes If all of the above answers are "NO", then may proceed with Cephalosporin use.   VITALS:  Blood pressure 135/73, pulse 72, temperature 98.5 F (36.9 C), temperature source Oral, resp. rate 17, height 5\' 1"  (1.549 m), weight 140.434 kg (309 lb 9.6 oz), SpO2 97 %. PHYSICAL EXAMINATION:  Physical Exam   GENERAL:  64 y.o.-year-old patient lying in the bed , comfortable. Morbidly obese. weak and uncomfortable  EYES: Pupils equal, round, reactive to light and accommodation. No scleral icterus. Extraocular muscles intact.  HEENT: Head atraumatic, normocephalic. Oropharynx and nasopharynx clear.  NECK:  Supple, no jugular venous distention. No thyroid enlargement, no tenderness.  LUNGS: Normal breath sounds bilaterally, no wheezing, rales, rhonchi. No use of accessory muscles of respiration.  CARDIOVASCULAR: S1, S2 normal. No murmurs, rubs, or gallops.  ABDOMEN: Soft, nontender, nondistended. Bowel sounds present. No organomegaly or mass. Rectal tube, containing liquidy stool, Foley catheter is small amount of urine  EXTREMITIES: No cyanosis, clubbing or edema b/l.    NEUROLOGIC: Cranial nerves II through XII are intact. No focal Motor or sensory deficits b/l.   PSYCHIATRIC: The patient is alert, oriented 3 . SKIN: No obvious rash, lesion, or ulcer.    LABORATORY PANEL:   CBC  Recent Labs Lab 06/12/15 0519  WBC 10.7  HGB 8.3*  HCT  24.9*  PLT 139*    ------------------------------------------------------------------------------------------------------------------ Chemistries   Recent Labs Lab 06/09/15 0422  06/12/15 0519  NA 134*  < > 135  K 4.2  < > 3.2*  CL 109  < > 115*  CO2 18*  < > 17*  GLUCOSE 144*  < > 135*  BUN 32*  < > 53*  CREATININE 1.14*  < > 1.46*  CALCIUM 8.4*  < > 6.7*  MG  --   --  1.8  AST 79*  --   --   ALT 57*  --   --   ALKPHOS 378*  --   --   BILITOT 1.1  --   --   < > = values in this interval not displayed. RADIOLOGY:  No results found. ASSESSMENT AND PLAN:  64 y.o. female with a known history of diabetic mellitus, hypertension, anxiety and depression and multiple other medical problems is brought into the ED due to fever, confusion. Recent admission for MSSA bacteremia  * MSSA bacteremia, Recent MSSA bacteremia on Cefazolin thru PICC. Discharged on 11/18. Bcx again growing MSSA.  back on cefazolin . Repeated blood cultures 11/20 are negative. Status post PICC line 24th of November TEE showed no endocarditis. MRI lumbar could not be done due to her obesity. CT lumbar with contrast showed no abcess/infection. . *Acute renal failure , better on advanced IV fluids , follow kidney function tomorrow , likely due to dehydration due to diarrhea   * Cirrhosis of Liver with hepatic encephalopathy off lactulose, now on xifaxan, discontinue rectal tube if the diarrhea subsides , add Imodium , following closely   * Chronic pain syndrome, no change in pain medication doses  * Diabetes mellitus Continue sliding scale insulin  * Essential hypertension, Now off ACE inhibitor, continue Lopressor, watching patient's blood pressure readings closely.    *DVT prophylaxis with Heprain SQ  * History of depression and anxiety Continue home medications Klonopin, relatively well controlled   *Generaliweakness, continue physical therapy, will need to go to skilled nursing facility , likely over weekend All the  records are reviewed and case discussed with Care Management/Social Worker. Management plans discussed with the patient, family and they are in agreement.  CODE STATUS: Full Code  TOTAL TIME TAKING CARE OF THIS PATIENT: 40 minutes.  Discussed with patient's son, coordination of care time 18 minutes  Annis Lagoy M.D on 06/12/2015 at 3:12 PM  Between 7am to 6pm - Pager - [phone]  After 6pm go to [email or web address] - password [email or web address]  Office  [phone]  CC: Primary care physician; Madelyn Brunner, MD

## 2015-06-12 NOTE — Care Management Important Message (Signed)
Important Message  Patient Details  Name: Sharon Kirby MRN: [id number] Date of Birth: 11/30/1950   Medicare Important Message Given:  Yes    Jolly Mango, RN 06/12/2015, 12:46 PM

## 2015-06-12 NOTE — Progress Notes (Signed)
Physical Therapy Treatment Patient Details Name: Sharon Kirby MRN: [id number] DOB: [birth year] Today's Date: 06/12/2015    History of Present Illness Pt presented from [facility] due to falls, low back pain, and confusion. She was admitted for sepsis, acute metabolic encephalopathy, and hyponatremia. She was recently at [facility] and discharged to SNF. She was at [facility] for less than 24 hours before returning. During prior hospital visit pt presented to ER after fall 3 days prior with significant R knee pain and inability to mobilize in home environment since.  R knee x-Sharon Kirby negative for acute injury; lumbar spine negative for acute injury, significant for 45mm anterolisthesis L5-S1. Attempted to perform evaluation late yesterday afternoon but she was covered in stool and staff was unable to immediately assist in cleaning patient. During evaluation today pt is AOx2 so history supplemented from medical record    PT Comments    Pt is able to participate in limited therapy tasks this date, however is severely deconditioned from prolonged bed rest and is unable to perform rolling in bed without heavy max assist. Limited bed level there-ex performed, however difficult to keep pt on task with multiple redirection cues required. Limited ROM in B UE shoulder affecting assist pt able to provide for future OOB tasks. Pillows propped under legs to elevate heels.   Follow Up Recommendations  SNF     Equipment Recommendations  None recommended by PT    Recommendations for Other Services       Precautions / Restrictions Precautions Precautions: Fall Restrictions Weight Bearing Restrictions: No    Mobility  Bed Mobility Overal bed mobility: +2 for physical assistance;Needs Assistance Bed Mobility: Rolling Rolling: Max assist         General bed mobility comments: 2 attempts for rolling made to R side. Pt struggles to use B UE for grasp on bed rails secondary to poor  shoulder mobility. Max assist from therapist to roll. Unable to completely roll secondary to weakness.  Transfers                 General transfer comment: unable at this time secondary to lack of staff assist and pt weakness  Ambulation/Gait             General Gait Details: Unable   Stairs            Wheelchair Mobility    Modified Rankin (Stroke Patients Only)       Balance                                    Cognition Arousal/Alertness: Awake/alert Behavior During Therapy: WFL for tasks assessed/performed Overall Cognitive Status: Within Functional Limits for tasks assessed                      Exercises Other Exercises Other Exercises: Pt performed limited ther-ex in bed with B LE including ankle pumps, SAQ, SLRs, and knee flexion. Pt with heavily external rotation on L LE limiting SLRs. All ther-ex performed x 12 reps with min/mod assist for completion. Pt also able to perform 5 reps of B UE shoulder flexion, however limited to shoulder level. Supervision given for correct technique.    General Comments        Pertinent Vitals/Pain Pain Assessment: No/denies pain    Home Living  Prior Function            PT Goals (current goals can now be found in the care plan section) Acute Rehab PT Goals Patient Stated Goal: to do leg exercises PT Goal Formulation: With patient Time For Goal Achievement: 06/16/15 Potential to Achieve Goals: Fair Progress towards PT goals: Progressing toward goals    Frequency  Min 2X/week    PT Plan Current plan remains appropriate    Co-evaluation             End of Session   Activity Tolerance: Patient limited by fatigue Patient left: in bed;with bed alarm set     Time: 0910-0928 PT Time Calculation (min) (ACUTE ONLY): 18 min  Charges:  $Therapeutic Exercise: 8-22 mins                    G Codes:      Sharon Kirby 07-03-2015, 9:51  AM  Sharon Kirby, PT, DPT [phone]

## 2015-06-12 NOTE — Plan of Care (Signed)
Problem: Education: Goal: Knowledge of [place] [facility] Education information/materials will improve Outcome: Not Progressing Confused at times     Problem: Health Behavior/Discharge Planning: Goal: Ability to manage health-related needs will improve Outcome: Not Progressing Confused at times     Problem: Pain Managment: Goal: General experience of comfort will improve Outcome: Not Progressing Chronic pain     Problem: Skin Integrity: Goal: Risk for impaired skin integrity will decrease Outcome: Not Progressing Pressure ulcer to right thigh  Problem: Activity: Goal: Risk for activity intolerance will decrease Outcome: Not Progressing bedbound     Problem: Bowel/Gastric: Goal: Will not experience complications related to bowel motility Outcome: Not Progressing Rectal tube in place

## 2015-06-12 NOTE — Progress Notes (Signed)
Initial Nutrition Assessment    INTERVENTION:   Meals and Snacks: Cater to patient preferences; add snacks between meals to promote po intake as pt eating 25% of meals or less  NUTRITION DIAGNOSIS:   Inadequate oral intake related to poor appetite as evidenced by meal completion < 25%, per patient/family report.  GOAL:   Patient will meet greater than or equal to 90% of their needs  MONITOR:    (Energy Intake, Anthropometrics, Digestive System, Electrolyte/Renal profile, Glucose Profile)  REASON FOR ASSESSMENT:   LOS    ASSESSMENT:    Pt admitted with MSSA bacteremia, ARF, diarrhea, cirrhosis with hepatic encephalopathy  Past Medical History  Diagnosis Date  . Seizures ([place])   . Stroke ([place])   . Brain tumor ([place])   . Diabetes mellitus without complication ([place])   . Arthritis   . Sleep apnea   . GERD (gastroesophageal reflux disease)   . Asthma   . Long QT interval   . Hypertension   . Panic attacks   . Anxiety   . Depression   . Syncope      Diet Order:  Diet heart healthy/carb modified Room service appropriate?: Yes; Fluid consistency:: Thin   Energy Intake: recorded po intake 0-25% of meals, pt reports no appetite  Skin:   (stage II pressure  ulcer)  Last BM:  11/25 diarrhea, rectal tube, C.diff negative; per family, pt had been on lactulose until yesterday   Electrolyte and Renal Profile:  Recent Labs Lab 06/09/15 0422 06/10/15 0502 06/10/15 1546 06/12/15 0519  BUN 32*  --  51* 53*  CREATININE 1.14* 1.80* 2.05* 1.46*  NA 134*  --  138 135  K 4.2  --  5.2* 3.2*  MG  --   --   --  1.8   Glucose Profile:   Recent Labs  06/11/15 2151 06/12/15 0739 06/12/15 1140  GLUCAP 152* 133* 135*   Meds: NS at 125 ml/hr, ss novolog,   Nutrition Focused Physical Exam: Nutrition-Focused physical exam completed. Findings are no fat depletion, no muscle depletion, and mild edema.    Height:   Ht Readings from Last 1 Encounters:  06/06/15 5\' 1"   (1.549 m)    Weight: reports weight of 318 pounds 1 month ago; 2.8% wt loss  Wt Readings from Last 1 Encounters:  06/06/15 309 lb 9.6 oz (140.434 kg)    Filed Weights   06/06/15 1049 06/06/15 1147 06/06/15 1900  Weight: 308 lb (139.708 kg) 311 lb (141.069 kg) 309 lb 9.6 oz (140.434 kg)   Wt Readings from Last 10 Encounters:  06/06/15 309 lb 9.6 oz (140.434 kg)  06/03/15 315 lb 12.8 oz (143.246 kg)  05/31/15 303 lb (137.44 kg)  04/09/15 319 lb (144.697 kg)    BMI:  Body mass index is 58.53 kg/(m^2).  Estimated Nutritional Needs:   Kcal:  [id number] kcals (BEE 964, 1.3 AF, 1.1-1.3 IF) using IBW 48 kg  Protein:  53-62 g (1.1-1.3 g/kg)   Fluid:  1440-1680 mL (30-35 ml/kg)   MODERATE Care Level    Kerman Passey MS, RD, LDN [phone] Pager

## 2015-06-13 LAB — BASIC METABOLIC PANEL
Anion gap: 5 (ref 5–15)
Anion gap: 9 (ref 5–15)
BUN: 61 mg/dL — ABNORMAL HIGH (ref 6–20)
BUN: 59 mg/dL — AB (ref 6–20)
CALCIUM: 8 mg/dL — AB (ref 8.9–10.3)
CALCIUM: 7.9 mg/dL — AB (ref 8.9–10.3)
CHLORIDE: 114 mmol/L — AB (ref 101–111)
CO2: 19 mmol/L — AB (ref 22–32)
CO2: 12 mmol/L — ABNORMAL LOW (ref 22–32)
CREATININE: 1.39 mg/dL — AB (ref 0.44–1.00)
Chloride: 112 mmol/L — ABNORMAL HIGH (ref 101–111)
Creatinine, Ser: 1.34 mg/dL — ABNORMAL HIGH (ref 0.44–1.00)
GFR calc Af Amer: 47 mL/min — ABNORMAL LOW (ref >60)
GFR calc non Af Amer: 39 mL/min — ABNORMAL LOW (ref >60)
GFR, EST AFRICAN AMERICAN: 45 mL/min — AB (ref >60)
GFR, EST NON AFRICAN AMERICAN: 41 mL/min — AB (ref >60)
Glucose, Bld: 140 mg/dL — ABNORMAL HIGH (ref 65–99)
Glucose, Bld: 180 mg/dL — ABNORMAL HIGH (ref 65–99)
POTASSIUM: 4.9 mmol/L (ref 3.5–5.1)
Potassium: 4.5 mmol/L (ref 3.5–5.1)
SODIUM: 136 mmol/L (ref 135–145)
SODIUM: 135 mmol/L (ref 135–145)

## 2015-06-13 LAB — GLUCOSE, CAPILLARY
GLUCOSE-CAPILLARY: 119 mg/dL — AB (ref 65–99)
Glucose-Capillary: 156 mg/dL — ABNORMAL HIGH (ref 65–99)
Glucose-Capillary: 152 mg/dL — ABNORMAL HIGH (ref 65–99)
Glucose-Capillary: 127 mg/dL — ABNORMAL HIGH (ref 65–99)

## 2015-06-13 LAB — HEMOGLOBIN: HEMOGLOBIN: 9.3 g/dL — AB (ref 12.0–16.0)

## 2015-06-13 MED ORDER — LOPERAMIDE HCL 2 MG PO CAPS
2.0000 mg | ORAL_CAPSULE | Freq: Two times a day (BID) | ORAL | Status: DC
  Administered 2015-06-13 – 2015-06-15 (×5): 2 mg via ORAL
  Filled 2015-06-13 (×4): qty 1

## 2015-06-13 MED ORDER — STERILE WATER FOR INJECTION IV SOLN
INTRAVENOUS | Status: DC
  Administered 2015-06-13 – 2015-06-15 (×4): via INTRAVENOUS
  Filled 2015-06-13 (×7): qty 850

## 2015-06-13 NOTE — Progress Notes (Signed)
[place] at [place] NAME: Sharon Kirby    MR#:  [id number]  DATE OF BIRTH:  25-Aug-1950  SUBJECTIVE:  CHIEF COMPLAINT:   Chief Complaint  Patient presents with  . Altered Mental Status  . Tachycardia   Comfortable. Denies any significant pain at present, diarrhea , still significant. Has rectal tube as well as Foley catheter due to minimal movement in bed or getting up. Morbidly obese. Blood cultures are negative as of 20th of November 2016. Blood cultures were positive for meth methicillin sensitive Staphylococcus aureus , now on Ancef, PICC line removed 06/08/2015. Suspected bacterial endocarditis. PICC line to be inserted today 24th of November 2016 . Patient was noted to have poor urinary output and had a elevated creatinine , initiated on IV fluids,  with improvement of creatinine as well as urinary output. Still has diarrheal stool despite Imodium given yesterday, but more concentrated. C. difficile negative November 19. Feels relatively good today. Better oral intake.   PICC line removed REVIEW OF SYSTEMS:  Review of Systems  Constitutional: Negative for fever, chills, weight loss, malaise/fatigue and diaphoresis.  HENT: Negative for congestion, ear discharge, ear pain, hearing loss, nosebleeds, sore throat and tinnitus.   Eyes: Negative for blurred vision, double vision and pain.  Respiratory: Negative for cough, hemoptysis, sputum production, shortness of breath and wheezing.   Cardiovascular: Negative for chest pain, palpitations, orthopnea, leg swelling and PND.  Gastrointestinal: Negative for heartburn, nausea, vomiting, abdominal pain, diarrhea, constipation and blood in stool.  Genitourinary: Negative for dysuria, urgency, frequency and hematuria.  Musculoskeletal: Negative for myalgias, back pain, joint pain, falls and neck pain.  Skin: Negative for itching and rash.  Neurological: Negative for dizziness, tingling,  tremors, focal weakness, seizures, weakness and headaches.  Endo/Heme/Allergies: Does not bruise/bleed easily.  Psychiatric/Behavioral: Negative for depression. The patient is not nervous/anxious and does not have insomnia.    DRUG ALLERGIES:   Allergies  Allergen Reactions  . Sulfa Antibiotics Anaphylaxis  . Penicillins Rash and Other (See Comments)    Has patient had a PCN reaction causing immediate rash, facial/tongue/throat swelling, SOB or lightheadedness with hypotension: No Has patient had a PCN reaction causing severe rash involving mucus membranes or skin necrosis: No Has patient had a PCN reaction that required hospitalization No Has patient had a PCN reaction occurring within the last 10 years: Yes If all of the above answers are "NO", then may proceed with Cephalosporin use.   VITALS:  Blood pressure 142/63, pulse 71, temperature 97.8 F (36.6 C), temperature source Oral, resp. rate 18, height 5\' 1"  (1.549 m), weight 140.434 kg (309 lb 9.6 oz), SpO2 96 %. PHYSICAL EXAMINATION:  Physical Exam   GENERAL:  64 y.o.-year-old patient lying in the bed , comfortable. Morbidly obese. weak and uncomfortable  EYES: Pupils equal, round, reactive to light and accommodation. No scleral icterus. Extraocular muscles intact.  HEENT: Head atraumatic, normocephalic. Oropharynx and nasopharynx clear.  NECK:  Supple, no jugular venous distention. No thyroid enlargement, no tenderness.  LUNGS: Normal breath sounds bilaterally, no wheezing, rales, rhonchi. No use of accessory muscles of respiration.  CARDIOVASCULAR: S1, S2 normal. No murmurs, rubs, or gallops.  ABDOMEN: Soft, nontender, nondistended. Bowel sounds present. No organomegaly or mass. Rectal tube, containing liquidy stool, Foley catheter is small amount of urine  EXTREMITIES: No cyanosis, clubbing or edema b/l.    NEUROLOGIC: Cranial nerves II through XII are intact. No focal Motor or sensory deficits b/l.  PSYCHIATRIC: The patient  is alert, oriented 3 . SKIN: No obvious rash, lesion, or ulcer.    LABORATORY PANEL:   CBC  Recent Labs Lab 06/12/15 0519 06/13/15 0531  WBC 10.7  --   HGB 8.3* 9.3*  HCT 24.9*  --   PLT 139*  --    ------------------------------------------------------------------------------------------------------------------ Chemistries   Recent Labs Lab 06/09/15 0422  06/12/15 0519 06/13/15 0531  NA 134*  < > 135 136  K 4.2  < > 3.2* 4.5  CL 109  < > 115* 112*  CO2 18*  < > 17* 19*  GLUCOSE 144*  < > 135* 140*  BUN 32*  < > 53* 61*  CREATININE 1.14*  < > 1.46* 1.39*  CALCIUM 8.4*  < > 6.7* 8.0*  MG  --   --  1.8  --   AST 79*  --   --   --   ALT 57*  --   --   --   ALKPHOS 378*  --   --   --   BILITOT 1.1  --   --   --   < > = values in this interval not displayed. RADIOLOGY:  No results found. ASSESSMENT AND PLAN:  64 y.o. female with a known history of diabetic mellitus, hypertension, anxiety and depression and multiple other medical problems is brought into the ED due to fever, confusion. Recent admission for MSSA bacteremia  * MSSA bacteremia, Recent MSSA bacteremia on Cefazolin thru PICC. Discharged on 11/18. Bcx again growing MSSA.  back on cefazolin . Repeated blood cultures 11/20 are negative. Status post PICC line 24th of November TEE showed no endocarditis. MRI lumbar could not be done due to her obesity. CT lumbar with contrast showed no abcess/infection. Will need antibiotic therapy for 6 weeks from November 20 through 07/20/2015 . *Acute renal failure , better on advanced IV fluids , follow kidney function tomorrow , likely due to dehydration due to diarrhea, improved oral intake   * Cirrhosis of Liver with hepatic encephalopathy,  off lactulose, now on xifaxan, discontinue rectal tube if the diarrhea subsides , add scheduled Imodium, continue Imodium when necessary, some improvement in consistency of patient's stools , following closely   * Chronic pain  syndrome, no change in pain medication doses  * Diabetes mellitus Continue sliding scale insulin  * Essential hypertension, Now off ACE inhibitor, continue Lopressor, improved patient's blood pressure readings .    *DVT prophylaxis with Heprain SQ  * History of depression and anxiety Continue home medications Klonopin, relatively well controlled   *Generaliweakness, continue physical therapy, does not want to go to skilled nursing facility, hopes to leave her home with home health, which I am fearful is just a wishful thinking, since upon discussion with nursing staff. It appears that patient has difficulty even turning in the bed.   *Diarrhea, ongoing, etiology is unclear, C. difficile 19th of November 2016, may need to repeat C. Difficile, continue Imodium for now, round the clock and as needed All the records are reviewed and case discussed with Care Management/Social Worker. Management plans discussed with the patient, family and they are in agreement.  CODE STATUS: Full Code  TOTAL TIME TAKING CARE OF THIS PATIENT: 40 minutes.  Discussed with patient's son today again   [facility] M.D on 06/13/2015 at 2:20 PM  Between 7am to 6pm - Pager - [phone]  After 6pm go to [email or web address] - [facility]  Office  [phone]  CC: Primary care physician; Madelyn Brunner, MD

## 2015-06-14 LAB — CBC
HCT: 27.1 % — ABNORMAL LOW (ref 35.0–47.0)
Hemoglobin: 8.9 g/dL — ABNORMAL LOW (ref 12.0–16.0)
MCH: 29.9 pg (ref 26.0–34.0)
MCHC: 33 g/dL (ref 32.0–36.0)
MCV: 90.6 fL (ref 80.0–100.0)
PLATELETS: 205 10*3/uL (ref 150–440)
RBC: 2.99 MIL/uL — AB (ref 3.80–5.20)
RDW: 23.2 % — AB (ref 11.5–14.5)
WBC: 9.1 10*3/uL (ref 3.6–11.0)

## 2015-06-14 LAB — BASIC METABOLIC PANEL
Anion gap: 4 — ABNORMAL LOW (ref 5–15)
BUN: 57 mg/dL — ABNORMAL HIGH (ref 6–20)
CALCIUM: 8 mg/dL — AB (ref 8.9–10.3)
CHLORIDE: 114 mmol/L — AB (ref 101–111)
CO2: 20 mmol/L — ABNORMAL LOW (ref 22–32)
CREATININE: 1.09 mg/dL — AB (ref 0.44–1.00)
GFR, EST NON AFRICAN AMERICAN: 52 mL/min — AB (ref >60)
Glucose, Bld: 129 mg/dL — ABNORMAL HIGH (ref 65–99)
Potassium: 4.7 mmol/L (ref 3.5–5.1)
SODIUM: 138 mmol/L (ref 135–145)

## 2015-06-14 LAB — GLUCOSE, CAPILLARY
GLUCOSE-CAPILLARY: 117 mg/dL — AB (ref 65–99)
GLUCOSE-CAPILLARY: 168 mg/dL — AB (ref 65–99)
Glucose-Capillary: 182 mg/dL — ABNORMAL HIGH (ref 65–99)
Glucose-Capillary: 169 mg/dL — ABNORMAL HIGH (ref 65–99)

## 2015-06-14 LAB — C DIFFICILE QUICK SCREEN W PCR REFLEX
C DIFFICILE (CDIFF) INTERP: NEGATIVE
C DIFFICILE (CDIFF) TOXIN: NEGATIVE
C Diff antigen: NEGATIVE

## 2015-06-14 MED ORDER — CHOLESTYRAMINE LIGHT 4 G PO PACK
4.0000 g | PACK | Freq: Two times a day (BID) | ORAL | Status: DC
  Administered 2015-06-14 – 2015-06-15 (×3): 4 g via ORAL
  Filled 2015-06-14 (×4): qty 1

## 2015-06-14 NOTE — Progress Notes (Signed)
[facility] [place] NAME: Sharon Kirby    MR#:  [id number]  DATE OF BIRTH:  30-Aug-1950  SUBJECTIVE:  CHIEF COMPLAINT:   Chief Complaint  Patient presents with  . Altered Mental Status  . Tachycardia   Comfortable. Denies any significant pain at present, diarrhea , still significant. Has rectal tube as well as Foley catheter due to minimal movement in bed or getting up. Morbidly obese. Blood cultures are negative as of 20th of November 2016. Blood cultures were positive for meth methicillin sensitive Staphylococcus aureus , now on Ancef, PICC line removed 06/08/2015. Suspected bacterial endocarditis. PICC line to be inserted today 24th of November 2016 . Patient was noted to have poor urinary output and had a elevated creatinine , initiated on IV fluids,  with improvement of creatinine as well as urinary output. Still has diarrheal stool despite Imodium given yesterday, but more concentrated. C. difficile negative November 19. Feels relatively good today. Better oral intake. Was noted to be acidotic on BMP, initiated on bicarbonate drip, feels comfortable, CO2 level is 20 today. Still has diarrheal stool, C. difficile negative. Initiated on cholestyramine today 27th of November  PICC line removed REVIEW OF SYSTEMS:  Review of Systems  Constitutional: Negative for fever, chills, weight loss, malaise/fatigue and diaphoresis.  HENT: Negative for congestion, ear discharge, ear pain, hearing loss, nosebleeds, sore throat and tinnitus.   Eyes: Negative for blurred vision, double vision and pain.  Respiratory: Negative for cough, hemoptysis, sputum production, shortness of breath and wheezing.   Cardiovascular: Negative for chest pain, palpitations, orthopnea, leg swelling and PND.  Gastrointestinal: Negative for heartburn, nausea, vomiting, abdominal pain, diarrhea, constipation and blood in stool.  Genitourinary: Negative for dysuria, urgency,  frequency and hematuria.  Musculoskeletal: Negative for myalgias, back pain, joint pain, falls and neck pain.  Skin: Negative for itching and rash.  Neurological: Negative for dizziness, tingling, tremors, focal weakness, seizures, weakness and headaches.  Endo/Heme/Allergies: Does not bruise/bleed easily.  Psychiatric/Behavioral: Negative for depression. The patient is not nervous/anxious and does not have insomnia.    DRUG ALLERGIES:   Allergies  Allergen Reactions  . Sulfa Antibiotics Anaphylaxis  . Penicillins Rash and Other (See Comments)    Has patient had a PCN reaction causing immediate rash, facial/tongue/throat swelling, SOB or lightheadedness with hypotension: No Has patient had a PCN reaction causing severe rash involving mucus membranes or skin necrosis: No Has patient had a PCN reaction that required hospitalization No Has patient had a PCN reaction occurring within the last 10 years: Yes If all of the above answers are "NO", then may proceed with Cephalosporin use.   VITALS:  Blood pressure 121/65, pulse 71, temperature 98.2 F (36.8 C), temperature source Oral, resp. rate 18, height 5\' 1"  (1.549 m), weight 140.434 kg (309 lb 9.6 oz), SpO2 97 %. PHYSICAL EXAMINATION:  Physical Exam   GENERAL:  64 y.o.-year-old patient lying in the bed , comfortable. Morbidly obese. weak and uncomfortable  EYES: Pupils equal, round, reactive to light and accommodation. No scleral icterus. Extraocular muscles intact.  HEENT: Head atraumatic, normocephalic. Oropharynx and nasopharynx clear.  NECK:  Supple, no jugular venous distention. No thyroid enlargement, no tenderness.  LUNGS: Normal breath sounds bilaterally, no wheezing, rales, rhonchi. No use of accessory muscles of respiration.  CARDIOVASCULAR: S1, S2 normal. No murmurs, rubs, or gallops.  ABDOMEN: Soft, nontender, nondistended. Bowel sounds present. No organomegaly or mass. Rectal tube, containing liquidy stool, Foley catheter ,  moderate amount of amber colored urine  EXTREMITIES: No cyanosis, clubbing or edema b/l.    NEUROLOGIC: Cranial nerves II through XII are intact. No focal Motor or sensory deficits b/l.   PSYCHIATRIC: The patient is alert, oriented 3 . SKIN: No obvious rash, lesion, or ulcer.    LABORATORY PANEL:   CBC  Recent Labs Lab 06/14/15 0620  WBC 9.1  HGB 8.9*  HCT 27.1*  PLT 205   ------------------------------------------------------------------------------------------------------------------ Chemistries   Recent Labs Lab 06/09/15 0422  06/12/15 0519  06/14/15 0620  NA 134*  < > 135  < > 138  K 4.2  < > 3.2*  < > 4.7  CL 109  < > 115*  < > 114*  CO2 18*  < > 17*  < > 20*  GLUCOSE 144*  < > 135*  < > 129*  BUN 32*  < > 53*  < > 57*  CREATININE 1.14*  < > 1.46*  < > 1.09*  CALCIUM 8.4*  < > 6.7*  < > 8.0*  MG  --   --  1.8  --   --   AST 79*  --   --   --   --   ALT 57*  --   --   --   --   ALKPHOS 378*  --   --   --   --   BILITOT 1.1  --   --   --   --   < > = values in this interval not displayed. RADIOLOGY:  No results found. ASSESSMENT AND PLAN:  64 y.o. female with a known history of diabetic mellitus, hypertension, anxiety and depression and multiple other medical problems is brought into the ED due to fever, confusion. Recent admission for MSSA bacteremia  * MSSA bacteremia, Recent MSSA bacteremia on Cefazolin thru PICC. Discharged on 11/18. Bcx again growing MSSA.  back on cefazolin . Repeated blood cultures 11/20 are negative. Status post PICC line 24th of November TEE showed no endocarditis. MRI lumbar could not be done due to her obesity. CT lumbar with contrast showed no abcess/infection. Will need antibiotic therapy for 6 weeks from November 20 through 07/20/2015 . *Acute renal failure , better on advanced IV fluids , follow kidney function tomorrow , likely due to dehydration due to diarrhea, but now has improved oral intake   *Metabolic acidosis likely due  to diarrhea and bicarbonate loss with diarrhea, now on bicarbonate intravenously CO2 has improved, adding cholestyramine to control diarrheal stool, possibly discontinue bicarbonate IV drip if diarrhea subsides tomorrow  * Cirrhosis of Liver with hepatic encephalopathy,  off lactulose, now on xifaxan, discontinue rectal tube if the diarrhea subsides , now onscheduled Imodium, Imodium when necessary,  adding cholestyramine , following closely   *Diarrhea, etiology unclear, negative C. difficile , was on tlactulose, which was discontinued , continue Imodium and cholestyramine, following stool output, discontinue rectal tube if diarrhea subsides   * Chronic pain syndrome, no change in pain medication doses  * Diabetes mellitus Continue sliding scale insulin  * Essential hypertension, Now off ACE inhibitor, continue Lopressor, improved patient's blood pressure readings .    *DVT prophylaxis with Heprain SQ  * History of depression and anxiety Continue home medications Klonopin, relatively well controlled   *Generaliweakness, continue physical therapy, does not want to go to skilled nursing facility, hopes to leave her home with home health, which I am fearful is just a wishful thinking, since upon discussion with nursing staff.  It appears that patient has difficulty even turning in the bed.   *Diarrhea, ongoing, etiology is unclear, C. difficile 19th of November 2016, may need to repeat C. Difficile, continue Imodium for now, round the clock and as needed All the records are reviewed and case discussed with Care Management/Social Worker. Management plans discussed with the patient, family and they are in agreement.  CODE STATUS: Full Code  TOTAL TIME TAKING CARE OF THIS PATIENT: 40 minutes.  Discussed with patient's son today again   [facility] M.D on 06/14/2015 at 4:47 PM  Between 7am to 6pm - Pager - [phone]  After 6pm go to [email or web address] - password [email or web address]  Office  [phone]  CC: Primary care physician; Madelyn Brunner, MD

## 2015-06-15 DIAGNOSIS — E119 Type 2 diabetes mellitus without complications: Secondary | ICD-10-CM | POA: Diagnosis not present

## 2015-06-15 LAB — BASIC METABOLIC PANEL
ANION GAP: 3 — AB (ref 5–15)
BUN: 53 mg/dL — ABNORMAL HIGH (ref 6–20)
CO2: 24 mmol/L (ref 22–32)
Calcium: 8 mg/dL — ABNORMAL LOW (ref 8.9–10.3)
Chloride: 111 mmol/L (ref 101–111)
Creatinine, Ser: 0.93 mg/dL (ref 0.44–1.00)
GFR calc non Af Amer: >60 mL/min (ref >60)
Glucose, Bld: 146 mg/dL — ABNORMAL HIGH (ref 65–99)
Potassium: 4.5 mmol/L (ref 3.5–5.1)
Sodium: 138 mmol/L (ref 135–145)

## 2015-06-15 LAB — GLUCOSE, CAPILLARY
GLUCOSE-CAPILLARY: 110 mg/dL — AB (ref 65–99)
GLUCOSE-CAPILLARY: 151 mg/dL — AB (ref 65–99)

## 2015-06-15 MED ORDER — ONDANSETRON HCL 4 MG PO TABS: 4.0000 mg | ORAL_TABLET | Freq: Four times a day (QID) | ORAL | Status: AC | PRN

## 2015-06-15 MED ORDER — CEFAZOLIN SODIUM-DEXTROSE 2-3 GM-% IV SOLR: 2.0000 g | Freq: Three times a day (TID) | INTRAVENOUS | Status: AC

## 2015-06-15 MED ORDER — METOPROLOL TARTRATE 25 MG PO TABS
25.0000 mg | ORAL_TABLET | Freq: Two times a day (BID) | ORAL | Status: AC
Start: 2015-06-15 — End: ?

## 2015-06-15 MED ORDER — HYDROCODONE-ACETAMINOPHEN 5-325 MG PO TABS: 1.0000 | ORAL_TABLET | Freq: Four times a day (QID) | ORAL | Status: AC | PRN

## 2015-06-15 MED ORDER — RIFAXIMIN 550 MG PO TABS: 550.0000 mg | ORAL_TABLET | Freq: Two times a day (BID) | ORAL | Status: AC

## 2015-06-15 MED ORDER — ACETAMINOPHEN 325 MG PO TABS: 650.0000 mg | ORAL_TABLET | Freq: Four times a day (QID) | ORAL | Status: AC | PRN

## 2015-06-15 MED ORDER — ALBUTEROL SULFATE (2.5 MG/3ML) 0.083% IN NEBU: 2.5000 mg | INHALATION_SOLUTION | RESPIRATORY_TRACT | Status: AC | PRN

## 2015-06-15 MED ORDER — LOPERAMIDE HCL 2 MG PO CAPS: 2.0000 mg | ORAL_CAPSULE | ORAL | Status: AC | PRN

## 2015-06-15 MED ORDER — ALUM & MAG HYDROXIDE-SIMETH 200-200-20 MG/5ML PO SUSP: 30.0000 mL | Freq: Four times a day (QID) | ORAL | Status: AC | PRN

## 2015-06-15 MED ORDER — CHOLESTYRAMINE LIGHT 4 G PO PACK: 4.0000 g | PACK | Freq: Two times a day (BID) | ORAL | Status: AC

## 2015-06-15 NOTE — Discharge Instructions (Signed)
Discharge with Foley catheter- Try removing foley after few days- once more strength to get up and sit on commod.  She have liver cirrhosis- stopped lactulose due to diarrhea in hospital.    We need to stop immodium and resume lactulose to prevent encephalopathy- once her diarrhea improves.

## 2015-06-15 NOTE — Discharge Summary (Signed)
[facility] NAME: Sharon Kirby    MR#:  [id number]  DATE OF BIRTH:  1951-05-19  DATE OF ADMISSION:  06/06/2015 ADMITTING PHYSICIAN: Demetrios Loll, MD  DATE OF DISCHARGE: 06/15/2015  PRIMARY CARE PHYSICIAN: Madelyn Brunner, MD    ADMISSION DIAGNOSIS:  Altered Mental Status  DISCHARGE DIAGNOSIS:  Principal Problem:   Sepsis ([place]) Active Problems:  Bacteremia   Pressure ulcer   SECONDARY DIAGNOSIS:   Past Medical History  Diagnosis Date  . Seizures ([place])   . Stroke ([place])   . Brain tumor ([place])   . Diabetes mellitus without complication ([place])   . Arthritis   . Sleep apnea   . GERD (gastroesophageal reflux disease)   . Asthma   . Long QT interval   . Hypertension   . Panic attacks   . Anxiety   . Depression   . Syncope     HOSPITAL COURSE:   64 y.o. female with a known history of diabetic mellitus, hypertension, anxiety and depression and multiple other medical problems is brought into the ED due to fever, confusion. Recent admission for MSSA bacteremia  * MSSA bacteremia, Recent MSSA bacteremia on Cefazolin thru PICC. Discharged on 11/18. Bcx again growing MSSA. back on cefazolin . Repeated blood cultures 11/20 are negative. Status post PICC line 24th of November TEE showed no endocarditis. MRI lumbar could not be done due to her obesity. CT lumbar with contrast showed no abcess/infection. Will need antibiotic therapy for 6 weeks from November 20 through 07/20/2015 . *Acute renal failure , better on advanced IV fluids , follow kidney function - stable , likely due to dehydration due to diarrhea, but now has improved oral intake   *Metabolic acidosis likely due to diarrhea and bicarbonate loss with diarrhea, now on bicarbonate intravenously CO2 has improved, adding cholestyramine to control diarrheal stool  * Cirrhosis of Liver with hepatic encephalopathy, off lactulose, now on xifaxan, discontinued  rectal tube  now on scheduled Imodium, Imodium when necessary, adding cholestyramine , following closely   May need to resume lactulose- Once diarrhea resolves.  *Diarrhea, etiology unclear, negative C. difficile , was on tlactulose, which was discontinued , continue Imodium and cholestyramine, following stool output, discontinued rectal tube.  * Chronic pain syndrome, no change in pain medication doses  * Diabetes mellitus Continue sliding scale insulin  * Essential hypertension, Now off ACE inhibitor, continue Lopressor, improved patient's blood pressure readings .   *DVT prophylaxis with Heprain SQ  * History of depression and anxiety Continue home medications Klonopin, relatively well controlled   *Generaliweakness, continue physical therapy, pt is very weak and can not turn in bed on her own- Rehab placement.   DISCHARGE CONDITIONS:   Stable.  CONSULTS OBTAINED:  Treatment Team:  Adrian Prows, MD  DRUG ALLERGIES:   Allergies  Allergen Reactions  . Sulfa Antibiotics Anaphylaxis  . Penicillins Rash and Other (See Comments)    Has patient had a PCN reaction causing immediate rash, facial/tongue/throat swelling, SOB or lightheadedness with hypotension: No Has patient had a PCN reaction causing severe rash involving mucus membranes or skin necrosis: No Has patient had a PCN reaction that required hospitalization No Has patient had a PCN reaction occurring within the last 10 years: Yes If all of the above answers are "NO", then may proceed with Cephalosporin use.    DISCHARGE MEDICATIONS:   Current Discharge Medication List    START taking these medications  Details  acetaminophen (TYLENOL) 325 MG tablet Take 2 tablets (650 mg total) by mouth every 6 (six) hours as needed for mild pain (or Fever >/= 101). Qty: 30 tablet, Refills: 0    albuterol (PROVENTIL) (2.5 MG/3ML) 0.083% nebulizer solution Take 3 mLs (2.5 mg total) by nebulization every 2 (two) hours as  needed for wheezing. Qty: 75 mL, Refills: 12    alum & mag hydroxide-simeth (MAALOX/MYLANTA) 200-200-20 MG/5ML suspension Take 30 mLs by mouth every 6 (six) hours as needed for indigestion or heartburn. Qty: 355 mL, Refills: 0    ceFAZolin (ANCEF) 2-3 GM-% SOLR Inject 50 mLs (2 g total) into the vein every 8 (eight) hours. Continue until 07/20/15. Qty: 104 each, Refills: 0    cholestyramine light (PREVALITE) 4 G packet Take 1 packet (4 g total) by mouth 2 (two) times daily. Qty: 14 packet, Refills: 0    loperamide (IMODIUM) 2 MG capsule Take 1 capsule (2 mg total) by mouth as needed for diarrhea or loose stools. Qty: 30 capsule, Refills: 0    metoprolol tartrate (LOPRESSOR) 25 MG tablet Take 1 tablet (25 mg total) by mouth 2 (two) times daily. Qty: 60 tablet, Refills: 0    ondansetron (ZOFRAN) 4 MG tablet Take 1 tablet (4 mg total) by mouth every 6 (six) hours as needed for nausea. Qty: 20 tablet, Refills: 0    rifaximin (XIFAXAN) 550 MG TABS tablet Take 1 tablet (550 mg total) by mouth 2 (two) times daily. Qty: 60 tablet, Refills: 0      CONTINUE these medications which have CHANGED   Details  HYDROcodone-acetaminophen (NORCO/VICODIN) 5-325 MG tablet Take 1 tablet by mouth every 6 (six) hours as needed for moderate pain. Qty: 15 tablet, Refills: 0      CONTINUE these medications which have NOT CHANGED   Details  aspirin EC 81 MG tablet Take 81 mg by mouth daily.    Calcium Carb-Cholecalciferol (CALCIUM 600+D) 600-800 MG-UNIT TABS Take 1 tablet by mouth daily.    cetirizine (ZYRTEC) 10 MG tablet Take 10 mg by mouth daily.    Cholecalciferol (VITAMIN D) 2000 UNITS CAPS Take 2,000 Units by mouth daily.    clonazePAM (KLONOPIN) 1 MG tablet Take 1 mg by mouth 2 (two) times daily.    dimenhyDRINATE (DRAMAMINE) 50 MG tablet Take 50 mg by mouth every 8 (eight) hours as needed for nausea.    docusate sodium (COLACE) 100 MG capsule Take 100 mg by mouth every 12 (twelve) hours as  needed for mild constipation or moderate constipation.     ferrous sulfate 325 (65 FE) MG tablet Take 1 tablet (325 mg total) by mouth 2 (two) times daily with a meal. Qty: 120 tablet, Refills: 0    glipiZIDE (GLUCOTROL XL) 10 MG 24 hr tablet Take 10 mg by mouth daily.    hydrOXYzine (ATARAX/VISTARIL) 25 MG tablet Take 25 mg by mouth every 8 (eight) hours as needed for itching.     lisinopril (PRINIVIL,ZESTRIL) 10 MG tablet Take 10 mg by mouth daily.    methocarbamol (ROBAXIN) 500 MG tablet Take 500 mg by mouth every 6 (six) hours as needed for muscle spasms.    Multiple Vitamin (MULTIVITAMIN WITH MINERALS) TABS tablet Take 1 tablet by mouth daily.    omeprazole (PRILOSEC) 20 MG capsule Take 20 mg by mouth daily.    PARoxetine (PAXIL) 40 MG tablet Take 40 mg by mouth daily.    Turmeric Curcumin 500 MG CAPS Take 500 mg by mouth daily.  zinc gluconate 50 MG tablet Take 50 mg by mouth daily.      STOP taking these medications     ceFAZolin 2 g in dextrose 5 % 50 mL ivpb      ibuprofen (ADVIL,MOTRIN) 800 MG tablet      lactulose (CHRONULAC) 10 GM/15ML solution      metFORMIN (GLUCOPHAGE) 1000 MG tablet      oxyCODONE-acetaminophen (ROXICET) 5-325 MG tablet      Potassium Gluconate 550 MG TABS          DISCHARGE INSTRUCTIONS:    Discharge with Foley catheter- Try removing foley after few days- once more strength to get up and sit on commod.  She have liver cirrhosis- stopped lactulose due to diarrhea in hospital.  We need to stop immodium and resume lactulose to prevent encephalopathy- once her diarrhea improves   If you experience worsening of your admission symptoms, develop shortness of breath, life threatening emergency, suicidal or homicidal thoughts you must seek medical attention immediately by calling 911 or calling your MD immediately  if symptoms less severe.  You Must read complete instructions/literature along with all the possible adverse reactions/side  effects for all the Medicines you take and that have been prescribed to you. Take any new Medicines after you have completely understood and accept all the possible adverse reactions/side effects.   Please note  You were cared for by a hospitalist during your hospital stay. If you have any questions about your discharge medications or the care you received while you were in the hospital after you are discharged, you can call the unit and asked to speak with the hospitalist on call if the hospitalist that took care of you is not available. Once you are discharged, your primary care physician will handle any further medical issues. Please note that NO REFILLS for any discharge medications will be authorized once you are discharged, as it is imperative that you return to your primary care physician (or establish a relationship with a primary care physician if you do not have one) for your aftercare needs so that they can reassess your need for medications and monitor your lab values.    Today   CHIEF COMPLAINT:   Chief Complaint  Patient presents with  . Altered Mental Status  . Tachycardia    HISTORY OF PRESENT ILLNESS:  Sharon Kirby  is a 64 y.o. female with a known history of hypertension, diabetes and recent sepsis. The patient was just discharged from our hospital to nursing home after treatment of sepsis, bacteremia and cellulitis. She was supposed to continue IV cefazolin for 2 weeks recommended by Dr. Ola Spurr. The patient was found confused and tachycardia yesterday. So she was sent to ED from nursing home today. She was found to have a fever, leukocytosis and tachycardia. She was treated with vancomycin ED. She had diarrhea which is possibly due to lactulose. According to patient's daughter, she was started with the lactulose for liver cirrhosis.  VITAL SIGNS:  Blood pressure 138/64, pulse 74, temperature 98.4 F (36.9 C), temperature source Oral, resp. rate 18, height 5\' 1"   (1.549 m), weight 140.434 kg (309 lb 9.6 oz), SpO2 96 %.  I/O:   Intake/Output Summary (Last 24 hours) at 06/15/15 1315 Last data filed at 06/15/15 0915  Gross per 24 hour  Intake   2669 ml  Output    900 ml  Net   1769 ml    PHYSICAL EXAMINATION:   GENERAL: 64 y.o.-year-old patient lying in  the bed , comfortable. Morbidly obese. weak EYES: Pupils equal, round, reactive to light and accommodation. No scleral icterus. Extraocular muscles intact.  HEENT: Head atraumatic, normocephalic. Oropharynx and nasopharynx clear.  NECK: Supple, no jugular venous distention. No thyroid enlargement, no tenderness.  LUNGS: Normal breath sounds bilaterally, no wheezing, rales, rhonchi. No use of accessory muscles of respiration.  CARDIOVASCULAR: S1, S2 normal. No murmurs, rubs, or gallops.  ABDOMEN: Soft, nontender, nondistended. Bowel sounds present. No organomegaly or mass.    Foley catheter in place, EXTREMITIES: No cyanosis, clubbing or edema b/l.  NEUROLOGIC: Cranial nerves II through XII are intact. No focal Motor or sensory deficits b/l.  PSYCHIATRIC: The patient is alert, oriented 3 . SKIN: No obvious rash, lesion, or ulcer.    DATA REVIEW:   CBC  Recent Labs Lab 06/14/15 0620  WBC 9.1  HGB 8.9*  HCT 27.1*  PLT 205    Chemistries   Recent Labs Lab 06/09/15 0422  06/12/15 0519  06/15/15 0509  NA 134*  < > 135  < > 138  K 4.2  < > 3.2*  < > 4.5  CL 109  < > 115*  < > 111  CO2 18*  < > 17*  < > 24  GLUCOSE 144*  < > 135*  < > 146*  BUN 32*  < > 53*  < > 53*  CREATININE 1.14*  < > 1.46*  < > 0.93  CALCIUM 8.4*  < > 6.7*  < > 8.0*  MG  --   --  1.8  --   --   AST 79*  --   --   --   --   ALT 57*  --   --   --   --   ALKPHOS 378*  --   --   --   --   BILITOT 1.1  --   --   --   --   < > = values in this interval not displayed.  Cardiac Enzymes No results for input(s): TROPONINI in the last 168 hours.  Microbiology Results  Results for orders placed or  performed during the hospital encounter of 06/06/15  Culture, blood (routine x 2)     Status: None   Collection Time: 06/06/15 11:05 AM  Result Value Ref Range Status   Specimen Description BLOOD RIGHT PICC  Final   Special Requests   Final    BOTTLES DRAWN AEROBIC AND ANAEROBIC ANAEROBIC 3ML AEROBIC 6ML   Culture  Setup Time   Final    GRAM POSITIVE COCCI IN CLUSTERS IN BOTH AEROBIC AND ANAEROBIC BOTTLES CRITICAL RESULT CALLED TO, READ BACK BY AND VERIFIED WITH: EMILY HARRON AT [id number] ON 06/07/15 CTJ    Culture   Final    STAPHYLOCOCCUS AUREUS IN BOTH AEROBIC AND ANAEROBIC BOTTLES    Report Status 06/09/2015 FINAL  Final   Organism ID, Bacteria STAPHYLOCOCCUS AUREUS  Final      Susceptibility   Staphylococcus aureus - MIC*    CIPROFLOXACIN >=8 RESISTANT Resistant     ERYTHROMYCIN 4 RESISTANT Resistant     GENTAMICIN <=0.5 SENSITIVE Sensitive     OXACILLIN <=0.25 SENSITIVE Sensitive     TRIMETH/SULFA <=10 SENSITIVE Sensitive     CLINDAMYCIN <=0.25 RESISTANT Resistant     CEFOXITIN SCREEN NEGATIVE Sensitive     Inducible Clindamycin Value in next row Resistant      POSITIVEINDUCIBLE CLINDAMYCIN RESISTANCE - A positive ICR test is indicative of inducible resistance to  macrolides, lincosamides, and type B streptogramin.  This isolate is presumed to be resistant to Clindamycin, however, Clindamycin may still be effective in some patients.     TETRACYCLINE Value in next row Sensitive      SENSITIVE<=1    * STAPHYLOCOCCUS AUREUS  Urine culture     Status: None   Collection Time: 06/06/15 11:05 AM  Result Value Ref Range Status   Specimen Description URINE, RANDOM  Final   Special Requests NONE  Final   Culture NO GROWTH 2 DAYS  Final   Report Status 06/08/2015 FINAL  Final  Culture, blood (routine x 2)     Status: None   Collection Time: 06/06/15 11:48 AM  Result Value Ref Range Status   Specimen Description BLOOD LEFT AC  Final   Special Requests   Final    BOTTLES DRAWN AEROBIC  AND ANAEROBIC  AER 3CC ANA 1CC   Culture  Setup Time   Final    GRAM POSITIVE COCCI IN CLUSTERS IN BOTH AEROBIC AND ANAEROBIC BOTTLES CRITICAL RESULT CALLED TO, READ BACK BY AND VERIFIED WITH: EMILY HARRON AT [id number] ON 06/07/15 CTJ    Culture   Final    STAPHYLOCOCCUS AUREUS IN BOTH AEROBIC AND ANAEROBIC BOTTLES    Report Status 06/09/2015 FINAL  Final   Organism ID, Bacteria STAPHYLOCOCCUS AUREUS  Final      Susceptibility   Staphylococcus aureus - MIC*    CIPROFLOXACIN >=8 RESISTANT Resistant     ERYTHROMYCIN 4 RESISTANT Resistant     GENTAMICIN <=0.5 SENSITIVE Sensitive     OXACILLIN <=0.25 SENSITIVE Sensitive     TRIMETH/SULFA <=10 SENSITIVE Sensitive     CLINDAMYCIN <=0.25 RESISTANT Resistant     CEFOXITIN SCREEN NEGATIVE Sensitive     Inducible Clindamycin Value in next row Resistant      POSITIVEINDUCIBLE CLINDAMYCIN RESISTANCE - A positive ICR test is indicative of inducible resistance to macrolides, lincosamides, and type B streptogramin.  This isolate is presumed to be resistant to Clindamycin, however, Clindamycin may still be effective in some patients.     TETRACYCLINE Value in next row Sensitive      SENSITIVE<=1    * STAPHYLOCOCCUS AUREUS  C difficile quick scan w PCR reflex     Status: None   Collection Time: 06/06/15 11:48 AM  Result Value Ref Range Status   C Diff antigen NEGATIVE NEGATIVE Final   C Diff toxin NEGATIVE NEGATIVE Final   C Diff interpretation Negative for C. difficile  Final  Culture, blood (routine x 2)     Status: None   Collection Time: 06/07/15 12:08 PM  Result Value Ref Range Status   Specimen Description BLOOD LEFT WRIST  Final   Special Requests BOTTLES DRAWN AEROBIC AND ANAEROBIC  1CC  Final   Culture NO GROWTH 5 DAYS  Final   Report Status 06/12/2015 FINAL  Final  Culture, blood (routine x 2)     Status: None   Collection Time: 06/07/15 12:08 PM  Result Value Ref Range Status   Specimen Description BLOOD PICC LINE  Final   Special  Requests   Final    BOTTLES DRAWN AEROBIC AND ANAEROBIC  AER [place] ANA Point   Culture NO GROWTH 5 DAYS  Final   Report Status 06/12/2015 FINAL  Final  C difficile quick scan w PCR reflex     Status: None   Collection Time: 06/14/15 12:21 PM  Result Value Ref Range Status   C Diff antigen NEGATIVE NEGATIVE  Final   C Diff toxin NEGATIVE NEGATIVE Final   C Diff interpretation Negative for C. difficile  Final    RADIOLOGY:  No results found.     Management plans discussed with the patient, family and they are in agreement.  CODE STATUS:     Code Status Orders        Start     Ordered   06/06/15 1441  Full code   Continuous     06/06/15 1440    Advance Directive Documentation        Most Recent Value   Type of Advance Directive  Healthcare Power of Attorney, Living will   Pre-existing out of facility DNR order (yellow form or pink MOST form)     "MOST" Form in Place?        TOTAL TIME TAKING CARE OF THIS PATIENT: 35 minutes.    Vaughan Basta M.D on 06/15/2015 at 1:15 PM  Between 7am to 6pm - Pager - [phone]  After 6pm go to [email or web address] - password [email or web address] [employer] [facility]  Office  [phone]  CC: Primary care physician; Madelyn Brunner, MD   Note: This dictation was prepared with Dragon dictation along with smaller phrase technology. Any transcriptional errors that result from this process are unintentional.

## 2015-06-15 NOTE — Clinical Social Work Note (Signed)
MD to discharge patient today to [facility]. Auth received by Amy at [facility]. Discharge information sent to [facility]. Patient's nurse to call report. Patient's son in patient's room and aware of discharge. Request for patient to go by EMS.  Shela Leff MSW,LCSW [phone]

## 2015-06-15 NOTE — Progress Notes (Signed)
Pt d/c to [facility]; report called to Sandrea Matte, LPN at [facility]; pt being d/c w/ foley and PICC; foley area clean and dry at time of d/c, no s/s of infection; PICC dressing CDI at time of d/c; pt a/o at time of d/c, no acute distress; pt left unit via EMS

## 2015-06-15 NOTE — Clinical Social Work Placement (Signed)
   CLINICAL SOCIAL WORK PLACEMENT  NOTE  Date:  06/15/2015  Patient Details  Name: Sharon Kirby MRN: [id number] Date of Birth: 1951/07/10  Clinical Social Work is seeking post-discharge placement for this patient at the [place] level of care (*CSW will initial, date and re-position this form in  chart as items are completed):  Yes   Patient/family provided with [facility] Work Department's list of facilities offering this level of care within the geographic area requested by the patient (or if unable, by the patient's family).  Yes   Patient/family informed of their freedom to choose among providers that offer the needed level of care, that participate in Medicare, Medicaid or managed care program needed by the patient, have an available bed and are willing to accept the patient.  Yes   Patient/family informed of [facility]'s ownership interest in [facility] and [facility], as well as of the fact that they are under no obligation to receive care at these facilities.  PASRR submitted to EDS on       PASRR number received on       Existing PASRR number confirmed on 06/08/15     FL2 transmitted to all facilities in geographic area requested by pt/family on 06/01/15     FL2 transmitted to all facilities within larger geographic area on       Patient informed that his/her managed care company has contracts with or will negotiate with certain facilities, including the following:        Yes   Patient/family informed of bed offers received.  Patient chooses bed at  [facility])     Physician recommends and patient chooses bed at  [facility])    Patient to be transferred to  [facility]) on 06/15/15.  Patient to be transferred to facility by  (EMS)     Patient family notified on 06/15/15 of transfer.  Name of family member notified:   (patient's son)     PHYSICIAN Please sign FL2     Additional Comment:     _______________________________________________ Shela Leff, LCSW 06/15/2015, 2:05 PM

## 2015-06-15 NOTE — Care Management Important Message (Signed)
Important Message  Patient Details  Name: Sharon Kirby MRN: [id number] Date of Birth: [birth year]   Medicare Important Message Given:  Yes    [name] [name] 06/15/2015, 10:39 AM

## 2015-06-15 NOTE — Progress Notes (Signed)
Rectal tube leaking coming out of pt's rectum, prime doc paged orders given to remove, pt cleaned turned and repositioned

## 2015-06-16 DIAGNOSIS — E119 Type 2 diabetes mellitus without complications: Secondary | ICD-10-CM | POA: Diagnosis not present

## 2015-06-16 LAB — GLUCOSE, CAPILLARY
Glucose-Capillary: 137 mg/dL — ABNORMAL HIGH (ref 65–99)
Glucose-Capillary: 123 mg/dL — ABNORMAL HIGH (ref 65–99)

## 2015-06-17 LAB — GLUCOSE, CAPILLARY
GLUCOSE-CAPILLARY: 139 mg/dL — AB (ref 65–99)
GLUCOSE-CAPILLARY: 102 mg/dL — AB (ref 65–99)
GLUCOSE-CAPILLARY: 123 mg/dL — AB (ref 65–99)
Glucose-Capillary: 152 mg/dL — ABNORMAL HIGH (ref 65–99)
Glucose-Capillary: 148 mg/dL — ABNORMAL HIGH (ref 65–99)

## 2015-06-18 ENCOUNTER — Encounter
Admission: RE | Admit: 2015-06-18 | Discharge: 2015-06-18 | Disposition: A | Discharge status: Home / Self Care | Payer: Commercial Managed Care - HMO | Source: Ambulatory Visit | Attending: Internal Medicine | Admitting: Internal Medicine

## 2015-06-18 DIAGNOSIS — E119 Type 2 diabetes mellitus without complications: Secondary | ICD-10-CM | POA: Insufficient documentation

## 2015-06-18 DIAGNOSIS — Z792 Long term (current) use of antibiotics: Secondary | ICD-10-CM | POA: Diagnosis not present

## 2015-06-18 DIAGNOSIS — L0390 Cellulitis, unspecified: Secondary | ICD-10-CM | POA: Insufficient documentation

## 2015-06-18 LAB — GLUCOSE, CAPILLARY
GLUCOSE-CAPILLARY: 98 mg/dL (ref 65–99)
Glucose-Capillary: 82 mg/dL (ref 65–99)

## 2015-06-19 DIAGNOSIS — E119 Type 2 diabetes mellitus without complications: Secondary | ICD-10-CM | POA: Diagnosis not present

## 2015-06-19 LAB — GLUCOSE, CAPILLARY
GLUCOSE-CAPILLARY: 101 mg/dL — AB (ref 65–99)
GLUCOSE-CAPILLARY: 67 mg/dL (ref 65–99)
Glucose-Capillary: 78 mg/dL (ref 65–99)

## 2015-06-20 DIAGNOSIS — E119 Type 2 diabetes mellitus without complications: Secondary | ICD-10-CM | POA: Diagnosis not present

## 2015-06-20 LAB — GLUCOSE, CAPILLARY
GLUCOSE-CAPILLARY: 129 mg/dL — AB (ref 65–99)
GLUCOSE-CAPILLARY: 94 mg/dL (ref 65–99)
Glucose-Capillary: 127 mg/dL — ABNORMAL HIGH (ref 65–99)

## 2015-06-21 LAB — GLUCOSE, CAPILLARY
GLUCOSE-CAPILLARY: 119 mg/dL — AB (ref 65–99)
GLUCOSE-CAPILLARY: 85 mg/dL (ref 65–99)
Glucose-Capillary: 125 mg/dL — ABNORMAL HIGH (ref 65–99)
Glucose-Capillary: 112 mg/dL — ABNORMAL HIGH (ref 65–99)
Glucose-Capillary: 96 mg/dL (ref 65–99)

## 2015-06-22 DIAGNOSIS — E119 Type 2 diabetes mellitus without complications: Secondary | ICD-10-CM | POA: Diagnosis not present

## 2015-06-22 LAB — GLUCOSE, CAPILLARY
GLUCOSE-CAPILLARY: 90 mg/dL (ref 65–99)
GLUCOSE-CAPILLARY: 112 mg/dL — AB (ref 65–99)
GLUCOSE-CAPILLARY: 99 mg/dL (ref 65–99)

## 2015-06-23 ENCOUNTER — Other Ambulatory Visit: Payer: Self-pay | Admitting: *Deleted

## 2015-06-23 DIAGNOSIS — R7881 Bacteremia: Secondary | ICD-10-CM

## 2015-06-23 DIAGNOSIS — E119 Type 2 diabetes mellitus without complications: Secondary | ICD-10-CM | POA: Diagnosis not present

## 2015-06-23 LAB — COMPREHENSIVE METABOLIC PANEL
ALK PHOS: 364 U/L — AB (ref 38–126)
ALT: 10 U/L — AB (ref 14–54)
AST: 51 U/L — ABNORMAL HIGH (ref 15–41)
Albumin: 1.8 g/dL — ABNORMAL LOW (ref 3.5–5.0)
Anion gap: 4 — ABNORMAL LOW (ref 5–15)
BILIRUBIN TOTAL: 0.7 mg/dL (ref 0.3–1.2)
BUN: 11 mg/dL (ref 6–20)
CALCIUM: 8.6 mg/dL — AB (ref 8.9–10.3)
CO2: 22 mmol/L (ref 22–32)
CREATININE: 0.46 mg/dL (ref 0.44–1.00)
Chloride: 112 mmol/L — ABNORMAL HIGH (ref 101–111)
Glucose, Bld: 106 mg/dL — ABNORMAL HIGH (ref 65–99)
Potassium: 4.6 mmol/L (ref 3.5–5.1)
Sodium: 138 mmol/L (ref 135–145)
TOTAL PROTEIN: 6.4 g/dL — AB (ref 6.5–8.1)

## 2015-06-23 LAB — CBC WITH DIFFERENTIAL/PLATELET
Basophils Absolute: 0.1 10*3/uL (ref 0–0.1)
Basophils Relative: 1 %
EOS PCT: 4 %
Eosinophils Absolute: 0.2 10*3/uL (ref 0–0.7)
HEMATOCRIT: 28.3 % — AB (ref 35.0–47.0)
HEMOGLOBIN: 9.1 g/dL — AB (ref 12.0–16.0)
LYMPHS ABS: 0.9 10*3/uL — AB (ref 1.0–3.6)
LYMPHS PCT: 18 %
MCH: 30.3 pg (ref 26.0–34.0)
MCHC: 32.2 g/dL (ref 32.0–36.0)
MCV: 94.2 fL (ref 80.0–100.0)
Monocytes Absolute: 0.6 10*3/uL (ref 0.2–0.9)
Monocytes Relative: 12 %
Neutro Abs: 3.3 10*3/uL (ref 1.4–6.5)
Neutrophils Relative %: 65 %
PLATELETS: 140 10*3/uL — AB (ref 150–440)
RBC: 3.01 MIL/uL — AB (ref 3.80–5.20)
RDW: 18.5 % — ABNORMAL HIGH (ref 11.5–14.5)
WBC: 5 10*3/uL (ref 3.6–11.0)

## 2015-06-23 LAB — GLUCOSE, CAPILLARY
Glucose-Capillary: 107 mg/dL — ABNORMAL HIGH (ref 65–99)
Glucose-Capillary: 97 mg/dL (ref 65–99)

## 2015-06-23 LAB — VITAMIN B12: VITAMIN B 12: 1061 pg/mL — AB (ref 180–914)

## 2015-06-23 LAB — AMMONIA: AMMONIA: 44 umol/L — AB (ref 9–35)

## 2015-06-23 LAB — TSH: TSH: 4.17 u[IU]/mL (ref 0.350–4.500)

## 2015-06-23 NOTE — Patient Outreach (Signed)
[facility]) Care Management  06/23/2015  Sharon Kirby Sep 04, 1950 [id number]  Per EPIC patient was recently inpatient at [facility] from 11/19-11/28/2016 . Patient was discharged to [facility] in [place].  Spoke with Pam at facility [phone]) who verified that patient was in skilled section of nursing home. States contact person would be social Building surveyor at [phone]).  Plan: Will refer to care management assistant to refer to Clinical social worker.  Telephonic signing off.  Sherrin Daisy, RN BSN [facility] Management Coordinator [facility] Care Management  [phone]

## 2015-06-23 NOTE — Addendum Note (Signed)
Addended by: Dickie La on: 06/23/2015 04:20 PM   Modules accepted: Orders

## 2015-06-24 DIAGNOSIS — E119 Type 2 diabetes mellitus without complications: Secondary | ICD-10-CM | POA: Diagnosis not present

## 2015-06-24 LAB — VITAMIN D 25 HYDROXY (VIT D DEFICIENCY, FRACTURES): VIT D 25 HYDROXY: 29.1 ng/mL — AB (ref 30.0–100.0)

## 2015-06-24 LAB — GLUCOSE, CAPILLARY: Glucose-Capillary: 128 mg/dL — ABNORMAL HIGH (ref 65–99)

## 2015-06-25 DIAGNOSIS — E119 Type 2 diabetes mellitus without complications: Secondary | ICD-10-CM | POA: Diagnosis not present

## 2015-06-25 LAB — GLUCOSE, CAPILLARY
GLUCOSE-CAPILLARY: 113 mg/dL — AB (ref 65–99)
GLUCOSE-CAPILLARY: 83 mg/dL (ref 65–99)
GLUCOSE-CAPILLARY: 55 mg/dL — AB (ref 65–99)
GLUCOSE-CAPILLARY: 57 mg/dL — AB (ref 65–99)
GLUCOSE-CAPILLARY: 93 mg/dL (ref 65–99)
GLUCOSE-CAPILLARY: 87 mg/dL (ref 65–99)
Glucose-Capillary: 52 mg/dL — ABNORMAL LOW (ref 65–99)
Glucose-Capillary: 95 mg/dL (ref 65–99)

## 2015-06-26 DIAGNOSIS — E119 Type 2 diabetes mellitus without complications: Secondary | ICD-10-CM | POA: Diagnosis not present

## 2015-06-26 LAB — GLUCOSE, CAPILLARY
GLUCOSE-CAPILLARY: 77 mg/dL (ref 65–99)
Glucose-Capillary: 87 mg/dL (ref 65–99)
Glucose-Capillary: 88 mg/dL (ref 65–99)

## 2015-06-27 DIAGNOSIS — E119 Type 2 diabetes mellitus without complications: Secondary | ICD-10-CM | POA: Diagnosis not present

## 2015-06-27 LAB — GLUCOSE, CAPILLARY
GLUCOSE-CAPILLARY: 135 mg/dL — AB (ref 65–99)
Glucose-Capillary: 76 mg/dL (ref 65–99)

## 2015-06-28 DIAGNOSIS — E119 Type 2 diabetes mellitus without complications: Secondary | ICD-10-CM | POA: Diagnosis not present

## 2015-06-28 LAB — GLUCOSE, CAPILLARY
GLUCOSE-CAPILLARY: 124 mg/dL — AB (ref 65–99)
Glucose-Capillary: 63 mg/dL — ABNORMAL LOW (ref 65–99)

## 2015-06-29 DIAGNOSIS — E119 Type 2 diabetes mellitus without complications: Secondary | ICD-10-CM | POA: Diagnosis not present

## 2015-06-29 LAB — GLUCOSE, CAPILLARY
GLUCOSE-CAPILLARY: 120 mg/dL — AB (ref 65–99)
GLUCOSE-CAPILLARY: 61 mg/dL — AB (ref 65–99)

## 2015-06-30 DIAGNOSIS — E119 Type 2 diabetes mellitus without complications: Secondary | ICD-10-CM | POA: Diagnosis not present

## 2015-06-30 LAB — GLUCOSE, CAPILLARY
Glucose-Capillary: 92 mg/dL (ref 65–99)
Glucose-Capillary: 78 mg/dL (ref 65–99)

## 2015-07-01 LAB — GLUCOSE, CAPILLARY
GLUCOSE-CAPILLARY: 130 mg/dL — AB (ref 65–99)
GLUCOSE-CAPILLARY: 71 mg/dL (ref 65–99)
Glucose-Capillary: 85 mg/dL (ref 65–99)
Glucose-Capillary: 82 mg/dL (ref 65–99)

## 2015-07-02 ENCOUNTER — Other Ambulatory Visit: Payer: Self-pay | Admitting: *Deleted

## 2015-07-02 NOTE — Patient Outreach (Signed)
[facility]) Care Management  07/02/2015  Rain A Kirchberg 1950/11/01 [id number]  This social worker went to follow up with patient at [facility].  Spoke with the discharge planner-Ashley [phone]  who confirmed that patient discharged home on 07/01/15 with [facility].    Case to be closed to [facility] care management at this time as patient is being followed by [place]. RNCM and patient's provider to be notified.    Sheralyn Boatman [facility] Care Management [phone]

## 2015-07-19 DEATH — deceased

## 2017-01-16 IMAGING — CR DG KNEE COMPLETE 4+V*R*
4 series · 4 of 4 positions shown · non-contrast
Comparison: None.

CLINICAL DATA: Fall onto right knee 3 days ago with severe pain
with weight-bearing. Initial encounter.

EXAM:
RIGHT KNEE - COMPLETE 4+ VIEW

[knee ap]
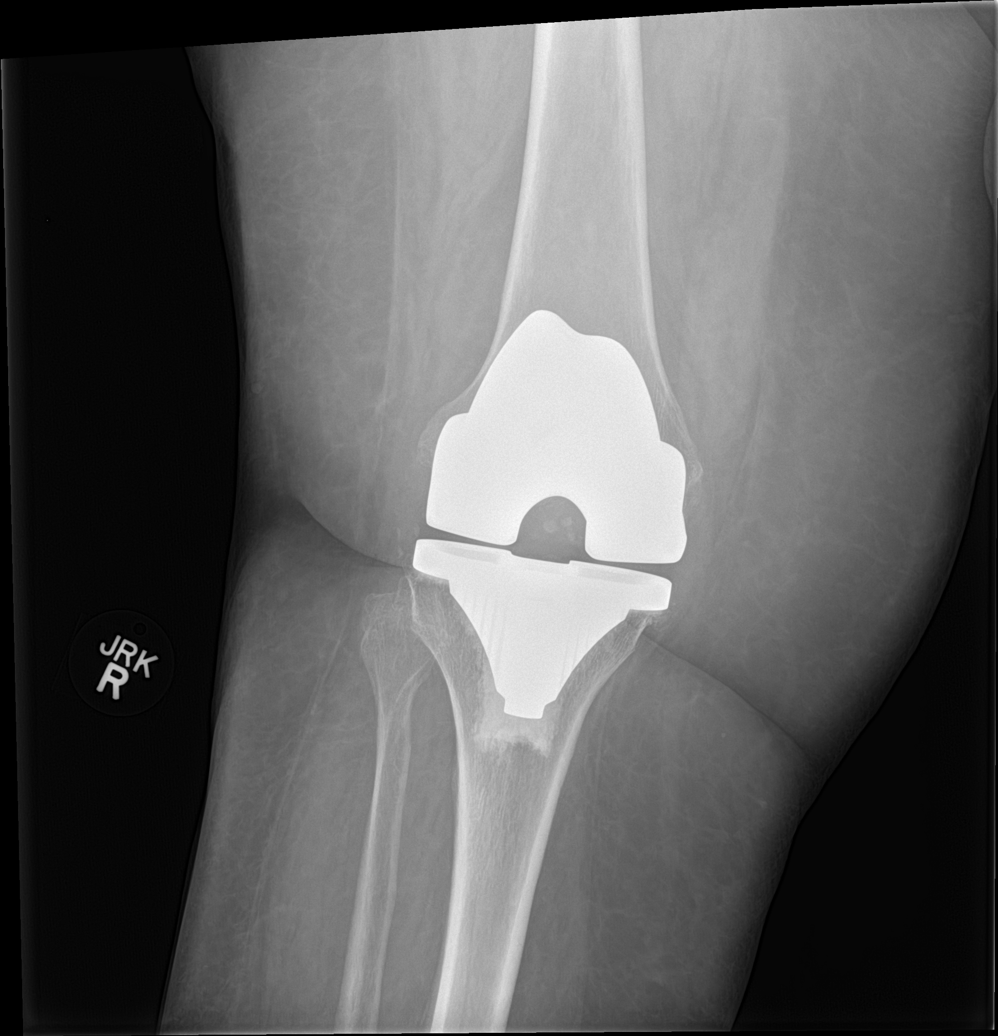

[knee obl (1 of 2)]
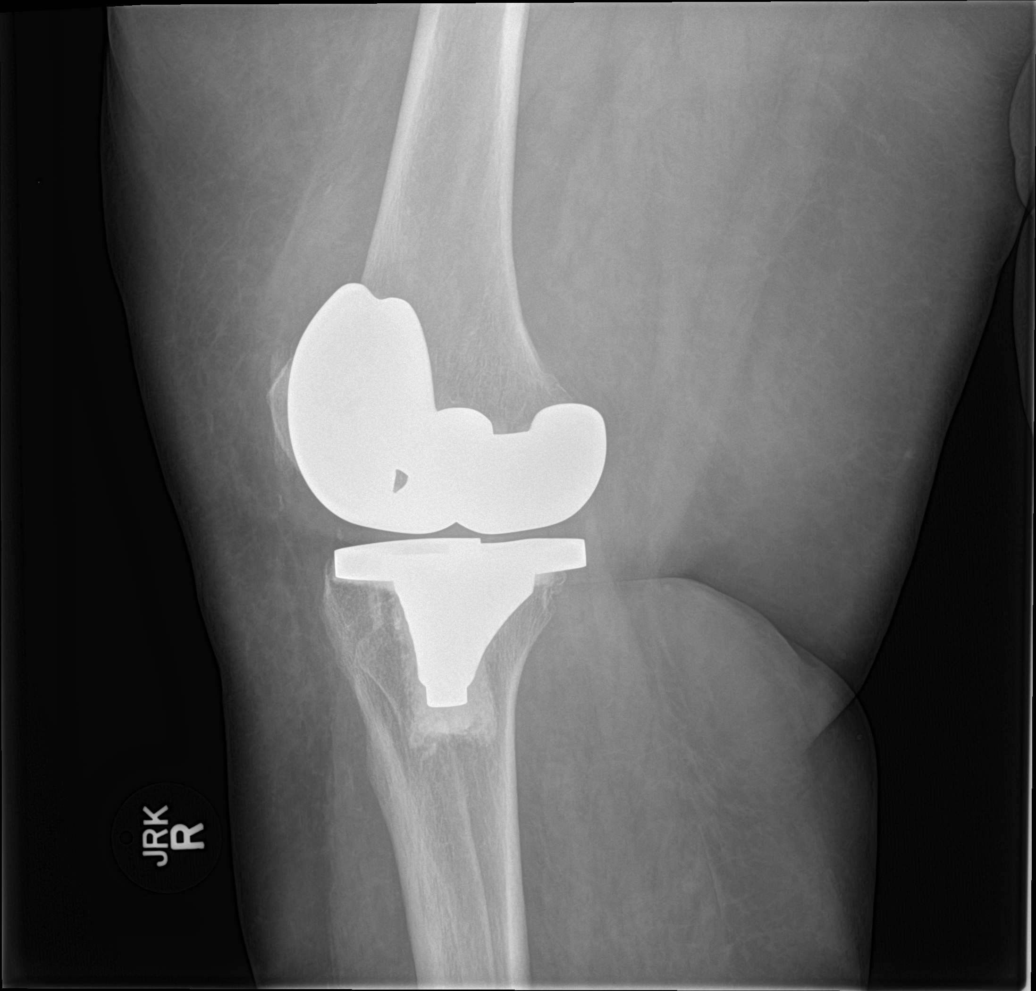

[knee obl (2 of 2)]
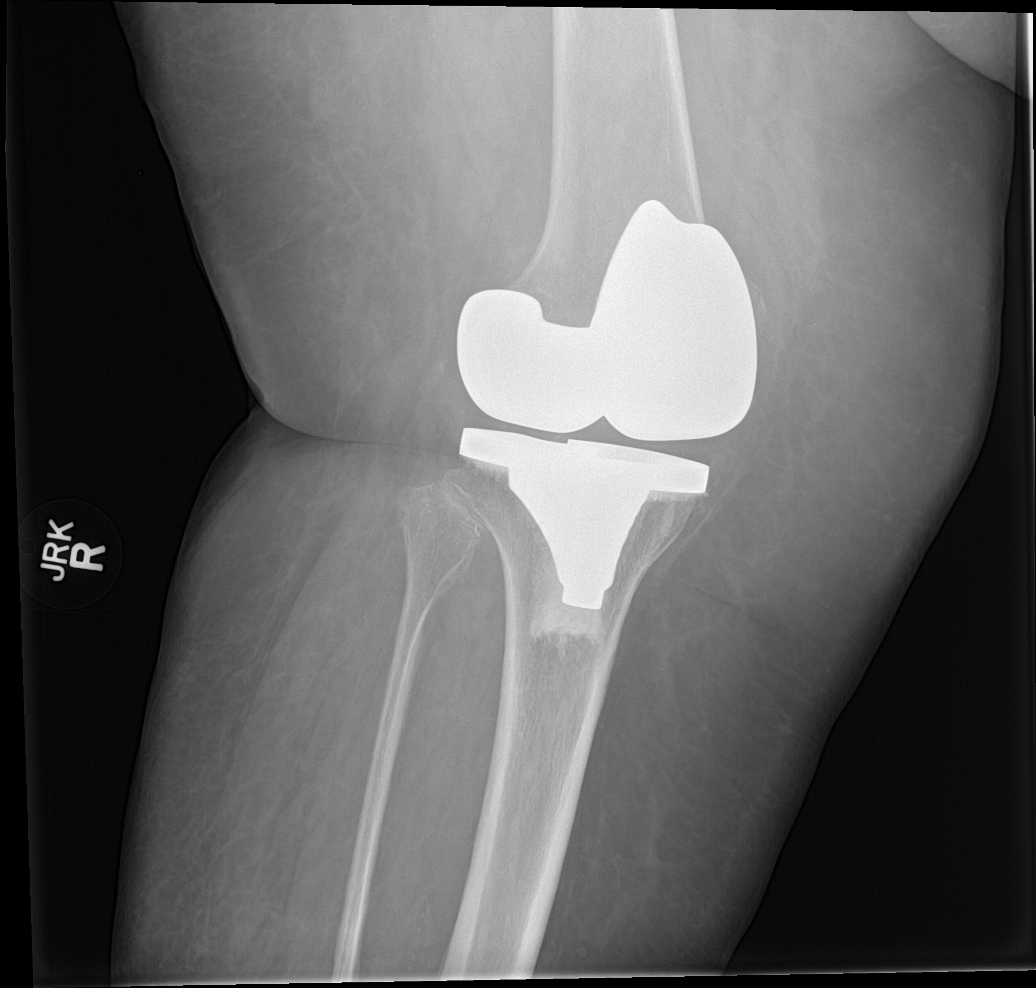

[knee lat]
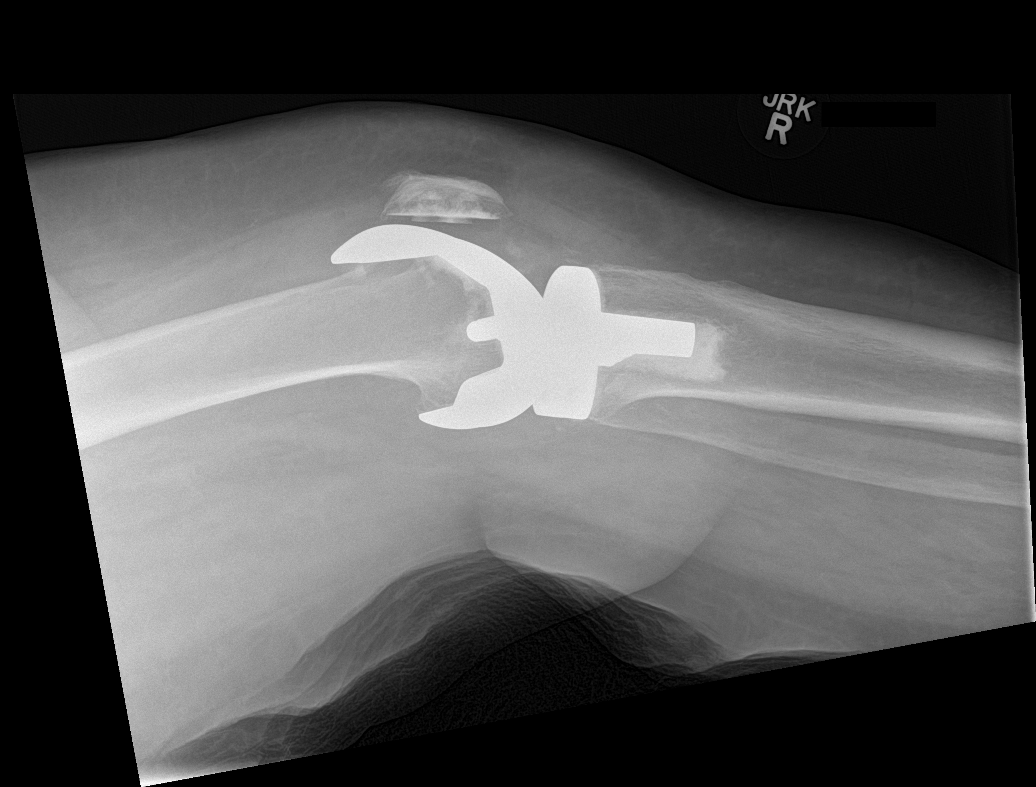

[4 of 4 positions shown; findings below may reference images not displayed]

FINDINGS: A total knee arthroplasty is well seated. No periprosthetic fracture
or malalignment. No evidence of joint effusion.

Osteopenic appearance of the bones.
IMPRESSION: 1. No acute finding.
2. Unremarkable total knee arthroplasty.

## 2017-01-18 IMAGING — CT CT ANGIO CHEST
1 of 2 series · 18 of 30 positions shown · IV contrast (APPLIED)
Comparison: Chest radiograph 06/02/2015

CLINICAL DATA: Shortness of breath with elevated troponin and back
pain.

EXAM:
CT ANGIOGRAPHY CHEST WITH CONTRAST
TECHNIQUE: Multidetector CT imaging of the chest was performed using the
standard protocol during bolus administration of intravenous
contrast. Multiplanar CT image reconstructions and MIPs were
obtained to evaluate the vascular anatomy.
CONTRAST:  75mL OMNIPAQUE IOHEXOL 350 MG/ML SOLN

[Series 5: pe 1.0 thins · axial · 0.90mm/px · z∈[-280,-38]mm · 18 of 273 slices shown]
[im 16/273  lung]
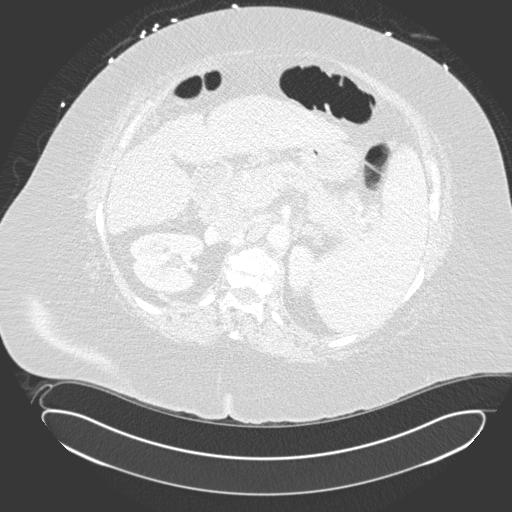
[im 31/273  mediastinal]
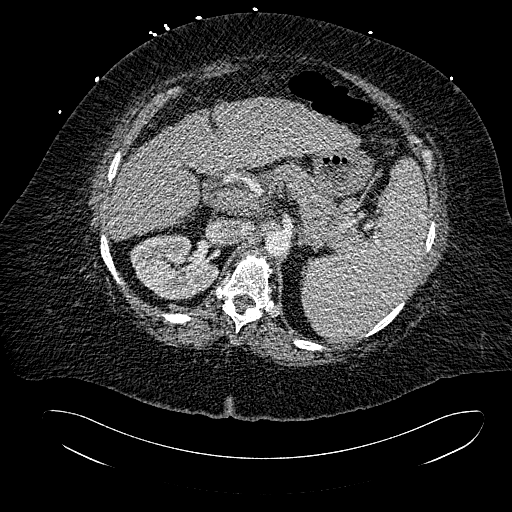
[im 46/273  lung]
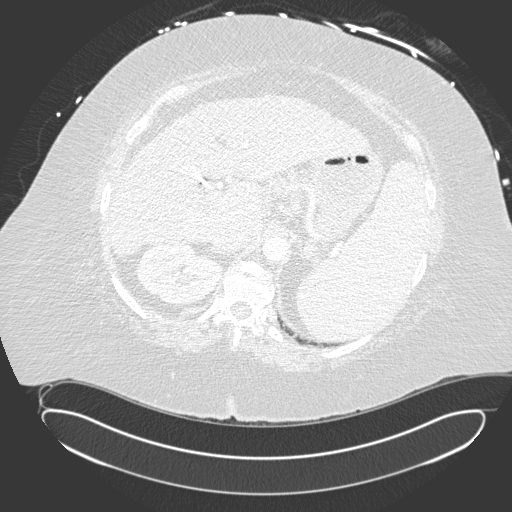
[im 61/273  mediastinal]
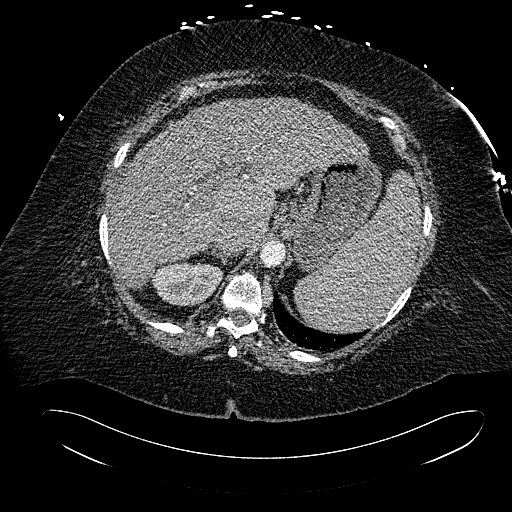
[im 76/273  lung]
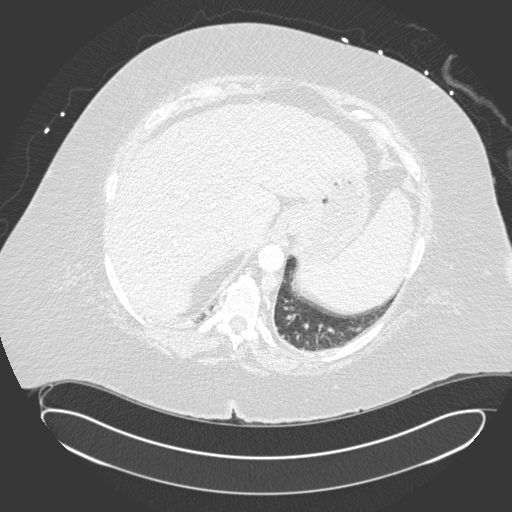
[im 91/273  mediastinal]
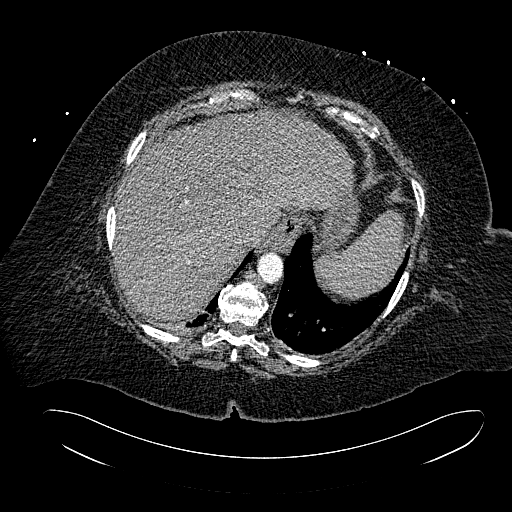
[im 106/273  lung]
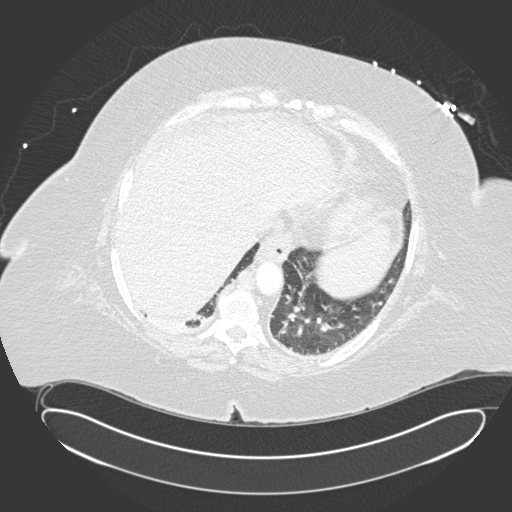
[im 121/273  mediastinal]
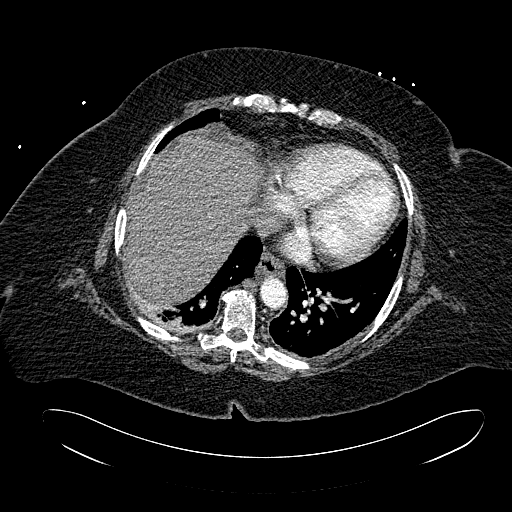
[im 127/273  lung]
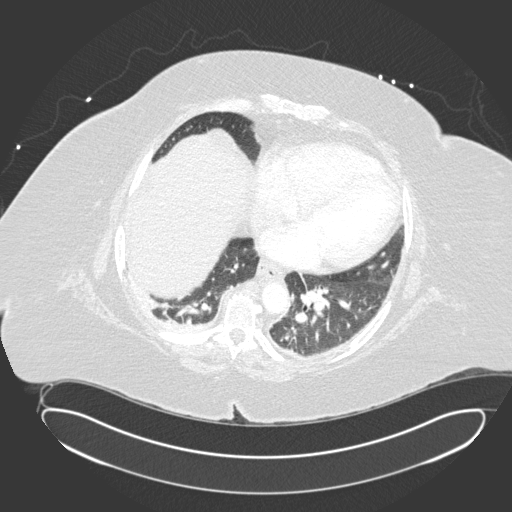
[im 137/273  mediastinal]
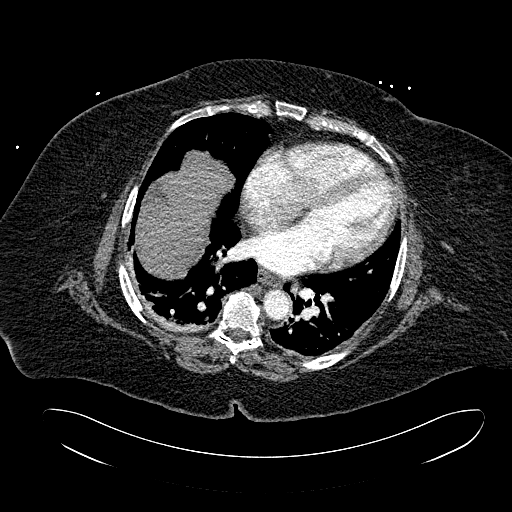
[im 152/273  lung]
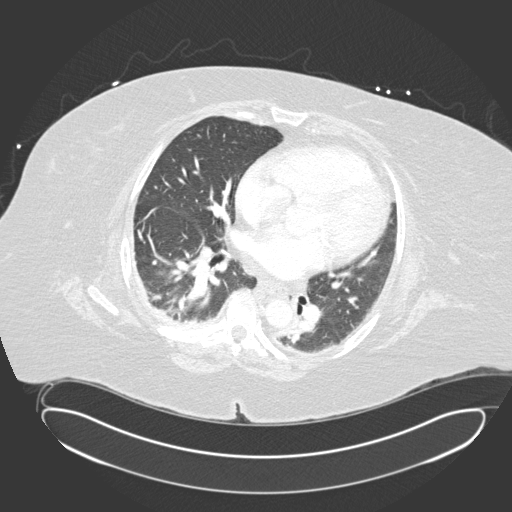
[im 167/273  mediastinal]
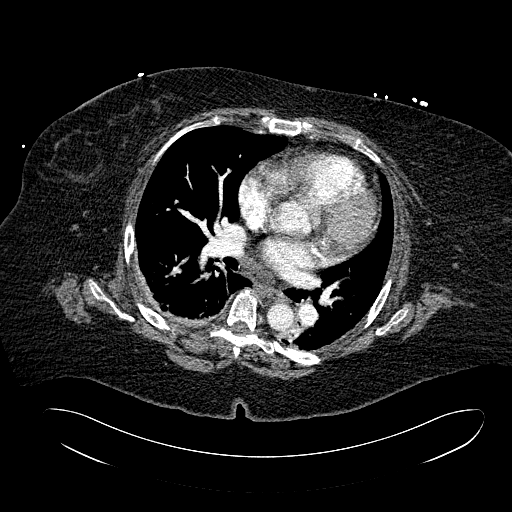
[im 182/273  lung]
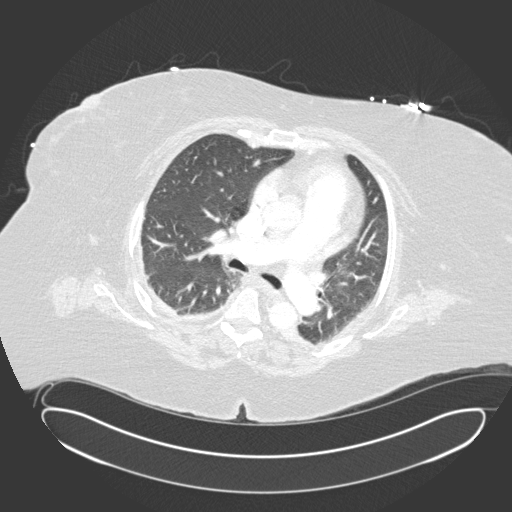
[im 197/273  mediastinal]
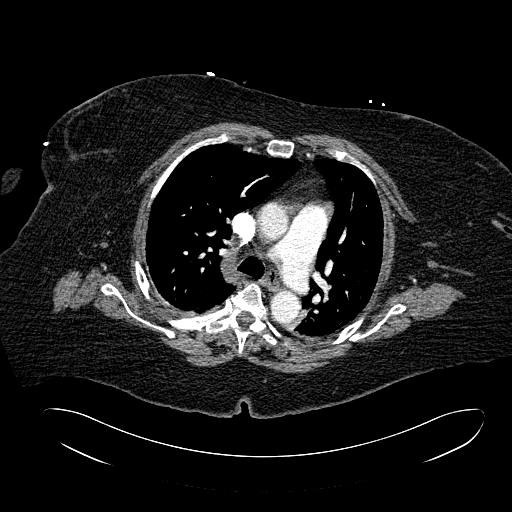
[im 212/273  lung]
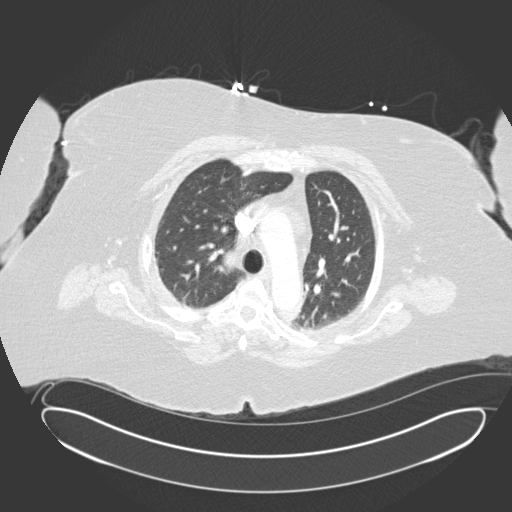
[im 227/273  mediastinal]
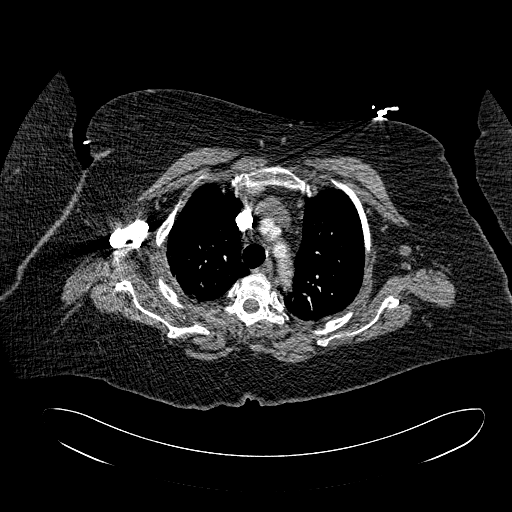
[im 242/273  lung]
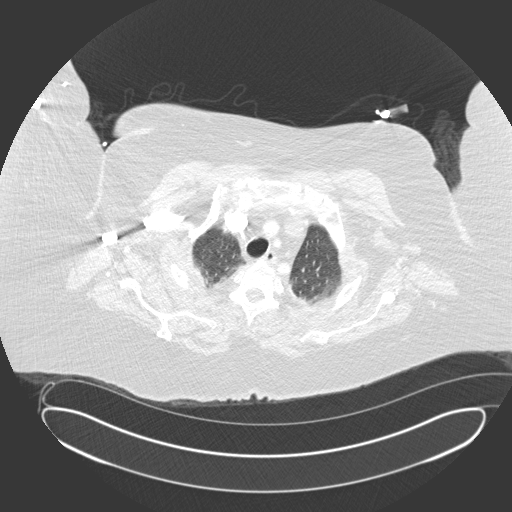
[im 257/273  mediastinal]
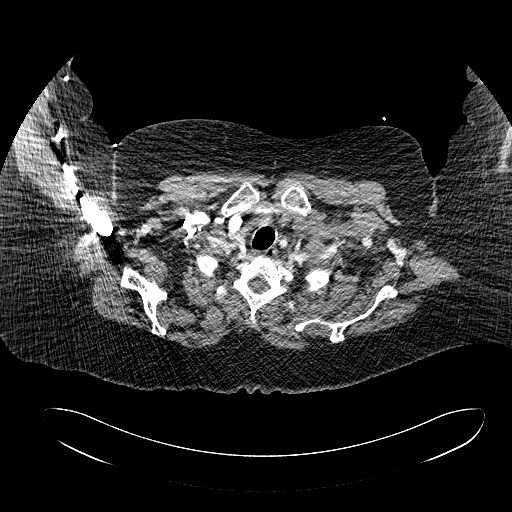

[18 of 30 positions shown; findings below may reference images not displayed]

FINDINGS: No evidence for a pulmonary embolism. No significant pericardial or
pleural fluid. Subcarinal tissue measures up to 1.3 cm in the short
axis. Otherwise, there is no significant chest lymphadenopathy.

There is a small amount of perihepatic ascites and the liver has a
nodular contour. In addition, the spleen is enlarged measuring
cm in the AP dimension. Gallbladder has been removed. Small lymph
nodes versus varices in the gastrohepatic ligament region. Cannot
exclude small esophageal varices. Limited evaluation of the portal
venous system on this examination.

The trachea and mainstem bronchi are patent. Volume loss in the
right lower lobe. There is also volume loss in the left lower lobe.
No significant airspace disease or consolidation in the lungs.

Degenerative changes at the glenohumeral joints are incompletely
visualized. No acute bone abnormality.

Review of the MIP images confirms the above findings.
IMPRESSION: No evidence for a pulmonary embolism.

Volume loss in the lower lobes, right side greater the left.

Cirrhosis with portal hypertension. Portal hypertension demonstrated
by perihepatic ascites, splenomegaly and possible varices.
